# Patient Record
Sex: Female | Born: 1935
Health system: Southern US, Community
[De-identification: ages and names within clinical notes are randomized; demographics above are authoritative.]

## PROBLEM LIST (undated history)

## (undated) DIAGNOSIS — C55 Malignant neoplasm of uterus, part unspecified: Secondary | ICD-10-CM

## (undated) DIAGNOSIS — Z9889 Other specified postprocedural states: Secondary | ICD-10-CM

## (undated) DIAGNOSIS — J449 Chronic obstructive pulmonary disease, unspecified: Secondary | ICD-10-CM

## (undated) DIAGNOSIS — I4891 Unspecified atrial fibrillation: Secondary | ICD-10-CM

## (undated) DIAGNOSIS — Z8719 Personal history of other diseases of the digestive system: Secondary | ICD-10-CM

## (undated) DIAGNOSIS — J984 Other disorders of lung: Secondary | ICD-10-CM

## (undated) DIAGNOSIS — B019 Varicella without complication: Secondary | ICD-10-CM

## (undated) DIAGNOSIS — E119 Type 2 diabetes mellitus without complications: Secondary | ICD-10-CM

## (undated) DIAGNOSIS — K219 Gastro-esophageal reflux disease without esophagitis: Secondary | ICD-10-CM

## (undated) DIAGNOSIS — H409 Unspecified glaucoma: Secondary | ICD-10-CM

## (undated) DIAGNOSIS — N6019 Diffuse cystic mastopathy of unspecified breast: Secondary | ICD-10-CM

## (undated) DIAGNOSIS — K432 Incisional hernia without obstruction or gangrene: Secondary | ICD-10-CM

## (undated) HISTORY — DX: Malignant neoplasm of uterus, part unspecified: C55

## (undated) HISTORY — DX: Other disorders of lung: J98.4

## (undated) HISTORY — PX: NISSEN FUNDOPLICATION: SHX2091

## (undated) HISTORY — DX: Chronic obstructive pulmonary disease, unspecified: J44.9

## (undated) HISTORY — DX: Unspecified atrial fibrillation: I48.91

## (undated) HISTORY — DX: Other specified postprocedural states: Z98.890

## (undated) HISTORY — DX: Gastro-esophageal reflux disease without esophagitis: K21.9

## (undated) HISTORY — DX: Type 2 diabetes mellitus without complications: E11.9

## (undated) HISTORY — DX: Varicella without complication: B01.9

## (undated) HISTORY — DX: Personal history of other diseases of the digestive system: Z87.19

## (undated) HISTORY — PX: CATARACT EXTRACTION: SUR2

## (undated) HISTORY — DX: Diffuse cystic mastopathy of unspecified breast: N60.19

## (undated) HISTORY — DX: Unspecified glaucoma: H40.9

## (undated) HISTORY — DX: Incisional hernia without obstruction or gangrene: K43.2

## (undated) HISTORY — PX: BREAST BIOPSY: SHX20

---

## 1964-01-23 HISTORY — PX: HIATAL HERNIA REPAIR: SHX195

## 1964-01-23 HISTORY — PX: APPENDECTOMY: SHX54

## 1972-01-23 HISTORY — PX: TOTAL ABDOMINAL HYSTERECTOMY W/ BILATERAL SALPINGOOPHORECTOMY: SHX83

## 1973-01-22 HISTORY — PX: CHOLECYSTECTOMY: SHX55

## 1975-01-23 DIAGNOSIS — N6019 Diffuse cystic mastopathy of unspecified breast: Secondary | ICD-10-CM

## 1975-01-23 HISTORY — DX: Diffuse cystic mastopathy of unspecified breast: N60.19

## 1980-01-23 DIAGNOSIS — Z9889 Other specified postprocedural states: Secondary | ICD-10-CM

## 1980-01-23 HISTORY — DX: Other specified postprocedural states: Z98.890

## 2004-01-23 DIAGNOSIS — Z8719 Personal history of other diseases of the digestive system: Secondary | ICD-10-CM

## 2004-01-23 HISTORY — DX: Personal history of other diseases of the digestive system: Z87.19

## 2004-01-23 HISTORY — PX: HERNIA REPAIR: SHX51

## 2014-07-08 LAB — BASIC METABOLIC PANEL
CREATININE: 0.6 mg/dL (ref 0.5–1.1)
GLUCOSE: 105 mg/dL
POTASSIUM: 4.8 mmol/L (ref 3.4–5.3)
Sodium: 144 mmol/L (ref 137–147)

## 2014-07-08 LAB — HEPATIC FUNCTION PANEL
ALT: 15 U/L (ref 7–35)
AST: 12 U/L — AB (ref 13–35)

## 2014-07-08 LAB — HEMOGLOBIN A1C: HEMOGLOBIN A1C: 5.7 % (ref 4.0–6.0)

## 2014-07-08 LAB — LIPID PANEL
CHOLESTEROL: 164 mg/dL (ref 0–200)
HDL: 43 mg/dL (ref 35–70)
LDL Cholesterol: 89 mg/dL
Triglycerides: 162 mg/dL — AB (ref 40–160)

## 2014-07-08 LAB — CBC AND DIFFERENTIAL
HEMOGLOBIN: 12 g/dL (ref 12.0–16.0)
WBC: 7.7 10^3/mL

## 2014-07-08 LAB — COMPREHENSIVE METABOLIC PANEL: CALCIUM: 10.6

## 2014-07-08 LAB — TSH: TSH: 4.25 u[IU]/mL (ref 0.41–5.90)

## 2014-07-08 LAB — VITAMIN D 25 HYDROXY (VIT D DEFICIENCY, FRACTURES): Vit D, 25-Hydroxy: 26.1

## 2014-07-08 LAB — VITAMIN B12: VITAMIN B12: 471

## 2014-08-23 ENCOUNTER — Ambulatory Visit: Payer: Self-pay | Admitting: Family Medicine

## 2014-08-24 ENCOUNTER — Ambulatory Visit (INDEPENDENT_AMBULATORY_CARE_PROVIDER_SITE_OTHER): Payer: Commercial Managed Care - HMO | Admitting: Family Medicine

## 2014-08-24 ENCOUNTER — Encounter: Payer: Self-pay | Admitting: Family Medicine

## 2014-08-24 VITALS — BP 154/71 | HR 77 | Ht 61.5 in | Wt 209.0 lb

## 2014-08-24 DIAGNOSIS — Z8719 Personal history of other diseases of the digestive system: Secondary | ICD-10-CM

## 2014-08-24 DIAGNOSIS — J45909 Unspecified asthma, uncomplicated: Secondary | ICD-10-CM | POA: Insufficient documentation

## 2014-08-24 DIAGNOSIS — E119 Type 2 diabetes mellitus without complications: Secondary | ICD-10-CM

## 2014-08-24 DIAGNOSIS — Z9889 Other specified postprocedural states: Secondary | ICD-10-CM | POA: Insufficient documentation

## 2014-08-24 DIAGNOSIS — J4540 Moderate persistent asthma, uncomplicated: Secondary | ICD-10-CM

## 2014-08-24 DIAGNOSIS — J986 Disorders of diaphragm: Secondary | ICD-10-CM | POA: Diagnosis not present

## 2014-08-24 DIAGNOSIS — I370 Nonrheumatic pulmonary valve stenosis: Secondary | ICD-10-CM

## 2014-08-24 DIAGNOSIS — Z8711 Personal history of peptic ulcer disease: Secondary | ICD-10-CM

## 2014-08-24 DIAGNOSIS — H4010X0 Unspecified open-angle glaucoma, stage unspecified: Secondary | ICD-10-CM

## 2014-08-24 DIAGNOSIS — I2723 Pulmonary hypertension due to lung diseases and hypoxia: Secondary | ICD-10-CM | POA: Insufficient documentation

## 2014-08-24 MED ORDER — AMBULATORY NON FORMULARY MEDICATION: Status: DC

## 2014-08-24 MED ORDER — ALBUTEROL SULFATE HFA 108 (90 BASE) MCG/ACT IN AERS: INHALATION_SPRAY | RESPIRATORY_TRACT | Status: DC

## 2014-08-24 NOTE — Progress Notes (Signed)
CC: Becky Newton is a 81 y.o. female is here for Establish Care   Subjective: HPI:   very pleasant 81 year old here to establish care   Reports a history of asthma  Currently taking Asmanex or Advair, is not entirely clear  What she actually hasn't home with respect to this inhaler. Also using  Albuterol a few times a week.  She had mild shortness of breath and wheezing earlier in her life however after  Her hiatal hernia slipped into her left lung cavity and  Repair resulted in left hemidiaphragm paralysis she has required 2 L of oxygen around-the-clock along with  Inhaled corticosteroids. She believes symptoms are well controlled right now.   reports a history of type 2 diabetes and believes that her A1c was checked within the last 3 months. She is currently taking 500 mg of metformin daily. Denies polyuria polyphagia or polydipsia. No outside blood  Sugars to report but does request refills on  Testing strips.   Decades ago before a Nissen fundoplication  She had an EGD showing which she reports is over 25 gastric ulcers the largest being 15 cm in diameter.  Since then she's been taking a PPI on a daily basis, currently taking 20 mg of omeprazole twice a day. She denies any gastric reflux symptoms or epigastric discomfort.   She is requesting a referral to an ophthalmologist. She was diagnosed with open angle glaucoma,  Duration of this is unknown. She denies any new headache or vision loss.  Review of Systems - General ROS: negative for - chills, fever, night sweats, weight gain or weight loss Ophthalmic ROS: negative for - decreased vision Psychological ROS: negative for - anxiety or depression ENT ROS: negative for - hearing change, nasal congestion, tinnitus or allergies Hematological and Lymphatic ROS: negative for - bleeding problems, bruising or swollen lymph nodes Breast ROS: negative Respiratory ROS: no cough, shortness of breath, or wheezing Cardiovascular ROS: no chest pain or  dyspnea on exertion Gastrointestinal ROS: no abdominal pain, change in bowel habits, or black or bloody stools Genito-Urinary ROS: negative for - genital discharge, genital ulcers, incontinence or abnormal bleeding from genitals Musculoskeletal ROS: negative for - joint pain or muscle pain Neurological ROS: negative for - headaches or memory loss Dermatological ROS: negative for lumps, mole changes, rash and skin lesion changes  Past Medical History  Diagnosis Date  . Diabetes   . Fibrocystic breast determined by biopsy 1977    Past Surgical History  Procedure Laterality Date  . Hiatal hernia repair  1966  . Appendectomy  1966  . Total abdominal hysterectomy w/ bilateral salpingoophorectomy  1974  . Cataract extraction  [id number]  . Hernia repair  2006   Family History  Problem Relation Age of Onset  . Breast cancer Mother   . Diabetes Mother     History   Social History  . Marital Status: Married    Spouse Name: N/A  . Number of Children: N/A  . Years of Education: N/A   Occupational History  . Not on file.   Social History Main Topics  . Smoking status: Former Smoker    Quit date: 08/23/1989  . Smokeless tobacco: Not on file  . Alcohol Use: No  . Drug Use: No  . Sexual Activity:    Partners: Male   Other Topics Concern  . Not on file   Social History Narrative  . No narrative on file     Objective: BP 154/71 mmHg  Pulse 77  Ht 5' 1.5" (1.562 m)  Wt 209 lb (94.802 kg)  BMI 38.86 kg/m2  General: Alert and Oriented, No Acute Distress HEENT: Pupils equal, round, reactive to light. Conjunctivae clear.  Moist mucous membranes pharynx unremarkable Lungs: Clear to auscultation bilaterally, no wheezing/ronchi/rales.  Comfortable work of breathing. Good air movement. Cardiac: Regular rate and rhythm. Normal S1/S2.  No murmurs, rubs, nor gallops.   Abdomen:obese and soft Extremities: No peripheral edema.  Strong peripheral pulses.  Mental Status: No depression,  anxiety, nor agitation. Skin: Warm and dry.  Assessment & Plan: [name] was seen today for establish care.  Diagnoses and all orders for this visit:  Asthma, chronic, moderate persistent, uncomplicated Orders: -     albuterol (PROVENTIL HFA;VENTOLIN HFA) 108 (90 BASE) MCG/ACT inhaler; Inhale two puffs every 4-6 hours only as needed for shortness of breath or wheezing. -     Ambulatory referral to Pulmonology  Type 2 diabetes mellitus without complication  History of gastric ulcer  Paralysis, diaphragm  History of Nissen fundoplication  Pulmonary stenosis Orders: -     Ambulatory referral to Pulmonology  Open-angle glaucoma, stage unspecified Orders: -     Ambulatory referral to Ophthalmology  Other orders -     AMBULATORY NON FORMULARY MEDICATION; Accu-Chek SmartView glucose testing strips. Use to check blood sugar twice a day.  Type 2 diabetes.   Asthma: Controlled continue as needed albuterol, requested that she bring her medicines at next visit to clarify whether she is taking Asmanex or Advair Type 2 diabetes: Clinically controlled, requested outside records for most recent and historically A1c's. History of gastric ulcer: Currently controlled,asymptomatic continue omeprazole Pulmonary stenosis: Referral to pulmonology Glaucoma: Referral to ophthalmology  Return in about 3 months (around 11/24/2014).

## 2014-08-26 ENCOUNTER — Ambulatory Visit: Payer: Self-pay | Admitting: Family Medicine

## 2014-09-02 ENCOUNTER — Other Ambulatory Visit: Payer: Self-pay | Admitting: Family Medicine

## 2014-09-02 ENCOUNTER — Encounter: Payer: Self-pay | Admitting: Family Medicine

## 2014-09-02 DIAGNOSIS — G4733 Obstructive sleep apnea (adult) (pediatric): Secondary | ICD-10-CM | POA: Insufficient documentation

## 2014-09-02 DIAGNOSIS — J9610 Chronic respiratory failure, unspecified whether with hypoxia or hypercapnia: Secondary | ICD-10-CM | POA: Insufficient documentation

## 2014-09-02 DIAGNOSIS — J4540 Moderate persistent asthma, uncomplicated: Secondary | ICD-10-CM

## 2014-09-04 DIAGNOSIS — J449 Chronic obstructive pulmonary disease, unspecified: Secondary | ICD-10-CM | POA: Diagnosis not present

## 2014-09-26 ENCOUNTER — Emergency Department
Admission: EM | Admit: 2014-09-26 | Discharge: 2014-09-26 | Disposition: A | Discharge status: Home / Self Care | Payer: Commercial Managed Care - HMO | Source: Home / Self Care | Attending: Family Medicine | Admitting: Family Medicine

## 2014-09-26 ENCOUNTER — Encounter: Payer: Self-pay | Admitting: Emergency Medicine

## 2014-09-26 DIAGNOSIS — W1839XA Other fall on same level, initial encounter: Secondary | ICD-10-CM | POA: Diagnosis not present

## 2014-09-26 DIAGNOSIS — M79672 Pain in left foot: Secondary | ICD-10-CM

## 2014-09-26 DIAGNOSIS — W010XXA Fall on same level from slipping, tripping and stumbling without subsequent striking against object, initial encounter: Secondary | ICD-10-CM

## 2014-09-26 NOTE — ED Provider Notes (Signed)
CSN: [id number]     Arrival date & time 09/26/14  1656 History   First MD Initiated Contact with Patient 09/26/14 1710     Chief Complaint  Patient presents with  . Foot Injury   (Consider location/radiation/quality/duration/timing/severity/associated sxs/prior Treatment) HPI  Pt is a 81yo female presenting to [facility] with c/o Left foot pain and swelling that started around 12PM this afternoon after she tripped and fell on a curb on a sidewalk while going to lunch. Pt states she well back on her buttock.  Left foot has mild swelling and mild tenderness that is worse in 3rd toe. Pt states she does not believe it is broken. Denies taking pain medication PTA.  She has been icing her foot with moderate relief.  Pt is on 60m aspirin daily. Denies any other injuries.   Past Medical History  Diagnosis Date  . Diabetes   . Fibrocystic breast determined by biopsy 1977  . H/O left breast biopsy 1982  . H/O hernia repair 2006  . Incisional hernia   . S/P scar revision    Past Surgical History  Procedure Laterality Date  . Hiatal hernia repair  1966  . Appendectomy  1966  . Total abdominal hysterectomy w/ bilateral salpingoophorectomy  1974    hx of cancer   . Cataract extraction  [id number] . Hernia repair  2006   Family History  Problem Relation Age of Onset  . Breast cancer Mother   . Diabetes Mother    Social History  Substance Use Topics  . Smoking status: Former Smoker    Quit date: 08/23/1989  . Smokeless tobacco: None  . Alcohol Use: No   OB History    No data available     Review of Systems  Musculoskeletal: Positive for myalgias, joint swelling and arthralgias.       Left foot  Skin: Negative for color change and wound.    Allergies  Ivp dye  Home Medications   Prior to Admission medications   Medication Sig Start Date End Date Taking? Authorizing Provider  albuterol (PROVENTIL HFA;VENTOLIN HFA) 108 (90 BASE) MCG/ACT inhaler Inhale two puffs every 4-6 hours only as  needed for shortness of breath or wheezing. 08/24/14 08/24/15  Sean Hommel, DO  AMBULATORY NON FORMULARY MEDICATION Accu-Chek SmartView glucose testing strips. Use to check blood sugar twice a day.  Type 2 diabetes. 08/24/14   Sean Hommel, DO  Fluticasone-Salmeterol (ADVAIR DISKUS) 250-50 MCG/DOSE AEPB Inhale 1 puff into the lungs 2 (two) times daily. 09/02/14   Sean Hommel, DO  gabapentin (NEURONTIN) 300 MG capsule Take 300 mg by mouth 3 (three) times daily.    Historical Provider, MD  metFORMIN (GLUCOPHAGE) 500 MG tablet Take 500 mg by mouth daily with breakfast.    Historical Provider, MD  mometasone (ASMANEX) 220 MCG/INH inhaler Inhale 2 puffs into the lungs daily.    Historical Provider, MD  Multiple Vitamins-Minerals (CENTRUM SILVER ULTRA WOMENS PO) Take by mouth.    Historical Provider, MD  omeprazole (PRILOSEC) 20 MG capsule Take 20 mg by mouth 2 (two) times daily before a meal.    Historical Provider, MD  Polyethyl Glycol-Propyl Glycol (SYSTANE ULTRA OP) Apply to eye.    Historical Provider, MD  Travoprost, BAK Free, (TRAVATAN Z) 0.004 % SOLN ophthalmic solution 1 drop at bedtime.    Historical Provider, MD   Meds Ordered and Administered this Visit  Medications - No data to display  BP 149/71 mmHg  Pulse 71  Temp(Src) 97.6  F (36.4 C) (Oral)  SpO2 97% No data found.   Physical Exam  Constitutional: She is oriented to person, place, and time. She appears well-developed and well-nourished.  HENT:  Head: Normocephalic and atraumatic.  Eyes: EOM are normal.  Neck: Normal range of motion.  Cardiovascular: Normal rate.   Pulses:      Dorsalis pedis pulses are 2+ on the left side.  Left foot: cap refill < 3 seconds  Pulmonary/Chest: Effort normal.  Musculoskeletal: Normal range of motion. She exhibits edema and tenderness.  Left foot: mild edema over 2nd and 3rd metatarsal, mild tenderness over edema and base of 3rd toe. FROM Left ankle and toes. 5/5 strength with plantarflexion and  dorsiflexion.  Neurological: She is alert and oriented to person, place, and time.  Left foot: sensation normal.  Skin: Skin is warm and dry.  Left foot: mild edema and ecchymosis over 2nd and 3rd metatarsals, mild tenderness. Skin in tact. No warmth or erythema.  Psychiatric: She has a normal mood and affect. Her behavior is normal.  Nursing note and vitals reviewed.   ED Course  Procedures (including critical care time)  Labs Review Labs Reviewed - No data to display  Imaging Review No results found.    MDM   1. Left foot pain   2. Fall from slip, trip, or stumble, initial encounter     Pt c/o Left foot pain and swelling. Foot is neurovascularly in tact. Mild edema, ecchymosis and tenderness. FROM with 5/5 strength. Pt states she does not think it is broken and declined imaging at this time.   Encouraged pt to keep foot elevated and ice 2-3 times daily. Ace wrap applied in urgent care. Advised to f/u in Urgent Care or with PCP in 1 week if not improving as she may need plain films to r/o fracture if not improving. Patient verbalized understanding and agreement with treatment plan.     Noland Fordyce, PA-C 09/26/14 1718

## 2014-09-26 NOTE — ED Notes (Signed)
Left foot pain. Pt states she fell while getting out of the car today. States she is only having minimal discomfort.

## 2014-10-05 DIAGNOSIS — J449 Chronic obstructive pulmonary disease, unspecified: Secondary | ICD-10-CM | POA: Diagnosis not present

## 2014-10-14 ENCOUNTER — Ambulatory Visit (HOSPITAL_BASED_OUTPATIENT_CLINIC_OR_DEPARTMENT_OTHER)
Admission: RE | Admit: 2014-10-14 | Discharge: 2014-10-14 | Disposition: A | Discharge status: Home / Self Care | Payer: Commercial Managed Care - HMO | Source: Ambulatory Visit | Attending: Pulmonary Disease | Admitting: Pulmonary Disease

## 2014-10-14 ENCOUNTER — Encounter: Payer: Self-pay | Admitting: Pulmonary Disease

## 2014-10-14 ENCOUNTER — Ambulatory Visit (INDEPENDENT_AMBULATORY_CARE_PROVIDER_SITE_OTHER): Payer: Commercial Managed Care - HMO | Admitting: Pulmonary Disease

## 2014-10-14 VITALS — BP 124/70 | HR 73 | Ht 61.5 in | Wt 210.0 lb

## 2014-10-14 DIAGNOSIS — I517 Cardiomegaly: Secondary | ICD-10-CM | POA: Insufficient documentation

## 2014-10-14 DIAGNOSIS — J90 Pleural effusion, not elsewhere classified: Secondary | ICD-10-CM | POA: Diagnosis not present

## 2014-10-14 DIAGNOSIS — J4530 Mild persistent asthma, uncomplicated: Secondary | ICD-10-CM

## 2014-10-14 DIAGNOSIS — J986 Disorders of diaphragm: Secondary | ICD-10-CM

## 2014-10-14 DIAGNOSIS — J9611 Chronic respiratory failure with hypoxia: Secondary | ICD-10-CM | POA: Diagnosis not present

## 2014-10-14 DIAGNOSIS — I272 Other secondary pulmonary hypertension: Secondary | ICD-10-CM | POA: Diagnosis not present

## 2014-10-14 DIAGNOSIS — R918 Other nonspecific abnormal finding of lung field: Secondary | ICD-10-CM | POA: Insufficient documentation

## 2014-10-14 DIAGNOSIS — R0602 Shortness of breath: Secondary | ICD-10-CM | POA: Diagnosis not present

## 2014-10-14 DIAGNOSIS — I2723 Pulmonary hypertension due to lung diseases and hypoxia: Secondary | ICD-10-CM

## 2014-10-14 DIAGNOSIS — J984 Other disorders of lung: Secondary | ICD-10-CM

## 2014-10-14 NOTE — Assessment & Plan Note (Signed)
Continue 2 L oxygen on exertion and sleep

## 2014-10-14 NOTE — Assessment & Plan Note (Signed)
She will change from Advair to Asmanex Okay to use Ventolin instead of pro-air

## 2014-10-14 NOTE — Assessment & Plan Note (Signed)
Obtain chest x-ray today

## 2014-10-14 NOTE — Assessment & Plan Note (Signed)
Echo will be obtained to document pulmonary artery pressures. The seems to be secondary to her lung disease and oxygen as the main treatment

## 2014-10-14 NOTE — Patient Instructions (Signed)
CXR today Echo for pulmonary hypertension Call as needed

## 2014-10-14 NOTE — Progress Notes (Signed)
Subjective:    Patient ID: Becky Newton, female    DOB: Mar 02, 1933, 81 y.o.   MRN: [id number]  HPI   81 year old remote smoker who has moved from [place], presents to establish care. She is to follow-up with a pulmonologist there Dr Steele Berg for the following problems- 1. Chronic respiratory failure requiring oxygen since 2013. ONO 11/2011 on room air shows 4.5 hours of desaturation less than 89% 2. Left hemidiaphragm paralysis-documented on sniff test. She apparently had hiatal hernia surgery in 1976 and then some kind of diaphragmatic repair in the 90s by Dr. Arlyce Dice 3. Cough variant Asthma -maintained on Advair and pro-air 4. Mild OSA-AHI 12/hour, did not tolerate CPAP. 5. Pulmonary hypertension-no details available  She states that due to insurance issues she will be switching from Advair to Asmanex and from pro-air 2 Ventolin. She is compliant with oxygen and her son Elta Guadeloupe reports significant improvement in her dyspnea and exercise capacity since she went on oxygen few years ago. She denies cough or wheezing. She absolutely did not tolerate CPAP and does not want to try again. She denies pedal edema. Her immunizations are up-to-date She smoked about 25 pack years before she quit in 1991  CXr 10/2012-shows left lower lobe scarring  Past Medical History  Diagnosis Date  . Diabetes   . Fibrocystic breast determined by biopsy 1977  . H/O left breast biopsy 1982  . H/O hernia repair 2006  . Incisional hernia   . S/P scar revision     Past Surgical History  Procedure Laterality Date  . Hiatal hernia repair  1966  . Appendectomy  1966  . Total abdominal hysterectomy w/ bilateral salpingoophorectomy  1974    hx of cancer   . Cataract extraction  [id number]  . Hernia repair  2006   Allergies  Allergen Reactions  . Ivp Dye [Iodinated Diagnostic Agents]     Social History   Social History  . Marital Status: Married    Spouse Name: N/A  . Number of Children: N/A   . Years of Education: N/A   Occupational History  . Not on file.   Social History Main Topics  . Smoking status: Former Smoker    Quit date: 08/23/1989  . Smokeless tobacco: Not on file  . Alcohol Use: No  . Drug Use: No  . Sexual Activity:    Partners: Male   Other Topics Concern  . Not on file   Social History Narrative    Family History  Problem Relation Age of Onset  . Breast cancer Mother   . Diabetes Mother     Review of Systems  Constitutional: Negative for fever, chills and unexpected weight change.  HENT: Negative for congestion, dental problem, ear pain, nosebleeds, postnasal drip, rhinorrhea, sinus pressure, sneezing, sore throat, trouble swallowing and voice change.   Eyes: Negative for visual disturbance.  Respiratory: Positive for shortness of breath. Negative for cough and choking.   Cardiovascular: Negative for chest pain and leg swelling.  Gastrointestinal: Negative for vomiting, abdominal pain and diarrhea.  Genitourinary: Negative for difficulty urinating.  Musculoskeletal: Negative for arthralgias.  Skin: Negative for rash.  Neurological: Negative for tremors, syncope and headaches.  Hematological: Does not bruise/bleed easily.       Objective:   Physical Exam  Gen. Pleasant, obese, in no distress, normal affect ENT - no lesions, no post nasal drip, class 2-3 airway Neck: No JVD, no thyromegaly, no carotid bruits Lungs: no use of accessory muscles, no dullness  to percussion, decreased left base without rales or rhonchi  Cardiovascular: Rhythm regular, heart sounds  normal, no murmurs or gallops, no peripheral edema Abdomen: soft and non-tender, no hepatosplenomegaly, BS normal. Musculoskeletal: No deformities, no cyanosis or clubbing Neuro:  alert, non focal, no tremors       Assessment & Plan:

## 2014-10-15 ENCOUNTER — Other Ambulatory Visit: Payer: Self-pay | Admitting: Pulmonary Disease

## 2014-10-15 ENCOUNTER — Telehealth: Payer: Self-pay | Admitting: Pulmonary Disease

## 2014-10-15 MED ORDER — FUROSEMIDE 20 MG PO TABS: ORAL_TABLET | ORAL | Status: DC

## 2014-10-15 NOTE — Telephone Encounter (Signed)
FYI - patient's last surgery was 1998 with Dr. Rudi Heap  Patient called to give you this information. Nothing further needed.   FYI to Dr. Elsworth Soho

## 2014-10-16 NOTE — Telephone Encounter (Signed)
ok

## 2014-10-18 DIAGNOSIS — E119 Type 2 diabetes mellitus without complications: Secondary | ICD-10-CM | POA: Diagnosis not present

## 2014-10-18 DIAGNOSIS — H5210 Myopia, unspecified eye: Secondary | ICD-10-CM | POA: Diagnosis not present

## 2014-10-18 DIAGNOSIS — H5213 Myopia, bilateral: Secondary | ICD-10-CM | POA: Diagnosis not present

## 2014-10-18 LAB — HM DIABETES EYE EXAM

## 2014-10-19 DIAGNOSIS — I519 Heart disease, unspecified: Secondary | ICD-10-CM | POA: Diagnosis not present

## 2014-10-19 DIAGNOSIS — R072 Precordial pain: Secondary | ICD-10-CM | POA: Diagnosis not present

## 2014-10-19 DIAGNOSIS — I480 Paroxysmal atrial fibrillation: Secondary | ICD-10-CM | POA: Diagnosis not present

## 2014-10-19 DIAGNOSIS — H409 Unspecified glaucoma: Secondary | ICD-10-CM | POA: Diagnosis not present

## 2014-10-19 DIAGNOSIS — Z9981 Dependence on supplemental oxygen: Secondary | ICD-10-CM | POA: Diagnosis not present

## 2014-10-19 DIAGNOSIS — J449 Chronic obstructive pulmonary disease, unspecified: Secondary | ICD-10-CM | POA: Diagnosis not present

## 2014-10-19 DIAGNOSIS — I4891 Unspecified atrial fibrillation: Secondary | ICD-10-CM | POA: Diagnosis not present

## 2014-10-19 DIAGNOSIS — E1142 Type 2 diabetes mellitus with diabetic polyneuropathy: Secondary | ICD-10-CM | POA: Diagnosis not present

## 2014-10-19 DIAGNOSIS — N6019 Diffuse cystic mastopathy of unspecified breast: Secondary | ICD-10-CM | POA: Diagnosis not present

## 2014-10-19 DIAGNOSIS — J90 Pleural effusion, not elsewhere classified: Secondary | ICD-10-CM | POA: Diagnosis not present

## 2014-10-19 DIAGNOSIS — I088 Other rheumatic multiple valve diseases: Secondary | ICD-10-CM | POA: Diagnosis not present

## 2014-10-19 DIAGNOSIS — J9611 Chronic respiratory failure with hypoxia: Secondary | ICD-10-CM | POA: Diagnosis not present

## 2014-10-19 DIAGNOSIS — R918 Other nonspecific abnormal finding of lung field: Secondary | ICD-10-CM | POA: Diagnosis not present

## 2014-10-19 DIAGNOSIS — I517 Cardiomegaly: Secondary | ICD-10-CM | POA: Diagnosis not present

## 2014-10-20 ENCOUNTER — Other Ambulatory Visit (HOSPITAL_BASED_OUTPATIENT_CLINIC_OR_DEPARTMENT_OTHER): Payer: Commercial Managed Care - HMO

## 2014-10-22 ENCOUNTER — Encounter: Payer: Self-pay | Admitting: Family Medicine

## 2014-10-28 ENCOUNTER — Telehealth: Payer: Self-pay | Admitting: *Deleted

## 2014-10-28 MED ORDER — METFORMIN HCL 500 MG PO TABS: 500.0000 mg | ORAL_TABLET | Freq: Every day | ORAL | Status: DC

## 2014-10-28 NOTE — Telephone Encounter (Signed)
Pt is requesting metformin be sent to her pharm. Have to rout since we have not rx'd this for her yet

## 2014-10-28 NOTE — Telephone Encounter (Signed)
Pt notified

## 2014-10-28 NOTE — Telephone Encounter (Signed)
In her message she said she takes it once a day.

## 2014-10-28 NOTE — Telephone Encounter (Signed)
I'll need some clarification, in the [facility] system she's listed as taking 523m BID, in our system it's only 5044mdaily.  Can she let me know what she's actually taking?

## 2014-10-28 NOTE — Telephone Encounter (Signed)
90 day supply with refills sent to her [facility] mail order pharmacy.

## 2014-11-03 ENCOUNTER — Ambulatory Visit (INDEPENDENT_AMBULATORY_CARE_PROVIDER_SITE_OTHER): Payer: Commercial Managed Care - HMO | Admitting: Cardiology

## 2014-11-03 ENCOUNTER — Encounter: Payer: Self-pay | Admitting: Cardiology

## 2014-11-03 VITALS — BP 170/84 | HR 59 | Ht 61.5 in | Wt 211.4 lb

## 2014-11-03 DIAGNOSIS — I4891 Unspecified atrial fibrillation: Secondary | ICD-10-CM | POA: Diagnosis not present

## 2014-11-03 DIAGNOSIS — I480 Paroxysmal atrial fibrillation: Secondary | ICD-10-CM | POA: Diagnosis not present

## 2014-11-03 DIAGNOSIS — R079 Chest pain, unspecified: Secondary | ICD-10-CM

## 2014-11-03 DIAGNOSIS — R072 Precordial pain: Secondary | ICD-10-CM

## 2014-11-03 DIAGNOSIS — J986 Disorders of diaphragm: Secondary | ICD-10-CM

## 2014-11-03 NOTE — Assessment & Plan Note (Signed)
Patient had new onset atrial fibrillation recently.She converted to sinus. Echocardiogram showed normal LV function and TSH normal. Embolic risk factors include diabetes mellitus, female sex and age greater than 82. CHADS vasc 4. Continue xarelto. In 2 weeks check hemoglobin and renal function. Continue Toprol. If she has more frequent episodes in the future we will consider an antiarrhythmic.

## 2014-11-03 NOTE — Assessment & Plan Note (Signed)
Patient is on chronic oxygen for a paralyzed left hemidiaphragm, obesity hypoventilatory syndrome, obstructive sleep apnea and COPD. Management per primary care.

## 2014-11-03 NOTE — Assessment & Plan Note (Signed)
Patient had chest pain recently with her atrial fibrillation. She ruled out by serial enzymes. Plan nuclear study for risk stratification.

## 2014-11-03 NOTE — Patient Instructions (Signed)
Medication Instructions:   NO CHANGE  Labwork:  Your physician recommends that you return for lab work in: 2 WEEKS  Testing/Procedures:  Your physician has requested that you have a lexiscan myoview. For further information please visit [email or web address]. Please follow instruction sheet, as given.    Follow-Up:  Your physician recommends that you schedule a follow-up appointment in: [place]

## 2014-11-03 NOTE — Assessment & Plan Note (Signed)
Needs weight loss.

## 2014-11-03 NOTE — Progress Notes (Signed)
HPI: 81 year old female for evaluation of atrial fibrillation and chest pain. Patient was admitted to [facility] recently with new onset atrial fibrillation with rapid ventricular response. I do not have the electrocardiogram but I do have a discharge summary. She converted to sinus rhythm with Cardizem. Her enzymes were negative. She was ultimately discharged on Toprol and xarelto. TSH normal. Echocardiogram in [facility] September 2016 showed normal LV function, grade 2 diastolic dysfunction, moderate left atrial enlargement, mild tricuspid regurgitation and trace aortic insufficiency. Chest x-ray September 2016 showed probable edema and small effusions. Since discharge she has dyspnea on exertion but has no recurrent chest pain. Her only chest pain occurred with the atrial fibrillation/rapid ventricular response. Her dyspnea on exertion is chronic and she is on oxygen. No orthopnea, PND, pedal edema or syncope. No bleeding.  Current Outpatient Prescriptions  Medication Sig Dispense Refill  . AMBULATORY NON FORMULARY MEDICATION Accu-Chek SmartView glucose testing strips. Use to check blood sugar twice a day.  Type 2 diabetes. 100 Units 11  . Fluticasone-Salmeterol (ADVAIR DISKUS) 250-50 MCG/DOSE AEPB Inhale 1 puff into the lungs 2 (two) times daily. 60 each   . furosemide (LASIX) 20 MG tablet Take 1 tablet daily x 5 days. 5 tablet 0  . gabapentin (NEURONTIN) 300 MG capsule Take 300 mg by mouth at bedtime.     . Lutein ([facility] LUTEIN) 40 MG CAPS Take 1 capsule by mouth daily.    . metFORMIN (GLUCOPHAGE) 500 MG tablet Take 1 tablet (500 mg total) by mouth daily with breakfast. 90 tablet 2  . metoprolol succinate (TOPROL-XL) 25 MG 24 hr tablet Take 25 mg by mouth daily.    . mometasone (ASMANEX) 220 MCG/INH inhaler Inhale 2 puffs into the lungs daily.    . Multiple Vitamins-Minerals (CENTRUM SILVER ULTRA WOMENS PO) Take by mouth.    . Multiple Vitamins-Minerals (PRESERVISION AREDS PO)  Take 2 capsules by mouth 2 (two) times daily.    . Omega-3 Fatty Acids (FISH OIL) 1000 MG CAPS Take 1,000 mg by mouth daily.    Marland Kitchen omeprazole (PRILOSEC) 20 MG capsule Take 20 mg by mouth 2 (two) times daily before a meal.    . OXYGEN Inhale 2 L into the lungs.    Vladimir Faster Glycol-Propyl Glycol (SYSTANE ULTRA OP) Apply to eye.    . rivaroxaban (XARELTO) 20 MG TABS tablet Take 20 mg by mouth daily with supper.    . Travoprost, BAK Free, (TRAVATAN Z) 0.004 % SOLN ophthalmic solution 1 drop at bedtime.     No current facility-administered medications for this visit.    Allergies  Allergen Reactions  . Iodine Nausea And Vomiting  . Ivp Dye [Iodinated Diagnostic Agents]      Past Medical History  Diagnosis Date  . Diabetes ([place])   . Fibrocystic breast determined by biopsy 1977  . H/O left breast biopsy 1982  . H/O hernia repair 2006  . Incisional hernia   . S/P scar revision   . Lung disease     Paralyzed left hemidiaphragm  . COPD (chronic obstructive pulmonary disease) ([place])   . Atrial fibrillation [facility])     Past Surgical History  Procedure Laterality Date  . Hiatal hernia repair  1966  . Appendectomy  1966  . Total abdominal hysterectomy w/ bilateral salpingoophorectomy  1974    hx of cancer   . Cataract extraction  [id number]  . Hernia repair  2006  . Nissen fundoplication      Social History  Social History  . Marital Status: Married    Spouse Name: N/A  . Number of Children: N/A  . Years of Education: N/A   Occupational History  . Not on file.   Social History Main Topics  . Smoking status: Former Smoker    Quit date: 08/23/1989  . Smokeless tobacco: Not on file  . Alcohol Use: 0.0 oz/week    0 Standard drinks or equivalent per week     Comment: Occasional  . Drug Use: No  . Sexual Activity:    Partners: Male   Other Topics Concern  . Not on file   Social History Narrative    Family History  Problem Relation Age of Onset  . Breast cancer  Mother   . Diabetes Mother     ROS: no fevers or chills, productive cough, hemoptysis, dysphasia, odynophagia, melena, hematochezia, dysuria, hematuria, rash, seizure activity, orthopnea, PND, pedal edema, claudication. Remaining systems are negative.  Physical Exam:   Blood pressure 170/84, pulse 59, height 5' 1.5" (1.562 m), weight 95.89 kg (211 lb 6.4 oz).  General:  Well developed/obese in NAD Skin warm/dry Patient not depressed No peripheral clubbing Back-normal HEENT-normal/normal eyelids Neck supple/normal carotid upstroke bilaterally; no bruits; no JVD; no thyromegaly chest - CTA/ normal expansion CV - RRR/normal S1 and S2; no murmurs, rubs or gallops;  PMI nondisplaced Abdomen -NT/ND, no HSM, no mass, + bowel sounds, no bruit 2+ femoral pulses, no bruits Ext-no edema, chords, 2+ DP Neuro-grossly nonfocal  ECG Sinus bradycardia with no ST changes.

## 2014-11-04 DIAGNOSIS — J449 Chronic obstructive pulmonary disease, unspecified: Secondary | ICD-10-CM | POA: Diagnosis not present

## 2014-11-08 ENCOUNTER — Other Ambulatory Visit: Payer: Self-pay | Admitting: Cardiology

## 2014-11-08 MED ORDER — METOPROLOL SUCCINATE ER 25 MG PO TB24: 25.0000 mg | ORAL_TABLET | Freq: Every day | ORAL | Status: DC

## 2014-11-08 MED ORDER — RIVAROXABAN 20 MG PO TABS: 20.0000 mg | ORAL_TABLET | Freq: Every day | ORAL | Status: DC

## 2014-11-08 NOTE — Telephone Encounter (Signed)
STAT if patient is at the pharmacy , call can be transferred to refill team.   1. Which medications need to be refilled? Xarelto and Metoprolol  2. Which pharmacy/location is [facility] to[facility] [address]  3. Do they need a 30 day or 90 day supply? Did not say  * Pt wanted to know that since she takes Xarelto , can she discontinue the baby aspirin *

## 2014-11-08 NOTE — Telephone Encounter (Addendum)
Rx(s) sent to pharmacy electronically.  Per Dr [name], patient can discontinue aspirin. Patient notified.

## 2014-11-08 NOTE — Addendum Note (Signed)
Addended by: Diana Eves on: 11/08/2014 03:38 PM   Modules accepted: Orders

## 2014-11-09 DIAGNOSIS — I4891 Unspecified atrial fibrillation: Secondary | ICD-10-CM | POA: Diagnosis not present

## 2014-11-10 LAB — BASIC METABOLIC PANEL
BUN: 20 mg/dL (ref 7–25)
CHLORIDE: 104 mmol/L (ref 98–110)
CO2: 29 mmol/L (ref 20–31)
Calcium: 10.2 mg/dL (ref 8.6–10.4)
Creat: 0.59 mg/dL — ABNORMAL LOW (ref 0.60–0.93)
Glucose, Bld: 103 mg/dL — ABNORMAL HIGH (ref 65–99)
POTASSIUM: 4.2 mmol/L (ref 3.5–5.3)
SODIUM: 141 mmol/L (ref 135–146)

## 2014-11-10 LAB — CBC
HCT: 37 % (ref 36.0–46.0)
Hemoglobin: 11.8 g/dL — ABNORMAL LOW (ref 12.0–15.0)
MCH: 31.3 pg (ref 26.0–34.0)
MCHC: 31.9 g/dL (ref 30.0–36.0)
MCV: 98.1 fL (ref 78.0–100.0)
MPV: 12.9 fL — AB (ref 8.6–12.4)
PLATELETS: 201 10*3/uL (ref 150–400)
RBC: 3.77 MIL/uL — AB (ref 3.87–5.11)
RDW: 15.1 % (ref 11.5–15.5)
WBC: 8.2 10*3/uL (ref 4.0–10.5)

## 2014-11-15 ENCOUNTER — Encounter: Payer: Self-pay | Admitting: Cardiology

## 2014-11-17 ENCOUNTER — Ambulatory Visit: Payer: Commercial Managed Care - HMO | Admitting: Cardiology

## 2014-11-23 ENCOUNTER — Telehealth: Payer: Self-pay | Admitting: Cardiology

## 2014-11-23 NOTE — Telephone Encounter (Signed)
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 He has an appointment with the onchologist Friday this week. Will make dr [name] aware.

## 2014-11-23 NOTE — Telephone Encounter (Signed)
Patient just wanted you to know that her husband Becky Newton) went to the ENT and was diagnosed with cancer.

## 2014-12-02 ENCOUNTER — Encounter: Payer: Self-pay | Admitting: Family Medicine

## 2014-12-05 DIAGNOSIS — J449 Chronic obstructive pulmonary disease, unspecified: Secondary | ICD-10-CM | POA: Diagnosis not present

## 2014-12-07 ENCOUNTER — Encounter (HOSPITAL_COMMUNITY): Payer: Commercial Managed Care - HMO

## 2014-12-07 DIAGNOSIS — H40013 Open angle with borderline findings, low risk, bilateral: Secondary | ICD-10-CM | POA: Diagnosis not present

## 2014-12-15 ENCOUNTER — Encounter: Payer: Self-pay | Admitting: Family Medicine

## 2014-12-15 ENCOUNTER — Ambulatory Visit (INDEPENDENT_AMBULATORY_CARE_PROVIDER_SITE_OTHER): Payer: Commercial Managed Care - HMO | Admitting: Family Medicine

## 2014-12-15 VITALS — BP 127/65 | HR 65 | Wt 209.0 lb

## 2014-12-15 DIAGNOSIS — E119 Type 2 diabetes mellitus without complications: Secondary | ICD-10-CM | POA: Diagnosis not present

## 2014-12-15 DIAGNOSIS — J4530 Mild persistent asthma, uncomplicated: Secondary | ICD-10-CM

## 2014-12-15 DIAGNOSIS — I480 Paroxysmal atrial fibrillation: Secondary | ICD-10-CM

## 2014-12-15 DIAGNOSIS — D649 Anemia, unspecified: Secondary | ICD-10-CM

## 2014-12-15 LAB — CBC
HEMATOCRIT: 41.5 % (ref 36.0–46.0)
Hemoglobin: 13.3 g/dL (ref 12.0–15.0)
MCH: 32 pg (ref 26.0–34.0)
MCHC: 32 g/dL (ref 30.0–36.0)
MCV: 99.8 fL (ref 78.0–100.0)
MPV: 13.3 fL — ABNORMAL HIGH (ref 8.6–12.4)
Platelets: 232 10*3/uL (ref 150–400)
RBC: 4.16 MIL/uL (ref 3.87–5.11)
RDW: 14.5 % (ref 11.5–15.5)
WBC: 9.2 10*3/uL (ref 4.0–10.5)

## 2014-12-15 LAB — IRON AND TIBC
%SAT: 20 % (ref 11–50)
IRON: 66 ug/dL (ref 45–160)
TIBC: 334 ug/dL (ref 250–450)
UIBC: 268 ug/dL (ref 125–400)

## 2014-12-15 LAB — POCT GLYCOSYLATED HEMOGLOBIN (HGB A1C): Hemoglobin A1C: 5.8

## 2014-12-15 MED ORDER — ASPIRIN EC 81 MG PO TBEC: 81.0000 mg | DELAYED_RELEASE_TABLET | Freq: Every day | ORAL | Status: DC

## 2014-12-15 MED ORDER — AMBULATORY NON FORMULARY MEDICATION: Status: DC

## 2014-12-15 NOTE — Progress Notes (Signed)
CC: Becky Newton is a 81 y.o. female is here for Hyperglycemia   Subjective: HPI:  Follow-up asthma: Using Asmanex daily, denies any cough or wheezing. Still has shortness of breath most days of the week.   Follow-up type 2 diabetes: Taking metformin daily without any known side effects. She denies polyuria polyphagia or polydipsia. No outside blood sugars to report, she is requesting test strips to be refilled. Denies poorly healing wounds.  Follow-up atrial fibrillation: She denies any bleeding or bruising abnormalities.  denies any chest pain, irregular heart beat or rapid heartbeat. Denies lightheadedness. She tells me since being discharged in September she's had fatigue deficits out of character for her. She's had this in the past and she had a sleep study performed which was normal.  She stopped taking a baby aspirin last month because she believes that xarelto takes the place of this medication.  When she was discharged from the hospital in September she had a mild anemia.   Review Of Systems Outlined In HPI  Past Medical History  Diagnosis Date  . Diabetes ([place])   . Fibrocystic breast determined by biopsy 1977  . H/O left breast biopsy 1982  . H/O hernia repair 2006  . Incisional hernia   . S/P scar revision   . Lung disease     Paralyzed left hemidiaphragm  . COPD (chronic obstructive pulmonary disease) ([place])   . Atrial fibrillation [facility])     Past Surgical History  Procedure Laterality Date  . Hiatal hernia repair  1966  . Appendectomy  1966  . Total abdominal hysterectomy w/ bilateral salpingoophorectomy  1974    hx of cancer   . Cataract extraction  [id number]  . Hernia repair  2006  . Nissen fundoplication     Family History  Problem Relation Age of Onset  . Breast cancer Mother   . Diabetes Mother     Social History   Social History  . Marital Status: Married    Spouse Name: N/A  . Number of Children: N/A  . Years of Education: N/A   Occupational  History  . Not on file.   Social History Main Topics  . Smoking status: Former Smoker    Quit date: 08/23/1989  . Smokeless tobacco: Not on file  . Alcohol Use: 0.0 oz/week    0 Standard drinks or equivalent per week     Comment: Occasional  . Drug Use: No  . Sexual Activity:    Partners: Male   Other Topics Concern  . Not on file   Social History Narrative     Objective: BP 127/65 mmHg  Pulse 65  Wt 209 lb (94.802 kg)  SpO2 98%  General: Alert and Oriented, No Acute Distress HEENT: Pupils equal, round, reactive to light. Conjunctivae clear.  Moist mucous membranes pharynx unremarkable Lungs: Clear to auscultation bilaterally, no wheezing/ronchi/rales.  Comfortable work of breathing. Good air movement. Cardiac: Regular rate and rhythm. Normal S1/S2.  No murmurs, rubs, nor gallops.   Extremities: No peripheral edema.  Strong peripheral pulses.  Mental Status: No depression, anxiety, nor agitation. Skin: Warm and dry.  Assessment & Plan: Becky Newton was seen today for hyperglycemia.  Diagnoses and all orders for this visit:  Asthma, chronic, mild persistent, uncomplicated  Type 2 diabetes mellitus without complication, without long-term current use of insulin (HCC) -     POCT HgB A1C  Paroxysmal atrial fibrillation (HCC)  Anemia, unspecified anemia type -     Iron and TIBC -  CBC  Other orders -     AMBULATORY NON FORMULARY MEDICATION; Accu-Chek SmartView glucose testing strips. Use to check blood sugar twice a day.  Type 2 diabetes. -     aspirin EC 81 MG tablet; Take 1 tablet (81 mg total) by mouth daily.    asthma: Appears to be controlling concerned that maybe her shortness of breath is actually due to anemia therefore checking iron and CBC.   atrial fibrillation: Rate controlled with metoprolol, and asked her to restart her daily aspirin and was told otherwise by her cardiologist. continue Xarelto Type 2 diabetes: A1c of 5.8, controlled continue metformin  daily no need for adjustments.  Return in about 3 months (around 03/17/2015) for Sugar.

## 2014-12-30 ENCOUNTER — Encounter: Payer: Self-pay | Admitting: Pulmonary Disease

## 2014-12-30 ENCOUNTER — Ambulatory Visit (INDEPENDENT_AMBULATORY_CARE_PROVIDER_SITE_OTHER): Payer: Commercial Managed Care - HMO | Admitting: Pulmonary Disease

## 2014-12-30 VITALS — BP 150/77 | HR 64

## 2014-12-30 DIAGNOSIS — J9611 Chronic respiratory failure with hypoxia: Secondary | ICD-10-CM | POA: Diagnosis not present

## 2014-12-30 MED ORDER — FUROSEMIDE 20 MG PO TABS: 20.0000 mg | ORAL_TABLET | Freq: Every day | ORAL | Status: DC

## 2014-12-30 NOTE — Progress Notes (Signed)
   Subjective:    Patient ID: Becky Newton, female    DOB: 01/28/1933, 81 y.o.   MRN: [id number]  HPI 81 year old remote smoker who has moved from [facility], presents to establish care. She is to follow-up with a pulmonologist there Dr Steele Berg for the following problems- 1. Chronic respiratory failure requiring oxygen since 2013. ONO 11/2011 on room air shows 4.5 hours of desaturation less than 89% 2. Left hemidiaphragm paralysis-documented on sniff test. She apparently had hiatal hernia surgery in 1976 and then some kind of diaphragmatic repair in the '98 by Dr. Arlyce Dice 3. Cough variant Asthma -maintained on Advair and pro-air 4. Mild OSA-AHI 12/hour, did not tolerate CPAP. 5. Pulmonary hypertension-no details available  Due to insurance issues she switched from Advair to Asmanex and from pro-air 2 Ventolin. She is compliant with oxygen and her son Becky Newton reports significant improvement in her dyspnea and exercise capacity since she went on oxygen few years ago.  She absolutely did not tolerate CPAP and does not want to try again. Her immunizations are up-to-date She smoked about 25 pack years before she quit in 1991  12/30/2014  Chief Complaint  Patient presents with  . Follow-up    gets very low on breath, sometimes feels like she is going to pass out.    Breathing is worse ,she gets more out of breath on usual activities - rest or walking She uses 2L pulse on walking & 2L cont during sleep  desatn to 91 % on walking - felt dizzy She denies cough or wheezing. She denies pedal edema.   CXr 09/2014 - rt base scarring  Echo 07/3401- grade 2 diastolic dysfunction  Review of Systems neg for any significant sore throat, dysphagia, itching, sneezing, nasal congestion or excess/ purulent secretions, fever, chills, sweats, unintended wt loss, pleuritic or exertional cp, hempoptysis, orthopnea pnd or change in chronic leg swelling. Also denies presyncope, palpitations, heartburn,  abdominal pain, nausea, vomiting, diarrhea or change in bowel or urinary habits, dysuria,hematuria, rash, arthralgias, visual complaints, headache, numbness weakness or ataxia.     Objective:   Physical Exam  Gen. Pleasant, obese, in no distress ENT - no lesions, no post nasal drip Neck: No JVD, no thyromegaly, no carotid bruits Lungs: no use of accessory muscles, no dullness to percussion, decreased without rales or rhonchi  Cardiovascular: Rhythm regular, heart sounds  normal, no murmurs or gallops, no peripheral edema Musculoskeletal: No deformities, no cyanosis or clubbing , no tremors       Assessment & Plan:

## 2014-12-30 NOTE — Assessment & Plan Note (Addendum)
Unclear what is making her worse -she does not seem to have overt heart failure or pedal edema, she does seem to desaturate when walking. Is no evidence of bronchospasm or increased sputum production to suggest COPD flare Lasix 20 mg daily x 1 week Increase oxygen to 3L pulse when walking

## 2014-12-30 NOTE — Patient Instructions (Signed)
Lasix 20 mg daily x 1 week Increase oxygen to 3L pulse when walking

## 2014-12-31 ENCOUNTER — Encounter: Payer: Self-pay | Admitting: Cardiology

## 2015-01-04 DIAGNOSIS — J449 Chronic obstructive pulmonary disease, unspecified: Secondary | ICD-10-CM | POA: Diagnosis not present

## 2015-01-18 ENCOUNTER — Telehealth: Payer: Self-pay

## 2015-01-18 NOTE — Telephone Encounter (Signed)
Patient called requesting a referral to [place] for patients oxygen.  Becky Newton [phone].   Huey Romans needs to contact patients insurance.  Ref #[id number].

## 2015-02-02 ENCOUNTER — Ambulatory Visit: Payer: Commercial Managed Care - HMO | Admitting: Cardiology

## 2015-02-04 DIAGNOSIS — J449 Chronic obstructive pulmonary disease, unspecified: Secondary | ICD-10-CM | POA: Diagnosis not present

## 2015-02-16 ENCOUNTER — Telehealth: Payer: Self-pay

## 2015-02-16 ENCOUNTER — Ambulatory Visit: Payer: Commercial Managed Care - HMO | Admitting: Cardiology

## 2015-02-16 MED ORDER — RIVAROXABAN 20 MG PO TABS: 20.0000 mg | ORAL_TABLET | Freq: Every day | ORAL | Status: DC

## 2015-02-16 NOTE — Telephone Encounter (Signed)
Well this is a very important drug for her to take to reduce the risk of having a stroke given her atrial fibrillation. I'd recommend she continue to take it. If price is an issue I'd recommend she contact her insurance provider to see if there is a cheaper alternative such as Elliquis.

## 2015-02-16 NOTE — Telephone Encounter (Signed)
Let patient know the importance of taking the Xarelto, and she is going to call her insurance company to see if there is a cheaper alternative.

## 2015-02-16 NOTE — Telephone Encounter (Signed)
Pt is currently out of xarelto and wants to stop medication stating that its too costly.  Is it safe for her to stop this medication?  Can aspirin take the place of xarelto?

## 2015-02-17 ENCOUNTER — Telehealth: Payer: Self-pay

## 2015-02-17 ENCOUNTER — Other Ambulatory Visit: Payer: Self-pay

## 2015-02-17 MED ORDER — RIVAROXABAN 20 MG PO TABS: 20.0000 mg | ORAL_TABLET | Freq: Every day | ORAL | Status: DC

## 2015-02-17 NOTE — Telephone Encounter (Signed)
Pt called stating that she will continue taking xarelto she can only afford 30 day supply at a time. A 30 day supply has been sent to her [facility] on [address].

## 2015-03-07 DIAGNOSIS — J449 Chronic obstructive pulmonary disease, unspecified: Secondary | ICD-10-CM | POA: Diagnosis not present

## 2015-03-20 DIAGNOSIS — Z87442 Personal history of urinary calculi: Secondary | ICD-10-CM | POA: Diagnosis not present

## 2015-03-20 DIAGNOSIS — Z7901 Long term (current) use of anticoagulants: Secondary | ICD-10-CM | POA: Diagnosis not present

## 2015-03-20 DIAGNOSIS — J449 Chronic obstructive pulmonary disease, unspecified: Secondary | ICD-10-CM | POA: Diagnosis not present

## 2015-03-20 DIAGNOSIS — I480 Paroxysmal atrial fibrillation: Secondary | ICD-10-CM | POA: Diagnosis not present

## 2015-03-20 DIAGNOSIS — R918 Other nonspecific abnormal finding of lung field: Secondary | ICD-10-CM | POA: Diagnosis not present

## 2015-03-20 DIAGNOSIS — Z79899 Other long term (current) drug therapy: Secondary | ICD-10-CM | POA: Diagnosis not present

## 2015-03-20 DIAGNOSIS — Z7984 Long term (current) use of oral hypoglycemic drugs: Secondary | ICD-10-CM | POA: Diagnosis not present

## 2015-03-20 DIAGNOSIS — Z91041 Radiographic dye allergy status: Secondary | ICD-10-CM | POA: Diagnosis not present

## 2015-03-20 DIAGNOSIS — J90 Pleural effusion, not elsewhere classified: Secondary | ICD-10-CM | POA: Diagnosis not present

## 2015-03-20 DIAGNOSIS — I4891 Unspecified atrial fibrillation: Secondary | ICD-10-CM | POA: Diagnosis not present

## 2015-03-20 DIAGNOSIS — Z87891 Personal history of nicotine dependence: Secondary | ICD-10-CM | POA: Diagnosis not present

## 2015-03-20 DIAGNOSIS — E119 Type 2 diabetes mellitus without complications: Secondary | ICD-10-CM | POA: Diagnosis not present

## 2015-03-21 ENCOUNTER — Telehealth: Payer: Self-pay | Admitting: Cardiology

## 2015-03-21 NOTE — Telephone Encounter (Signed)
Spoke with pt, Follow up scheduled  

## 2015-03-21 NOTE — Telephone Encounter (Signed)
Patient went to [facility]  ER yesterday- She states heart rate in 150's  - there. Today 's rate is 79,Patient states.  Patient wanted to know if any medications or treatment is needed Patient aware will defer Dr [name]. And contact her back

## 2015-03-21 NOTE — Telephone Encounter (Signed)
Becky Newton was in the e/r on Last night for AFIB and is calling to see If Dr. [name] wants to change/add to her medication , Please call.  Thanks

## 2015-03-21 NOTE — Telephone Encounter (Signed)
Fu ov with me or pa; no change in meds [name]

## 2015-03-22 ENCOUNTER — Telehealth: Payer: Self-pay

## 2015-03-22 ENCOUNTER — Ambulatory Visit: Payer: Commercial Managed Care - HMO | Admitting: Family Medicine

## 2015-03-22 ENCOUNTER — Ambulatory Visit (INDEPENDENT_AMBULATORY_CARE_PROVIDER_SITE_OTHER): Payer: Commercial Managed Care - HMO | Admitting: Family Medicine

## 2015-03-22 ENCOUNTER — Encounter: Payer: Self-pay | Admitting: Family Medicine

## 2015-03-22 VITALS — BP 157/80 | HR 67 | Wt 213.0 lb

## 2015-03-22 DIAGNOSIS — J9611 Chronic respiratory failure with hypoxia: Secondary | ICD-10-CM

## 2015-03-22 DIAGNOSIS — J4530 Mild persistent asthma, uncomplicated: Secondary | ICD-10-CM

## 2015-03-22 DIAGNOSIS — E119 Type 2 diabetes mellitus without complications: Secondary | ICD-10-CM

## 2015-03-22 DIAGNOSIS — I480 Paroxysmal atrial fibrillation: Secondary | ICD-10-CM

## 2015-03-22 DIAGNOSIS — R0602 Shortness of breath: Secondary | ICD-10-CM

## 2015-03-22 LAB — POCT GLYCOSYLATED HEMOGLOBIN (HGB A1C): Hemoglobin A1C: 5.8

## 2015-03-22 MED ORDER — GABAPENTIN 300 MG PO CAPS: 600.0000 mg | ORAL_CAPSULE | Freq: Every day | ORAL | Status: DC

## 2015-03-22 MED ORDER — DILTIAZEM HCL ER COATED BEADS 120 MG PO CP24: 120.0000 mg | ORAL_CAPSULE | Freq: Every day | ORAL | Status: DC

## 2015-03-22 NOTE — Progress Notes (Signed)
CC: Becky Newton is a 82 y.o. female is here for Hyperglycemia   Subjective: HPI:  Requires 3L O2   on room air oxygen level is 80%.   She takes 3 L of oxygen to get her oxygen saturation  Above 88% both at rest and with exertion. She is using oxygen around the clock.  She denies any decline in her shortness of breath or cough.  She denies any wheezing or chest tightness. She is not currently using any inhaler  Or nebulized medication.   follow-up atrial fibrillation: She had to get  2 doses of diltiazem at the local emergency room last week for atrial fibrillation with rapid ventricular response at 150 bpm. She was resting when this occurred  And was accompanied by a flutter sensation in her chest and a rapid heartbeat. She denies any other episodes of irregular heartbeat or lightheadedness. Denies any motor or sensory disturbances.   Follow-up type 2 diabetes: No outside blood sugars to report. She's taking metoprolol  Metformin on a daily basis. Denies oliguria biphasic polydipsia or poorly healing wounds.   Review Of Systems Outlined In HPI  Past Medical History  Diagnosis Date  . Diabetes ([place])   . Fibrocystic breast determined by biopsy 1977  . H/O left breast biopsy 1982  . H/O hernia repair 2006  . Incisional hernia   . S/P scar revision   . Lung disease     Paralyzed left hemidiaphragm  . COPD (chronic obstructive pulmonary disease) ([place])   . Atrial fibrillation [facility])     Past Surgical History  Procedure Laterality Date  . Hiatal hernia repair  1966  . Appendectomy  1966  . Total abdominal hysterectomy w/ bilateral salpingoophorectomy  1974    hx of cancer   . Cataract extraction  [id number]  . Hernia repair  2006  . Nissen fundoplication     Family History  Problem Relation Age of Onset  . Breast cancer Mother   . Diabetes Mother     Social History   Social History  . Marital Status: Married    Spouse Name: N/A  . Number of Children: N/A  . Years of  Education: N/A   Occupational History  . Not on file.   Social History Main Topics  . Smoking status: Former Smoker    Quit date: 08/23/1989  . Smokeless tobacco: Not on file  . Alcohol Use: 0.0 oz/week    0 Standard drinks or equivalent per week     Comment: Occasional  . Drug Use: No  . Sexual Activity:    Partners: Male   Other Topics Concern  . Not on file   Social History Narrative     Objective: BP 157/80 mmHg  Pulse 67  Wt 213 lb (96.616 kg)  SpO2 80%  General: Alert and Oriented, No Acute Distress HEENT: Pupils equal, round, reactive to light. Conjunctivae clear.   Moist mucous membranes Lungs: Clear to auscultation bilaterally, no wheezing/ronchi/rales.  Comfortable work of breathing. Good air movement. Cardiac: Regular rate and rhythm. Normal S1/S2.  No murmurs, rubs, nor gallops.   Extremities: No peripheral edema.  Strong peripheral pulses.  Mental Status: No depression, anxiety, nor agitation. Skin: Warm and dry.  Assessment & Plan: Becky Newton was seen today for hyperglycemia.  Diagnoses and all orders for this visit:  Type 2 diabetes mellitus without complication, without long-term current use of insulin (HCC) -     POCT HgB A1C  Paroxysmal atrial fibrillation (HCC)  Chronic  respiratory failure with hypoxia (HCC)  Asthma, chronic, mild persistent, uncomplicated  Other orders -     gabapentin (NEURONTIN) 300 MG capsule; Take 2 capsules (600 mg total) by mouth at bedtime. -     diltiazem (CARDIZEM CD) 120 MG 24 hr capsule; Take 1 capsule (120 mg total) by mouth daily. To prevent atrial fibrillation.    technique 2 diabetes: A1c of 5.8, controlled  Therefore continue current dose of metformin  Asthma with chronic respiratory failure: Controlled with 3 L of oxygen around-the-clock.  Proximal atrial fibrillation:  Uncontrolled.  Continues on adequate anticoagulation.continue on  Metoprolol, adding diltiazem.  Return in about 3 months (around 06/19/2015)  for Sugar. Marland Kitchen

## 2015-03-22 NOTE — Progress Notes (Signed)
Subjective:    Patient ID: Becky Newton, female    DOB: 1933/04/03, 82 y.o.   MRN: [id number]  HPI  Becky Newton is here for spirometry. She has some shortness of breath.    Review of Systems     Objective:   Physical Exam        Assessment & Plan:  Shortness of breath - Spirometry preformed.

## 2015-03-22 NOTE — Telephone Encounter (Signed)
Husband notified

## 2015-03-22 NOTE — Telephone Encounter (Signed)
I'm willing to take over this for now.

## 2015-03-22 NOTE — Progress Notes (Signed)
Spirometery  Confirms reversible obstructive defect.

## 2015-03-22 NOTE — Telephone Encounter (Signed)
Pt would like to know are going to manage her breathing as well or should she continue to see Dr. Eartha Inch (may have missed that name) her that?

## 2015-03-23 ENCOUNTER — Ambulatory Visit: Payer: Commercial Managed Care - HMO | Admitting: Cardiology

## 2015-03-31 ENCOUNTER — Ambulatory Visit: Payer: Commercial Managed Care - HMO | Admitting: Adult Health

## 2015-04-04 DIAGNOSIS — J449 Chronic obstructive pulmonary disease, unspecified: Secondary | ICD-10-CM | POA: Diagnosis not present

## 2015-04-07 DIAGNOSIS — J90 Pleural effusion, not elsewhere classified: Secondary | ICD-10-CM | POA: Diagnosis not present

## 2015-04-07 DIAGNOSIS — Z7901 Long term (current) use of anticoagulants: Secondary | ICD-10-CM | POA: Diagnosis not present

## 2015-04-07 DIAGNOSIS — Z7951 Long term (current) use of inhaled steroids: Secondary | ICD-10-CM | POA: Diagnosis not present

## 2015-04-07 DIAGNOSIS — Z7984 Long term (current) use of oral hypoglycemic drugs: Secondary | ICD-10-CM | POA: Diagnosis not present

## 2015-04-07 DIAGNOSIS — I4891 Unspecified atrial fibrillation: Secondary | ICD-10-CM | POA: Diagnosis not present

## 2015-04-07 DIAGNOSIS — I509 Heart failure, unspecified: Secondary | ICD-10-CM | POA: Diagnosis not present

## 2015-04-07 DIAGNOSIS — R000 Tachycardia, unspecified: Secondary | ICD-10-CM | POA: Diagnosis not present

## 2015-04-07 DIAGNOSIS — Z91041 Radiographic dye allergy status: Secondary | ICD-10-CM | POA: Diagnosis not present

## 2015-04-07 DIAGNOSIS — Z87891 Personal history of nicotine dependence: Secondary | ICD-10-CM | POA: Diagnosis not present

## 2015-04-07 DIAGNOSIS — E119 Type 2 diabetes mellitus without complications: Secondary | ICD-10-CM | POA: Diagnosis not present

## 2015-04-07 DIAGNOSIS — J449 Chronic obstructive pulmonary disease, unspecified: Secondary | ICD-10-CM | POA: Diagnosis not present

## 2015-04-08 ENCOUNTER — Encounter: Payer: Self-pay | Admitting: Family Medicine

## 2015-04-08 ENCOUNTER — Ambulatory Visit (INDEPENDENT_AMBULATORY_CARE_PROVIDER_SITE_OTHER): Payer: Commercial Managed Care - HMO | Admitting: Family Medicine

## 2015-04-08 VITALS — BP 153/67 | HR 69

## 2015-04-08 DIAGNOSIS — K120 Recurrent oral aphthae: Secondary | ICD-10-CM

## 2015-04-08 DIAGNOSIS — H43813 Vitreous degeneration, bilateral: Secondary | ICD-10-CM | POA: Diagnosis not present

## 2015-04-08 DIAGNOSIS — I480 Paroxysmal atrial fibrillation: Secondary | ICD-10-CM

## 2015-04-08 MED ORDER — DILTIAZEM HCL ER COATED BEADS 180 MG PO CP24: 180.0000 mg | ORAL_CAPSULE | Freq: Every day | ORAL | Status: DC

## 2015-04-08 MED ORDER — TRIAMCINOLONE ACETONIDE 0.1 % MT PSTE: 1.0000 "application " | PASTE | Freq: Two times a day (BID) | OROMUCOSAL | Status: DC

## 2015-04-08 NOTE — Progress Notes (Signed)
CC: Becky Newton is a 82 y.o. female is here for Hospitalization Follow-up   Subjective: HPI:  Follow-up atrial fibrillation: Earlier this week she felt her heart rate was over 100 and took a baby aspirin along with an extra diltiazem. Since her heart rate was actually climbing somewhere around 140 she started to go to the emergency room with her husband. Soon after being brought into the emergency room she converted into normal sinus rhythm and due to unremarkable lab work she was sent home with instructions to take an extra dose of Lasix due to some pulmonary edema. She tells me she is feeling great ever since this episode and she has no complaints today other than a sore on her right lip that's been present for a few days now and it's tender to the touch. She would like another something other than Carmex to help relieve the pain. She denies chest pain, shortness of breath, orthopnea, peripheral edema, nor confusion. She denies any bleeding or bruising abnormalities.    Review Of Systems Outlined In HPI  Past Medical History  Diagnosis Date  . Diabetes ([place])   . Fibrocystic breast determined by biopsy 1977  . H/O left breast biopsy 1982  . H/O hernia repair 2006  . Incisional hernia   . S/P scar revision   . Lung disease     Paralyzed left hemidiaphragm  . COPD (chronic obstructive pulmonary disease) ([place])   . Atrial fibrillation [facility])     Past Surgical History  Procedure Laterality Date  . Hiatal hernia repair  1966  . Appendectomy  1966  . Total abdominal hysterectomy w/ bilateral salpingoophorectomy  1974    hx of cancer   . Cataract extraction  [id number]  . Hernia repair  2006  . Nissen fundoplication     Family History  Problem Relation Age of Onset  . Breast cancer Mother   . Diabetes Mother     Social History   Social History  . Marital Status: Married    Spouse Name: N/A  . Number of Children: N/A  . Years of Education: N/A   Occupational History  . Not  on file.   Social History Main Topics  . Smoking status: Former Smoker    Quit date: 08/23/1989  . Smokeless tobacco: Not on file  . Alcohol Use: 0.0 oz/week    0 Standard drinks or equivalent per week     Comment: Occasional  . Drug Use: No  . Sexual Activity:    Partners: Male   Other Topics Concern  . Not on file   Social History Narrative     Objective: BP 153/67 mmHg  Pulse 69  Wt   General: Alert and Oriented, No Acute Distress HEENT: Pupils equal, round, reactive to light. Conjunctivae clear. Canker sore in the corner of the right lower lip  Lungs: Clear to auscultation bilaterally, no wheezing/ronchi/rales.  Comfortable work of breathing. Good air movement. Cardiac: Regular rate and rhythm. Normal S1/S2.  No murmurs, rubs, nor gallops.   Extremities: No peripheral edema.  Strong peripheral pulses.  Mental Status: No depression, anxiety, nor agitation. Skin: Warm and dry.  Assessment & Plan: [name] was seen today for hospitalization follow-up.  Diagnoses and all orders for this visit:  Paroxysmal atrial fibrillation (HCC)  Canker sore -     triamcinolone (KENALOG) 0.1 % paste; Use as directed 1 application in the mouth or throat 2 (two) times daily.  Other orders -     diltiazem (  CARDIZEM CD) 180 MG 24 hr capsule; Take 1 capsule (180 mg total) by mouth daily.    atrial fibrillation: Uncontrolled chronic condition increasing diltiazem in hopes of preventing her from breaking and RVR again.   canker sore: Start triamcinolone cream on an as-needed basis  Return if symptoms worsen or fail to improve.

## 2015-05-05 DIAGNOSIS — J449 Chronic obstructive pulmonary disease, unspecified: Secondary | ICD-10-CM | POA: Diagnosis not present

## 2015-05-10 ENCOUNTER — Other Ambulatory Visit: Payer: Self-pay

## 2015-05-10 MED ORDER — METFORMIN HCL 500 MG PO TABS: 500.0000 mg | ORAL_TABLET | Freq: Every day | ORAL | Status: DC

## 2015-06-04 DIAGNOSIS — J449 Chronic obstructive pulmonary disease, unspecified: Secondary | ICD-10-CM | POA: Diagnosis not present

## 2015-06-10 ENCOUNTER — Other Ambulatory Visit: Payer: Self-pay

## 2015-06-10 NOTE — Telephone Encounter (Signed)
Opened in error

## 2015-06-14 ENCOUNTER — Telehealth: Payer: Self-pay

## 2015-06-14 MED ORDER — AMBULATORY NON FORMULARY MEDICATION: Status: DC

## 2015-06-14 NOTE — Telephone Encounter (Signed)
Rhonda, Rx placed in in-box ready for pickup/faxing.

## 2015-06-14 NOTE — Telephone Encounter (Signed)
Patient request lancets.Becky Newton,CMA

## 2015-06-21 ENCOUNTER — Ambulatory Visit (INDEPENDENT_AMBULATORY_CARE_PROVIDER_SITE_OTHER): Payer: Commercial Managed Care - HMO | Admitting: Family Medicine

## 2015-06-21 ENCOUNTER — Encounter: Payer: Self-pay | Admitting: Family Medicine

## 2015-06-21 VITALS — BP 169/73 | HR 64

## 2015-06-21 DIAGNOSIS — J4530 Mild persistent asthma, uncomplicated: Secondary | ICD-10-CM

## 2015-06-21 DIAGNOSIS — N3281 Overactive bladder: Secondary | ICD-10-CM

## 2015-06-21 DIAGNOSIS — I480 Paroxysmal atrial fibrillation: Secondary | ICD-10-CM | POA: Diagnosis not present

## 2015-06-21 DIAGNOSIS — E119 Type 2 diabetes mellitus without complications: Secondary | ICD-10-CM | POA: Diagnosis not present

## 2015-06-21 LAB — POCT GLYCOSYLATED HEMOGLOBIN (HGB A1C): HEMOGLOBIN A1C: 6

## 2015-06-21 MED ORDER — MIRABEGRON ER 50 MG PO TB24: 50.0000 mg | ORAL_TABLET | Freq: Every day | ORAL | Status: DC

## 2015-06-21 MED ORDER — ALBUTEROL SULFATE HFA 108 (90 BASE) MCG/ACT IN AERS: INHALATION_SPRAY | RESPIRATORY_TRACT | Status: DC

## 2015-06-21 MED ORDER — METFORMIN HCL 500 MG PO TABS: 500.0000 mg | ORAL_TABLET | Freq: Two times a day (BID) | ORAL | Status: DC

## 2015-06-21 NOTE — Addendum Note (Signed)
Addended by: Delrae Alfred on: 06/21/2015 09:45 AM   Modules accepted: Orders, SmartSet

## 2015-06-21 NOTE — Progress Notes (Signed)
CC: Becky Newton is a 82 y.o. female is here for Hyperglycemia; Hypertension; Medication Management; Insomnia; and Over Active Bladder   Subjective: HPI:  Follow-up asthma: She updates me today that she's been taking albuterol a few times a week for wheezing. She'll use medications effective. She denies any cough, shortness of breath provided she uses her oxygen around-the-clock. She denies any chest discomfort.  Follow-up type 2 diabetes: She tells that her former regimen of metformin was 500 mg twice a day. Since moving to [facility] she's only been taking 500 mg daily and notices that her fasting blood sugar covers around 135. Since changing to only 1 a day she's also noticed that a sensation of tingling and numbness in both feet are worsened. She denies any polyuria did see her or vision disturbance.  Follow-up atrial fibrillation: Since I saw her last and diltiazem was changed she denies any irregular heart beat or rapid heartbeat.  Her major complaint today is a sensation of urinary urgency. She's even had a bad enough to where she had an accident out in public a few weeks ago. She's tried a number of over-the-counter overactive bladder medications but nothing seems to be effective. She denies any dysuria or fever nor flank pain   Review Of Systems Outlined In HPI  Past Medical History  Diagnosis Date  . Diabetes ([place])   . Fibrocystic breast determined by biopsy 1977  . H/O left breast biopsy 1982  . H/O hernia repair 2006  . Incisional hernia   . S/P scar revision   . Lung disease     Paralyzed left hemidiaphragm  . COPD (chronic obstructive pulmonary disease) ([place])   . Atrial fibrillation [facility])     Past Surgical History  Procedure Laterality Date  . Hiatal hernia repair  1966  . Appendectomy  1966  . Total abdominal hysterectomy w/ bilateral salpingoophorectomy  1974    hx of cancer   . Cataract extraction  [id number]  . Hernia repair  2006  . Nissen fundoplication      Family History  Problem Relation Age of Onset  . Breast cancer Mother   . Diabetes Mother     Social History   Social History  . Marital Status: Married    Spouse Name: N/A  . Number of Children: N/A  . Years of Education: N/A   Occupational History  . Not on file.   Social History Main Topics  . Smoking status: Former Smoker    Quit date: 08/23/1989  . Smokeless tobacco: Not on file  . Alcohol Use: 0.0 oz/week    0 Standard drinks or equivalent per week     Comment: Occasional  . Drug Use: No  . Sexual Activity:    Partners: Male   Other Topics Concern  . Not on file   Social History Narrative     Objective: BP 169/73 mmHg  Pulse 64  Wt   General: Alert and Oriented, No Acute Distress HEENT: Pupils equal, round, reactive to light. Conjunctivae clear.  Moist mucous membranes Lungs: Clear to auscultation bilaterally, no wheezing/ronchi/rales.  Comfortable work of breathing. Good air movement. Cardiac: Regular rate and rhythm. Normal S1/S2.  No murmurs, rubs, nor gallops.   Extremities: No peripheral edema.  Strong peripheral pulses.  Mental Status: No depression, anxiety, nor agitation. Skin: Warm and dry.  Assessment & Plan: Becky Newton was seen today for hyperglycemia, hypertension, medication management, insomnia and over active bladder.  Diagnoses and all orders for this visit:  Asthma, chronic, mild persistent, uncomplicated -     albuterol (PROVENTIL HFA;VENTOLIN HFA) 108 (90 Base) MCG/ACT inhaler; Inhale two puffs every 4-6 hours only as needed for shortness of breath or wheezing.  Type 2 diabetes mellitus without complication, without long-term current use of insulin (HCC) -     metFORMIN (GLUCOPHAGE) 500 MG tablet; Take 1 tablet (500 mg total) by mouth 2 (two) times daily with a meal.  Paroxysmal atrial fibrillation (HCC)  Overactive bladder -     mirabegron ER (MYRBETRIQ) 50 MG TB24 tablet; Take 1 tablet (50 mg total) by mouth daily.   Asthma:  Controlled. Use of albuterol Type 2 diabetes: A1c 6.0, controlled however going back to her former regimen of 500 mg of metformin twice a day due to worsening diabetic neuropathy Atrial fibrillation: Currently anticoagulated and rate controlled. Overactive bladder: Begin trial of Mybetriq    Return in about 3 months (around 09/21/2015) for Sugar and Bladder.

## 2015-06-22 ENCOUNTER — Encounter: Payer: Self-pay | Admitting: Family Medicine

## 2015-07-05 DIAGNOSIS — J449 Chronic obstructive pulmonary disease, unspecified: Secondary | ICD-10-CM | POA: Diagnosis not present

## 2015-07-12 ENCOUNTER — Other Ambulatory Visit: Payer: Self-pay

## 2015-07-12 MED ORDER — OMEPRAZOLE 20 MG PO CPDR: 20.0000 mg | DELAYED_RELEASE_CAPSULE | Freq: Two times a day (BID) | ORAL | Status: DC

## 2015-07-18 ENCOUNTER — Encounter: Payer: Self-pay | Admitting: Family Medicine

## 2015-07-18 ENCOUNTER — Telehealth: Payer: Self-pay | Admitting: Family Medicine

## 2015-07-18 ENCOUNTER — Ambulatory Visit (INDEPENDENT_AMBULATORY_CARE_PROVIDER_SITE_OTHER): Payer: Commercial Managed Care - HMO | Admitting: Family Medicine

## 2015-07-18 VITALS — BP 137/76 | HR 73

## 2015-07-18 DIAGNOSIS — M7989 Other specified soft tissue disorders: Secondary | ICD-10-CM

## 2015-07-18 MED ORDER — FUROSEMIDE 20 MG PO TABS: 20.0000 mg | ORAL_TABLET | Freq: Two times a day (BID) | ORAL | Status: DC

## 2015-07-18 NOTE — Telephone Encounter (Signed)
Will you please let patient that there was no blood clot seen , this is great news, I'd recommend she increase her lasix regimen to 16m twice a day and f/u with me in one week if not working for the swelling.

## 2015-07-18 NOTE — Progress Notes (Signed)
CC: Becky Newton is a 82 y.o. female is here for Edema   Subjective: HPI:  For the past week she's noticed that she's having some left leg swelling. Mild in the morning and moderate in the evening. No benefit from elevation of the legs. No response to furosemide. Has not been bothering the right sided leg.  Since the worsen the toes and the foot slightly involving the ankle. No recent change in diet. She's also had some mild redness at the site of swelling. The swelling is painful. Symptoms have not been overall getting better or worse since onset. Denies new shortness of breath, cough, chest discomfort or swelling elsewhere.   Review Of Systems Outlined In HPI  Past Medical History  Diagnosis Date  . Diabetes ([place])   . Fibrocystic breast determined by biopsy 1977  . H/O left breast biopsy 1982  . H/O hernia repair 2006  . Incisional hernia   . S/P scar revision   . Lung disease     Paralyzed left hemidiaphragm  . COPD (chronic obstructive pulmonary disease) ([place])   . Atrial fibrillation [facility] [place])     Past Surgical History  Procedure Laterality Date  . Hiatal hernia repair  1966  . Appendectomy  1966  . Total abdominal hysterectomy w/ bilateral salpingoophorectomy  1974    hx of cancer   . Cataract extraction  [id number]  . Hernia repair  2006  . Nissen fundoplication     Family History  Problem Relation Age of Onset  . Breast cancer Mother   . Diabetes Mother     Social History   Social History  . Marital Status: Married    Spouse Name: N/A  . Number of Children: N/A  . Years of Education: N/A   Occupational History  . Not on file.   Social History Main Topics  . Smoking status: Former Smoker    Quit date: 08/23/1989  . Smokeless tobacco: Not on file  . Alcohol Use: 0.0 oz/week    0 Standard drinks or equivalent per week     Comment: Occasional  . Drug Use: No  . Sexual Activity:    Partners: Male   Other Topics Concern  . Not on file   Social History  Narrative     Objective: BP 137/76 mmHg  Pulse 73  Wt   General: Alert and Oriented, No Acute Distress HEENT: Pupils equal, round, reactive to light. Conjunctivae clear. Moist mucous membranes  Lungs: Clear to auscultation bilaterally, no wheezing/ronchi/rales.  Comfortable work of breathing. Good air movement. Cardiac: Regular rate and rhythm. Normal S1/S2.  No murmurs, rubs, nor gallops.   Extremities: No peripheral edema in the right leg however mild to moderate swelling in the left lower charity from the ankle distally..  Strong peripheral pulses.  Mental Status: No depression, anxiety, nor agitation. Skin: Warm and dry. Mild erythema in the left foot involving plantar and dorsal aspect  Assessment & Plan: Becky Newton was seen today for edema.  Diagnoses and all orders for this visit:  Left leg swelling -     US Venous Img Lower Unilateral Left   Left leg swelling: Would be incredibly unlikely for her to get a DVT while taking xarelto, however just in case she is accidentally not taking this I would like to get a venous ultrasound to rule out DVT one 100 percent.  Expect to have results back by the end of the day, next step would be increasing Lasix if DVT is ruled  out.Signs and symptoms requring emergent/urgent reevaluation were discussed with the patient.  Return if symptoms worsen or fail to improve.

## 2015-07-18 NOTE — Telephone Encounter (Signed)
Husband notified  

## 2015-07-29 ENCOUNTER — Telehealth: Payer: Self-pay

## 2015-07-29 DIAGNOSIS — E876 Hypokalemia: Secondary | ICD-10-CM

## 2015-07-29 NOTE — Addendum Note (Signed)
Addended by: Delrae Alfred on: 07/29/2015 04:22 PM   Modules accepted: Orders

## 2015-07-29 NOTE — Telephone Encounter (Signed)
Pt notified

## 2015-07-29 NOTE — Telephone Encounter (Signed)
Call pt: ok to go back down to 11m daily since swelling improved. Can she come today to have potassium drawn or at least Monday order BMP. Then will make that call.

## 2015-07-29 NOTE — Telephone Encounter (Signed)
Pt was seen on 07/18/15 and she was instructed to double up on her lasix.  She reports that her swelling has gone down.   Her questions are can she go back to taking 20 mg of lasix and should she take a potassium supplement as well? She stated in the past her potassium became low from lasix.  Should she have lab work done?  Please advise.

## 2015-08-01 DIAGNOSIS — E876 Hypokalemia: Secondary | ICD-10-CM | POA: Diagnosis not present

## 2015-08-01 LAB — BASIC METABOLIC PANEL
BUN: 21 mg/dL (ref 7–25)
CO2: 34 mmol/L — ABNORMAL HIGH (ref 20–31)
CREATININE: 0.77 mg/dL (ref 0.60–0.93)
Calcium: 9.1 mg/dL (ref 8.6–10.4)
Chloride: 102 mmol/L (ref 98–110)
Glucose, Bld: 123 mg/dL — ABNORMAL HIGH (ref 65–99)
Potassium: 4.8 mmol/L (ref 3.5–5.3)
Sodium: 142 mmol/L (ref 135–146)

## 2015-08-04 DIAGNOSIS — J449 Chronic obstructive pulmonary disease, unspecified: Secondary | ICD-10-CM | POA: Diagnosis not present

## 2015-08-12 ENCOUNTER — Telehealth: Payer: Self-pay

## 2015-08-12 NOTE — Telephone Encounter (Signed)
Pt reports that her co-pay for xarelto has increased and she can no longer afford it.  Is there something else that could be Rx for her? Please advise.

## 2015-08-12 NOTE — Telephone Encounter (Signed)
Pt notified

## 2015-08-12 NOTE — Telephone Encounter (Signed)
It depends, can she check to see if she's in the donut hole? If so all other options will be similarly expensive.  If not in the donut hole can she try to get ahold of her insurance prescription formulary to share with me so I can find which anticoagulant medications are preferred and cheapest

## 2015-08-19 ENCOUNTER — Encounter: Payer: Self-pay | Admitting: Family Medicine

## 2015-08-19 ENCOUNTER — Ambulatory Visit (INDEPENDENT_AMBULATORY_CARE_PROVIDER_SITE_OTHER): Payer: Commercial Managed Care - HMO | Admitting: Family Medicine

## 2015-08-19 VITALS — BP 138/65 | HR 69

## 2015-08-19 DIAGNOSIS — I8312 Varicose veins of left lower extremity with inflammation: Secondary | ICD-10-CM | POA: Diagnosis not present

## 2015-08-19 DIAGNOSIS — I480 Paroxysmal atrial fibrillation: Secondary | ICD-10-CM

## 2015-08-19 DIAGNOSIS — I872 Venous insufficiency (chronic) (peripheral): Secondary | ICD-10-CM

## 2015-08-19 DIAGNOSIS — R600 Localized edema: Secondary | ICD-10-CM

## 2015-08-19 DIAGNOSIS — N3281 Overactive bladder: Secondary | ICD-10-CM | POA: Diagnosis not present

## 2015-08-19 MED ORDER — FUROSEMIDE 20 MG PO TABS: 20.0000 mg | ORAL_TABLET | Freq: Two times a day (BID) | ORAL | 1 refills | Status: DC

## 2015-08-19 MED ORDER — RIVAROXABAN 20 MG PO TABS: 20.0000 mg | ORAL_TABLET | Freq: Every day | ORAL | 1 refills | Status: DC

## 2015-08-19 MED ORDER — TRIAMCINOLONE ACETONIDE 0.1 % EX CREA: TOPICAL_CREAM | CUTANEOUS | 0 refills | Status: DC

## 2015-08-19 NOTE — Progress Notes (Signed)
CC: Becky Newton is a 82 y.o. female is here for Medication Management   Subjective: HPI:   follow-up atrial fibrillaton:  She is under the impression that xarelto  Is going to cost  Her over $100 a month Her pharmacist with [employer]  Suggested that she switch to Coumadin. She wants to  Know what I think.   She wants know if she should still be taking Mybetric.  She thinks she needs to go up on lasix but is worried Mybetric will stop working. She doesn't want to "confuse her bladder". She denies any urinary leakage, incontinence, or urinary frequency.  Complains of a return of her left leg swelling.  Mild in the morning and moderate in the evening.  Somewhat painful on the top of her foot where she's developed a slightly red and raised rash. She's trying to treat this with using baby oil. She denies any irregular heartbeat, chest discomfort or new shortness of breath.  Review Of Systems Outlined In HPI  Past Medical History:  Diagnosis Date  . Atrial fibrillation ([place])   . COPD (chronic obstructive pulmonary disease) ([place])   . Diabetes ([place])   . Fibrocystic breast determined by biopsy 1977  . H/O hernia repair 2006  . H/O left breast biopsy 1982  . Incisional hernia   . Lung disease    Paralyzed left hemidiaphragm  . S/P scar revision     Past Surgical History:  Procedure Laterality Date  . APPENDECTOMY  1966  . CATARACT EXTRACTION  2004,2005  . HERNIA REPAIR  2006  . HIATAL HERNIA REPAIR  1966  . NISSEN FUNDOPLICATION    . TOTAL ABDOMINAL HYSTERECTOMY W/ BILATERAL SALPINGOOPHORECTOMY  1974   hx of cancer    Family History  Problem Relation Age of Onset  . Breast cancer Mother   . Diabetes Mother     Social History   Social History  . Marital status: Married    Spouse name: N/A  . Number of children: N/A  . Years of education: N/A   Occupational History  . Not on file.   Social History Main Topics  . Smoking status: Former Smoker    Quit date: 08/23/1989  .  Smokeless tobacco: Not on file  . Alcohol use 0.0 oz/week     Comment: Occasional  . Drug use: No  . Sexual activity: Yes    Partners: Male   Other Topics Concern  . Not on file   Social History Narrative  . No narrative on file     Objective: BP 138/65   Pulse 69   General: Alert and Oriented, No Acute Distress HEENT: Pupils equal, round, reactive to light. Conjunctivae clear.  Moist mucous membranes Lungs: Clear to auscultation bilaterally, no wheezing/ronchi/rales.  Comfortable work of breathing. Good air movement. Cardiac: Regular rate and rhythm. Normal S1/S2.  No murmurs, rubs, nor gallops.   Extremities: 1+ pitting edema in the left leg, trace in the right leg  Strong peripheral pulses.  Mental Status: No depression, anxiety, nor agitation. Skin: Warm and dry. Mild venous stasis dermatitis on the top of the left foot  Assessment & Plan: Ninoska was seen today for medication management.  Diagnoses and all orders for this visit:  Paroxysmal atrial fibrillation (HCC)  Overactive bladder  Bilateral edema of lower extremity  Venous stasis dermatitis of left lower extremity  Other orders -     rivaroxaban (XARELTO) 20 MG TABS tablet; Take 1 tablet (20 mg total) by mouth daily  with supper. -     furosemide (LASIX) 20 MG tablet; Take 1 tablet (20 mg total) by mouth 2 (two) times daily. -     triamcinolone cream (KENALOG) 0.1 %; Apply to affected areas twice a day for up to two weeks, avoid face.   Atrial fibrillation: We discussed the risks and benefits of Xarelto versus Coumadin, I let her know that Xarelto in my mind is a much safer medication. After talking for a while we wonder if maybe this price was given to her because she was [facility] at [facility] and not via [facility]. I sent in a prescription to mail order and if it's still unaffordable we discussed starting on Coumadin with frequent INRs until we find the right dose. Overactive bladder: We discussed how  mybetric and furosemide were different organs and going up on the furosemide to help with her left lower extremity edema will not cause any problems with mybetric other than some increased urination. Start triamcinolone cream to the venous stasis dermatitis   Return in about 4 weeks (around 09/16/2015).

## 2015-08-23 ENCOUNTER — Other Ambulatory Visit: Payer: Self-pay

## 2015-08-23 ENCOUNTER — Telehealth: Payer: Self-pay

## 2015-08-23 DIAGNOSIS — I480 Paroxysmal atrial fibrillation: Secondary | ICD-10-CM

## 2015-08-23 MED ORDER — WARFARIN SODIUM 3 MG PO TABS: 3.0000 mg | ORAL_TABLET | Freq: Every day | ORAL | 0 refills | Status: DC

## 2015-08-23 MED ORDER — WARFARIN SODIUM 3 MG PO TABS: 3.0000 mg | ORAL_TABLET | Freq: Every day | ORAL | 1 refills | Status: DC

## 2015-08-23 NOTE — Telephone Encounter (Signed)
PT NOTIFIED

## 2015-08-23 NOTE — Telephone Encounter (Signed)
Coumadin will be much more affordable.  I've sent a Rx for Coumadin 35m tablets [facility]  This will replace the daily xarelto dose.  I'd recommend she come in for a nurse visit one week after starting Coumadin to check and INR to see if the coumadin dose needs any adjustment.

## 2015-08-24 ENCOUNTER — Telehealth: Payer: Self-pay

## 2015-08-24 ENCOUNTER — Other Ambulatory Visit: Payer: Self-pay

## 2015-08-24 MED ORDER — TOLTERODINE TARTRATE ER 4 MG PO CP24: 4.0000 mg | ORAL_CAPSULE | Freq: Every day | ORAL | 0 refills | Status: DC

## 2015-08-24 MED ORDER — TOLTERODINE TARTRATE ER 4 MG PO CP24: 4.0000 mg | ORAL_CAPSULE | Freq: Every day | ORAL | 2 refills | Status: DC

## 2015-08-24 NOTE — Telephone Encounter (Signed)
Pt notified Rx sent to [facility]

## 2015-08-24 NOTE — Telephone Encounter (Signed)
Another alternative is Detrol LA, [facility] [place] so they can see how much it might cost.

## 2015-09-04 DIAGNOSIS — J449 Chronic obstructive pulmonary disease, unspecified: Secondary | ICD-10-CM | POA: Diagnosis not present

## 2015-09-10 ENCOUNTER — Other Ambulatory Visit: Payer: Self-pay | Admitting: Family Medicine

## 2015-09-12 ENCOUNTER — Other Ambulatory Visit: Payer: Self-pay | Admitting: Family Medicine

## 2015-09-18 ENCOUNTER — Other Ambulatory Visit: Payer: Self-pay | Admitting: Family Medicine

## 2015-09-20 ENCOUNTER — Ambulatory Visit (INDEPENDENT_AMBULATORY_CARE_PROVIDER_SITE_OTHER): Payer: Commercial Managed Care - HMO | Admitting: Family Medicine

## 2015-09-20 ENCOUNTER — Encounter: Payer: Self-pay | Admitting: Family Medicine

## 2015-09-20 VITALS — BP 127/66 | HR 66

## 2015-09-20 DIAGNOSIS — I480 Paroxysmal atrial fibrillation: Secondary | ICD-10-CM

## 2015-09-20 DIAGNOSIS — E119 Type 2 diabetes mellitus without complications: Secondary | ICD-10-CM

## 2015-09-20 DIAGNOSIS — N3281 Overactive bladder: Secondary | ICD-10-CM | POA: Diagnosis not present

## 2015-09-20 DIAGNOSIS — J4530 Mild persistent asthma, uncomplicated: Secondary | ICD-10-CM

## 2015-09-20 DIAGNOSIS — Z23 Encounter for immunization: Secondary | ICD-10-CM | POA: Diagnosis not present

## 2015-09-20 LAB — POCT GLYCOSYLATED HEMOGLOBIN (HGB A1C): HEMOGLOBIN A1C: 6

## 2015-09-20 MED ORDER — BUDESONIDE-FORMOTEROL FUMARATE 80-4.5 MCG/ACT IN AERO: 2.0000 | INHALATION_SPRAY | Freq: Two times a day (BID) | RESPIRATORY_TRACT | 0 refills | Status: DC

## 2015-09-20 MED ORDER — TRIAMCINOLONE ACETONIDE 0.1 % EX CREA
TOPICAL_CREAM | CUTANEOUS | 0 refills | Status: DC
Start: 2015-09-20 — End: 2016-03-05

## 2015-09-20 NOTE — Progress Notes (Signed)
CC: Becky Newton is a 82 y.o. female is here for Hyperglycemia (3 month A1c) and Over Active Bladder (4 week f/u)   Subjective: HPI:  Overactive bladder: She is able to afford Detrol and it seems to be helping. She is no longer having to use the bathroom as frequently. She has no genitourinary complaints today.  Follow-up atrial fibrillation: She is taking the ties them on a daily basis with no rapid heartbeat or irregular heartbeat. She started on Coumadin yesterday without any new side effects. She denies any new motor or sensory disturbances  Follow-up asthma: She's been having to use her albuterol on a daily basis for the last 3 days. She feels that her wheezing is worsening from baseline. She denies any shortness of breath or cough.  Type 2 diabetes: She is taking metformin with 100% compliance. No outside blood sugars to report. No vision disturbance    Review Of Systems Outlined In HPI  Past Medical History:  Diagnosis Date  . Atrial fibrillation ([place])   . COPD (chronic obstructive pulmonary disease) ([place])   . Diabetes ([place])   . Fibrocystic breast determined by biopsy 1977  . H/O hernia repair 2006  . H/O left breast biopsy 1982  . Incisional hernia   . Lung disease    Paralyzed left hemidiaphragm  . S/P scar revision     Past Surgical History:  Procedure Laterality Date  . APPENDECTOMY  1966  . CATARACT EXTRACTION  2004,2005  . HERNIA REPAIR  2006  . HIATAL HERNIA REPAIR  1966  . NISSEN FUNDOPLICATION    . TOTAL ABDOMINAL HYSTERECTOMY W/ BILATERAL SALPINGOOPHORECTOMY  1974   hx of cancer    Family History  Problem Relation Age of Onset  . Breast cancer Mother   . Diabetes Mother     Social History   Social History  . Marital status: Married    Spouse name: N/A  . Number of children: N/A  . Years of education: N/A   Occupational History  . Not on file.   Social History Main Topics  . Smoking status: Former Smoker    Quit date: 08/23/1989  . Smokeless  tobacco: Not on file  . Alcohol use 0.0 oz/week     Comment: Occasional  . Drug use: No  . Sexual activity: Yes    Partners: Male   Other Topics Concern  . Not on file   Social History Narrative  . No narrative on file     Objective: BP 127/66   Pulse 66   General: Alert and Oriented, No Acute Distress HEENT: Pupils equal, round, reactive to light. Conjunctivae clear.Moist mucous membranes  Lungs:Comfortable work of breathing with trace and asked for wheezing in the left lung fields, no rales or rhonchi  Cardiac: Regular rate and rhythm. Normal S1/S2.  No murmurs, rubs, nor gallops.   Extremities: No peripheral edema.  Strong peripheral pulses.  Mental Status: No depression, anxiety, nor agitation. Skin: Warm and dry.  Assessment & Plan: Becky Newton was seen today for hyperglycemia and over active bladder.  Diagnoses and all orders for this visit:  Overactive bladder  Paroxysmal atrial fibrillation ([place])  Type 2 diabetes mellitus without complication, without long-term current use of insulin (HCC)  Other orders -     triamcinolone cream (KENALOG) 0.1 %; Apply to affected areas twice a day for up to two weeks, avoid face.   Overactive bladder controlled with Detrol Atrial fibrillation currently rate controlled, she'll need to return in one  week for an INR check continue Coumadin daily Type 2 diabetes: A1c of 6.0, controlled continue metformin daily Asthma: Uncontrolled chronic condition adding Symbicort samples please call if a formal prescription is desired   Return for INR Nurse Visit in One Week.

## 2015-09-27 ENCOUNTER — Ambulatory Visit (INDEPENDENT_AMBULATORY_CARE_PROVIDER_SITE_OTHER): Payer: Commercial Managed Care - HMO | Admitting: Family Medicine

## 2015-09-27 DIAGNOSIS — I480 Paroxysmal atrial fibrillation: Secondary | ICD-10-CM

## 2015-09-27 LAB — POCT INR: INR: 1.1

## 2015-09-27 MED ORDER — WARFARIN SODIUM 4 MG PO TABS: 4.0000 mg | ORAL_TABLET | Freq: Every day | ORAL | 1 refills | Status: DC

## 2015-09-27 NOTE — Progress Notes (Signed)
Vm left for pt notifying her of med changes

## 2015-09-27 NOTE — Progress Notes (Signed)
Will you please let patient know that her INR reflects that her warfarin dose is too low and I'd recommend increasing the dose to 90m daily and repeat INR in one week. New Rx was sent to her pharmacy

## 2015-10-03 ENCOUNTER — Ambulatory Visit (INDEPENDENT_AMBULATORY_CARE_PROVIDER_SITE_OTHER): Payer: Commercial Managed Care - HMO | Admitting: Family Medicine

## 2015-10-03 DIAGNOSIS — I480 Paroxysmal atrial fibrillation: Secondary | ICD-10-CM

## 2015-10-03 LAB — POCT INR: INR: 1.5

## 2015-10-04 ENCOUNTER — Other Ambulatory Visit: Payer: Self-pay

## 2015-10-04 DIAGNOSIS — I480 Paroxysmal atrial fibrillation: Secondary | ICD-10-CM

## 2015-10-04 MED ORDER — WARFARIN SODIUM 6 MG PO TABS
ORAL_TABLET | ORAL | 0 refills | Status: DC
Start: 2015-10-04 — End: 2015-10-21

## 2015-10-04 NOTE — Progress Notes (Signed)
Pt advised of dosage change. Requested new Rx for 74m tabs, Rx sent. Pt will call for appt in 2 weeks.

## 2015-10-05 DIAGNOSIS — J449 Chronic obstructive pulmonary disease, unspecified: Secondary | ICD-10-CM | POA: Diagnosis not present

## 2015-10-15 ENCOUNTER — Other Ambulatory Visit: Payer: Self-pay | Admitting: Family Medicine

## 2015-10-17 ENCOUNTER — Ambulatory Visit (INDEPENDENT_AMBULATORY_CARE_PROVIDER_SITE_OTHER): Payer: Commercial Managed Care - HMO | Admitting: Physician Assistant

## 2015-10-17 DIAGNOSIS — I480 Paroxysmal atrial fibrillation: Secondary | ICD-10-CM | POA: Diagnosis not present

## 2015-10-17 LAB — POCT INR: INR: 1.5

## 2015-10-17 NOTE — Progress Notes (Signed)
Pt advised of dosage change, follow up appt made.

## 2015-10-17 NOTE — Progress Notes (Signed)
Increase coumadin to 1.5 tabs on Monday, Wednesday and friday. Then 85m all other days.  Return 2 weeks.

## 2015-10-21 ENCOUNTER — Other Ambulatory Visit: Payer: Self-pay

## 2015-10-21 DIAGNOSIS — I480 Paroxysmal atrial fibrillation: Secondary | ICD-10-CM

## 2015-10-21 MED ORDER — WARFARIN SODIUM 6 MG PO TABS: ORAL_TABLET | ORAL | 1 refills | Status: DC

## 2015-10-24 ENCOUNTER — Ambulatory Visit: Payer: Commercial Managed Care - HMO | Admitting: Physician Assistant

## 2015-10-31 ENCOUNTER — Other Ambulatory Visit: Payer: Self-pay | Admitting: *Deleted

## 2015-10-31 ENCOUNTER — Ambulatory Visit (INDEPENDENT_AMBULATORY_CARE_PROVIDER_SITE_OTHER): Payer: Commercial Managed Care - HMO | Admitting: Family Medicine

## 2015-10-31 DIAGNOSIS — I480 Paroxysmal atrial fibrillation: Secondary | ICD-10-CM

## 2015-10-31 LAB — POCT INR: INR: 1.9

## 2015-10-31 MED ORDER — WARFARIN SODIUM 4 MG PO TABS: 4.0000 mg | ORAL_TABLET | Freq: Every day | ORAL | 1 refills | Status: DC

## 2015-10-31 MED ORDER — WARFARIN SODIUM 6 MG PO TABS: ORAL_TABLET | ORAL | 1 refills | Status: DC

## 2015-10-31 NOTE — Progress Notes (Signed)
Pt notified of new dosing instructions & to return in 2 weeks.

## 2015-11-04 DIAGNOSIS — J449 Chronic obstructive pulmonary disease, unspecified: Secondary | ICD-10-CM | POA: Diagnosis not present

## 2015-11-14 ENCOUNTER — Ambulatory Visit (INDEPENDENT_AMBULATORY_CARE_PROVIDER_SITE_OTHER): Payer: Commercial Managed Care - HMO | Admitting: Physician Assistant

## 2015-11-14 ENCOUNTER — Encounter: Payer: Self-pay | Admitting: Physician Assistant

## 2015-11-14 VITALS — BP 130/52 | HR 58

## 2015-11-14 DIAGNOSIS — R600 Localized edema: Secondary | ICD-10-CM

## 2015-11-14 DIAGNOSIS — I480 Paroxysmal atrial fibrillation: Secondary | ICD-10-CM

## 2015-11-14 DIAGNOSIS — I2723 Pulmonary hypertension due to lung diseases and hypoxia: Secondary | ICD-10-CM

## 2015-11-14 DIAGNOSIS — N3281 Overactive bladder: Secondary | ICD-10-CM | POA: Diagnosis not present

## 2015-11-14 LAB — POCT INR: INR: 1.6

## 2015-11-14 MED ORDER — WARFARIN SODIUM 6 MG PO TABS: ORAL_TABLET | ORAL | 1 refills | Status: DC

## 2015-11-14 MED ORDER — WARFARIN SODIUM 4 MG PO TABS: 4.0000 mg | ORAL_TABLET | Freq: Every day | ORAL | 1 refills | Status: DC

## 2015-11-14 MED ORDER — MIRABEGRON ER 50 MG PO TB24
50.0000 mg | ORAL_TABLET | Freq: Every day | ORAL | 1 refills | Status: DC
Start: 2015-11-14 — End: 2015-12-26

## 2015-11-14 NOTE — Progress Notes (Signed)
Subjective:    Patient ID: Becky Newton, female    DOB: 07/11/1933, 82 y.o.   MRN: [id number]  HPI  Pt is a 82 yo female who presents to the clinic to establish care after Dr. Ileene Rubens left practice.   .. Active Ambulatory Problems    Diagnosis Date Noted  . Asthma, chronic 08/24/2014  . Type 2 diabetes mellitus ([place]) 08/24/2014  . History of gastric ulcer 08/24/2014  . Paralysis, diaphragm 08/24/2014  . History of Nissen fundoplication 20/94/7096  . Pulmonary hypertension due to lung disease 08/24/2014  . Open-angle glaucoma 08/24/2014  . Chronic respiratory failure ([place]) 09/02/2014  . OSA (obstructive sleep apnea) 09/02/2014  . Hypercalcemia 09/02/2014  . Paroxysmal atrial fibrillation ([place]) 11/03/2014  . Chest pain 11/03/2014  . Morbid obesity ([place]) 11/03/2014  . Overactive bladder 08/19/2015  . Lower extremity edema 08/19/2015  . Stasis dermatitis 08/19/2015   Resolved Ambulatory Problems    Diagnosis Date Noted  . No Resolved Ambulatory Problems   Past Medical History:  Diagnosis Date  . Atrial fibrillation ([place])   . COPD (chronic obstructive pulmonary disease) ([place])   . Diabetes ([place])   . Fibrocystic breast determined by biopsy 1977  . H/O hernia repair 2006  . H/O left breast biopsy 1982  . Incisional hernia   . Lung disease   . S/P scar revision    .Marland Kitchen Family History  Problem Relation Age of Onset  . Breast cancer Mother   . Diabetes Mother    .Marland Kitchen Social History   Social History  . Marital status: Married    Spouse name: N/A  . Number of children: N/A  . Years of education: N/A   Occupational History  . Not on file.   Social History Main Topics  . Smoking status: Former Smoker    Quit date: 08/23/1989  . Smokeless tobacco: Not on file  . Alcohol use 0.0 oz/week     Comment: Occasional  . Drug use: No  . Sexual activity: Yes    Partners: Male   Other Topics Concern  . Not on file   Social History Narrative  . No narrative on file    Pulmonologist Dr. Elsworth Soho.   Pt comes in for INR and to meet myself.  She is on coumadin for A. Fib.   She continues to have left lower extermity edema in her left foot for the last 3 months. She is taking lasix but not noticed a lot of difference in that extremity. Dr. Ileene Rubens did a full work up before he left with ultrasound to exclude DVT. Pt has not tried compression stockings. No pain, redness. At times tender to touch.   Pt was also given detrol for OAB. She does not feel like helping and giving her dry mouth and constipation. She would like to try something else.    Review of Systems  All other systems reviewed and are negative.      Objective:   Physical Exam  Constitutional:  Obese on continuous O2.   HENT:  Head: Normocephalic and atraumatic.  Neck: No JVD present.  Cardiovascular: Normal rate, regular rhythm and normal heart sounds.   Pulmonary/Chest: Effort normal and breath sounds normal.  Neurological:  1 plus pitting edema of left forefoot. No erythema, bruising. Mildly tender to palpation.   Psychiatric: She has a normal mood and affect. Her behavior is normal.          Assessment & Plan:  Marland KitchenMarland KitchenDiagnoses and all orders for  this visit:  Paroxysmal atrial fibrillation (HCC) -     POCT INR -     warfarin (COUMADIN) 4 MG tablet; Take 1 tablet (4 mg total) by mouth daily. On Tuesday and Thursday. Discontinue 40m formulation. -     warfarin (COUMADIN) 6 MG tablet; Take 624mon Sunday, Monday, Wednesday and Friday and Saturday.  Overactive bladder -     mirabegron ER (MYRBETRIQ) 50 MG TB24 tablet; Take 1 tablet (50 mg total) by mouth daily.  Lower extremity edema  Pulmonary hypertension due to lung disease   PAF- increased due to INR of 1.6 to 24m29mvery day except 4mg45m Tuesday and Thursday.   OAB- stop detrol due to failure and side effects. Start myrbetriq.   Lower extremity edema- work up done 2 months ago. Would like for patient to try compression  stockings. Follow up in 2 weeks.

## 2015-11-21 ENCOUNTER — Other Ambulatory Visit: Payer: Self-pay | Admitting: Physician Assistant

## 2015-11-21 DIAGNOSIS — Z0100 Encounter for examination of eyes and vision without abnormal findings: Secondary | ICD-10-CM

## 2015-11-22 ENCOUNTER — Encounter: Payer: Self-pay | Admitting: Physician Assistant

## 2015-11-22 DIAGNOSIS — E119 Type 2 diabetes mellitus without complications: Secondary | ICD-10-CM | POA: Diagnosis not present

## 2015-11-22 LAB — HM DIABETES EYE EXAM

## 2015-11-27 ENCOUNTER — Emergency Department
Admission: EM | Admit: 2015-11-27 | Discharge: 2015-11-27 | Disposition: A | Discharge status: Home / Self Care | Payer: Commercial Managed Care - HMO | Source: Home / Self Care | Attending: Family Medicine | Admitting: Family Medicine

## 2015-11-27 ENCOUNTER — Emergency Department (INDEPENDENT_AMBULATORY_CARE_PROVIDER_SITE_OTHER): Payer: Commercial Managed Care - HMO

## 2015-11-27 DIAGNOSIS — M1711 Unilateral primary osteoarthritis, right knee: Secondary | ICD-10-CM

## 2015-11-27 DIAGNOSIS — S8011XA Contusion of right lower leg, initial encounter: Secondary | ICD-10-CM | POA: Diagnosis not present

## 2015-11-27 DIAGNOSIS — M7989 Other specified soft tissue disorders: Secondary | ICD-10-CM | POA: Diagnosis not present

## 2015-11-27 DIAGNOSIS — R600 Localized edema: Secondary | ICD-10-CM

## 2015-11-27 DIAGNOSIS — S8991XA Unspecified injury of right lower leg, initial encounter: Secondary | ICD-10-CM

## 2015-11-27 MED ORDER — ONDANSETRON 4 MG PO TBDP
4.0000 mg | ORAL_TABLET | Freq: Once | ORAL | Status: AC
  Administered 2015-11-27: 4 mg via ORAL

## 2015-11-27 MED ORDER — HYDROCODONE-ACETAMINOPHEN 5-325 MG PO TABS: 1.0000 | ORAL_TABLET | Freq: Four times a day (QID) | ORAL | 0 refills | Status: DC | PRN

## 2015-11-27 MED ORDER — ONDANSETRON HCL 4 MG PO TABS: 4.0000 mg | ORAL_TABLET | Freq: Four times a day (QID) | ORAL | 0 refills | Status: DC

## 2015-11-27 NOTE — ED Triage Notes (Signed)
Patient states that she tripped and fell over a suit case last Saturday 11/19/15, right leg/foot bruised and swollen.

## 2015-11-27 NOTE — ED Provider Notes (Signed)
CSN: [id number]     Arrival date & time 11/27/15  1439 History   First MD Initiated Contact with Patient 11/27/15 1500     Chief Complaint  Patient presents with  . Leg Injury   (Consider location/radiation/quality/duration/timing/severity/associated sxs/prior Treatment) HPI Becky Newton is a 82 y.o. female presenting to UC with husband for evaluation of Right leg injury that occurred about 1 week ago after she tripped over a suitcase at home.  No other injuries during the fall. Pt is on coumadin for afib and daily oxygen. Denies chest pain or SOB. She notes she does have significant bruising and swelling to her Right lower leg but has been able to bear weight and ambulate since immediately after injury.  Bruising has improved significantly but she is still having moderate to severe pain to the lateral and anterior aspect of Right lower leg.  Pain is 10/10 at this time.  Mild associated nausea.    Past Medical History:  Diagnosis Date  . Atrial fibrillation ([place])   . COPD (chronic obstructive pulmonary disease) ([place])   . Diabetes ([place])   . Fibrocystic breast determined by biopsy 1977  . H/O hernia repair 2006  . H/O left breast biopsy 1982  . Incisional hernia   . Lung disease    Paralyzed left hemidiaphragm  . S/P scar revision    Past Surgical History:  Procedure Laterality Date  . APPENDECTOMY  1966  . CATARACT EXTRACTION  2004,2005  . HERNIA REPAIR  2006  . HIATAL HERNIA REPAIR  1966  . NISSEN FUNDOPLICATION    . TOTAL ABDOMINAL HYSTERECTOMY W/ BILATERAL SALPINGOOPHORECTOMY  1974   hx of cancer    Family History  Problem Relation Age of Onset  . Breast cancer Mother   . Diabetes Mother    Social History  Substance Use Topics  . Smoking status: Former Smoker    Quit date: 08/23/1989  . Smokeless tobacco: Never Used  . Alcohol use 0.0 oz/week     Comment: Occasional   OB History    No data available     Review of Systems  Constitutional: Negative for chills and  fever.  Musculoskeletal: Positive for arthralgias, gait problem (uses motorized scooter to get around most of the time normally), joint swelling and myalgias.       Right lower leg  Skin: Positive for color change. Negative for rash and wound.    Allergies  Iodine and Ivp dye [iodinated diagnostic agents]  Home Medications   Prior to Admission medications   Medication Sig Start Date End Date Taking? Authorizing Provider  albuterol (PROVENTIL HFA;VENTOLIN HFA) 108 (90 Base) MCG/ACT inhaler Inhale two puffs every 4-6 hours only as needed for shortness of breath or wheezing. 06/21/15 06/20/16 Yes Sean Hommel, DO  AMBULATORY NON FORMULARY MEDICATION Accu-Chek SmartView glucose testing strips. Use to check blood sugar twice a day.  Type 2 diabetes. 12/15/14  Yes Sean Hommel, DO  AMBULATORY NON FORMULARY MEDICATION Accu-Chek FastClix Lancets Drum. Use to check blood sugar twice a day. Type 2 diabetes. 06/14/15  Yes Sean Hommel, DO  aspirin EC 81 MG tablet Take 1 tablet (81 mg total) by mouth daily. 12/15/14  Yes Sean Hommel, DO  budesonide-formoterol (SYMBICORT) 80-4.5 MCG/ACT inhaler Inhale 2 puffs into the lungs 2 (two) times daily. 09/20/15  Yes Sean Hommel, DO  diltiazem (CARDIZEM CD) 180 MG 24 hr capsule TAKE 1 CAPSULE (180 MG TOTAL) BY MOUTH DAILY. 09/19/15  Yes Sean Hommel, DO  furosemide (LASIX)  20 MG tablet Take 1 tablet (20 mg total) by mouth 2 (two) times daily. 08/19/15 08/18/16 Yes Sean Hommel, DO  Lutein ([facility] LUTEIN) 40 MG CAPS Take 1 capsule by mouth daily.   Yes Historical Provider, MD  metFORMIN (GLUCOPHAGE) 500 MG tablet Take 1 tablet (500 mg total) by mouth 2 (two) times daily with a meal. 06/21/15  Yes Sean Hommel, DO  metoprolol succinate (TOPROL-XL) 25 MG 24 hr tablet Take 1 tablet (25 mg total) by mouth daily. 11/08/14  Yes Lelon Perla, MD  mirabegron ER (MYRBETRIQ) 50 MG TB24 tablet Take 1 tablet (50 mg total) by mouth daily. 11/14/15  Yes Jade L Breeback, PA-C  Multiple  Vitamins-Minerals (CENTRUM SILVER ULTRA WOMENS PO) Take by mouth.   Yes Historical Provider, MD  Multiple Vitamins-Minerals (PRESERVISION AREDS PO) Take 2 capsules by mouth 2 (two) times daily.   Yes Historical Provider, MD  Omega-3 Fatty Acids (FISH OIL) 1000 MG CAPS Take 1,000 mg by mouth daily.   Yes Historical Provider, MD  omeprazole (PRILOSEC) 20 MG capsule TAKE 1 CAPSULE (20 MG TOTAL) BY MOUTH 2 (TWO) TIMES DAILY BEFORE A MEAL. 10/17/15  Yes Jade L Breeback, PA-C  OXYGEN Inhale 2 L into the lungs.   Yes Historical Provider, MD  Polyethyl Glycol-Propyl Glycol (SYSTANE ULTRA OP) Apply to eye.   Yes Historical Provider, MD  Travoprost, BAK Free, (TRAVATAN Z) 0.004 % SOLN ophthalmic solution 1 drop at bedtime.   Yes Historical Provider, MD  triamcinolone cream (KENALOG) 0.1 % Apply to affected areas twice a day for up to two weeks, avoid face. 09/20/15 09/19/16 Yes Sean Hommel, DO  warfarin (COUMADIN) 4 MG tablet Take 1 tablet (4 mg total) by mouth daily. On Tuesday and Thursday. Discontinue 68m formulation. 11/14/15  Yes JDonella Stade PA-C  warfarin (COUMADIN) 6 MG tablet Take 651mon Sunday, Monday, Wednesday and Friday and Saturday. 11/14/15  Yes Jade L Breeback, PA-C  HYDROcodone-acetaminophen (NORCO/VICODIN) 5-325 MG tablet Take 1 tablet by mouth every 6 (six) hours as needed for moderate pain or severe pain. 11/27/15   ErNoland FordycePA-C  ondansetron (ZOFRAN) 4 MG tablet Take 1 tablet (4 mg total) by mouth every 6 (six) hours. 11/27/15   ErNoland FordycePA-C   Meds Ordered and Administered this Visit   Medications  ondansetron (ZOFRAN-ODT) disintegrating tablet 4 mg (4 mg Oral Given 11/27/15 1606)    BP 139/68 (BP Location: Left Arm)   Pulse 74   Temp 97.3 F (36.3 C) (Oral)   Ht 5' 1.5" (1.562 m)   Wt 200 lb (90.7 kg)   SpO2 96%   BMI 37.18 kg/m  No data found.   Physical Exam  Constitutional: She is oriented to person, place, and time. She appears well-developed and  well-nourished. No distress.  HENT:  Head: Normocephalic and atraumatic.  Eyes: EOM are normal.  Neck: Normal range of motion. Neck supple.  Cardiovascular: Normal rate and regular rhythm.   Pulmonary/Chest: Effort normal and breath sounds normal. No respiratory distress. She has no wheezes. She has no rales.  Musculoskeletal: Normal range of motion. She exhibits edema and tenderness.  Right lower leg: 3-4+ edema, tenderness to mid tibia over anterior and lateral aspect. Full ROM knee, ankle, and toes. No tenderness to ankle, foot or toes.   Neurological: She is alert and oriented to person, place, and time.  Skin: Skin is warm and dry. Capillary refill takes less than 2 seconds. She is not diaphoretic.  Right lower  leg: skin in tact. Moderate ecchymosis to anterior and medial aspect of lower leg. Significant ecchymosis of foot. No induration or fluctuance.  Psychiatric: She has a normal mood and affect. Her behavior is normal.  Nursing note and vitals reviewed.   Urgent Care Course   Clinical Course     Procedures (including critical care time)  Labs Review Labs Reviewed - No data to display  Imaging Review Dg Tibia/fibula Right  Result Date: 11/27/2015 CLINICAL DATA:  Fall 1 week ago. Right leg bruising and pain. Initial encounter. EXAM: RIGHT TIBIA AND FIBULA - 2 VIEW COMPARISON:  None. FINDINGS: There is no evidence of fracture or other focal bone lesions. Diffuse soft tissue swelling is seen, without evidence of soft tissue gas. Mild degenerative spurring is seen involving the medial compartment and tibial spines of the knee joint. IMPRESSION: Diffuse right leg soft tissue swelling.  No evidence of fracture. Mild right knee osteoarthritis. Electronically Signed   By: Earle Gell M.D.   On: 11/27/2015 16:04    MDM   1. Leg edema, right   2. Right leg injury, initial encounter   3. Traumatic ecchymosis of right lower leg, initial encounter    Pt c/o severe Right lower leg  pain, swelling and bruising. Tenderness, bruising and swelling noted on exam. Pt able to bear weight on Right leg. Pt is currently on Coumadin and gets her INR checked regularly. She is to f/u tomorrow for INR test.  Plain films: Negative for fracture. Encouraged to keep leg elevated to help with swelling. Rx: Norco and zofran Encouraged to call PCP to schedule possible U/S of lower leg if INR is off tomorrow. However, if INR is at therapeutic level, doubt DVT. Encouraged f/u with PCP next week for recheck of symptoms.     Noland Fordyce, PA-C 11/27/15 [phone]

## 2015-11-28 ENCOUNTER — Ambulatory Visit: Payer: Commercial Managed Care - HMO | Admitting: Family Medicine

## 2015-11-29 ENCOUNTER — Ambulatory Visit (INDEPENDENT_AMBULATORY_CARE_PROVIDER_SITE_OTHER): Payer: Commercial Managed Care - HMO | Admitting: Physician Assistant

## 2015-11-29 ENCOUNTER — Ambulatory Visit (INDEPENDENT_AMBULATORY_CARE_PROVIDER_SITE_OTHER): Payer: Commercial Managed Care - HMO | Admitting: Sports Medicine

## 2015-11-29 ENCOUNTER — Other Ambulatory Visit: Payer: Self-pay

## 2015-11-29 DIAGNOSIS — I480 Paroxysmal atrial fibrillation: Secondary | ICD-10-CM | POA: Diagnosis not present

## 2015-11-29 DIAGNOSIS — S8011XA Contusion of right lower leg, initial encounter: Secondary | ICD-10-CM | POA: Diagnosis not present

## 2015-11-29 DIAGNOSIS — H43813 Vitreous degeneration, bilateral: Secondary | ICD-10-CM

## 2015-11-29 DIAGNOSIS — H47011 Ischemic optic neuropathy, right eye: Secondary | ICD-10-CM | POA: Insufficient documentation

## 2015-11-29 LAB — POCT INR: INR: 4

## 2015-11-29 MED ORDER — WARFARIN SODIUM 4 MG PO TABS
4.0000 mg | ORAL_TABLET | Freq: Every day | ORAL | 1 refills | Status: DC
Start: 2015-11-29 — End: 2015-12-26

## 2015-11-29 MED ORDER — TRAMADOL HCL 50 MG PO TABS: ORAL_TABLET | ORAL | 0 refills | Status: DC

## 2015-11-29 MED ORDER — WARFARIN SODIUM 6 MG PO TABS: ORAL_TABLET | ORAL | 1 refills | Status: DC

## 2015-11-29 NOTE — Assessment & Plan Note (Signed)
Attended aspiration, injection. The entire extremity from ankle to knee was strapped with compressive dressing. Adding tramadol for pain.

## 2015-11-29 NOTE — Progress Notes (Signed)
   Subjective:    I'm seeing this patient as a consultation for:  Noland Fordyce PA-C, Iran Planas, PA-C  CC: Right leg injury  HPI: Week ago this pleasant 82 year old female tripped over a suitcase, impacted her right leg, she had immediate pain, swelling, bruising. She was seen in urgent care or x-rays of the knee and leg were negative for evidence of fracture. She has severe pain over the anterior knee, with bruising, pain, swelling down the entire leg.  Past medical history:  Negative.  See flowsheet/record as well for more information.  Surgical history: Negative.  See flowsheet/record as well for more information.  Family history: Negative.  See flowsheet/record as well for more information.  Social history: Negative.  See flowsheet/record as well for more information.  Allergies, and medications have been entered into the medical record, reviewed, and no changes needed.   Review of Systems: No headache, visual changes, nausea, vomiting, diarrhea, constipation, dizziness, abdominal pain, skin rash, fevers, chills, night sweats, weight loss, swollen lymph nodes, body aches, joint swelling, muscle aches, chest pain, shortness of breath, mood changes, visual or auditory hallucinations.   Objective:   General: Well Developed, well nourished, and in no acute distress.  Neuro/Psych: Alert and oriented x3, extra-ocular muscles intact, able to move all 4 extremities, sensation grossly intact. Skin: Warm and dry, no rashes noted.  Respiratory: Not using accessory muscles, speaking in full sentences, trachea midline.  Cardiovascular: Pulses palpable, no extremity edema. Abdomen: Does not appear distended. Right leg: Swollen, bruised, fluctuance and tenderness at the prepatellar bursa. Only minimal tenderness at the joint line, swelling is present all the way down the leg with pitting edema, and swelling and bruising over the foot itself.   Procedure: Real-time Ultrasound Guided  aspiration/injection of right anterior leg hematoma Device: GE Logiq E  Verbal informed consent obtained.  Time-out conducted.  Noted no overlying erythema, induration, or other signs of local infection.  Skin prepped in a sterile fashion.  Local anesthesia: Topical Ethyl chloride.  With sterile technique and under real time ultrasound guidance: Unable to aspirate any hematoma with an 18-gauge needle, syringe switched and 1 mL kenalog 40, 1 mL lidocaine injected easily.  Completed without difficulty  Pain immediately resolved suggesting accurate placement of the medication.  Advised to call if fevers/chills, erythema, induration, drainage, or persistent bleeding.  Images permanently stored and available for review in the ultrasound unit.  Impression: Technically successful ultrasound guided injection.  The ankle up to the knee were strapped with compressive dressing.  Impression and Recommendations:   This case required medical decision making of moderate complexity.  Hematoma of leg, right, initial encounter Attended aspiration, injection. The entire extremity from ankle to knee was strapped with compressive dressing. Adding tramadol for pain.

## 2015-12-04 ENCOUNTER — Other Ambulatory Visit: Payer: Self-pay | Admitting: Cardiology

## 2015-12-05 DIAGNOSIS — J449 Chronic obstructive pulmonary disease, unspecified: Secondary | ICD-10-CM | POA: Diagnosis not present

## 2015-12-12 ENCOUNTER — Ambulatory Visit (INDEPENDENT_AMBULATORY_CARE_PROVIDER_SITE_OTHER): Payer: Commercial Managed Care - HMO | Admitting: Physician Assistant

## 2015-12-12 DIAGNOSIS — I480 Paroxysmal atrial fibrillation: Secondary | ICD-10-CM

## 2015-12-12 LAB — POCT INR: INR: 2.2

## 2015-12-12 NOTE — Progress Notes (Signed)
Pt notified of recommendations and verbalized understanding

## 2015-12-12 NOTE — Progress Notes (Signed)
Pt is here for a INR check. Pt confirmed taking 6 mg on Monday, Wednesday Friday, and 4 mg all other days. Denied skipped doses, medication and diet changes. Pt reports that for the past couple weeks after eating breakfast she have a feeling of being lightheaded, dizzy, and nausea. When this happens she do check her sugar and BP both seem to be ok. She wants to know do you possibly have an idea of what's going on. Will route to PCP for further review.  BP a little low today. Keep a log of BP in the mornings. Feel free to make appt so that we can discuss this together. Iran Planas PA-C

## 2015-12-12 NOTE — Patient Instructions (Signed)
Stay on same dose. Recheck in 2 weeks.

## 2015-12-26 ENCOUNTER — Ambulatory Visit (INDEPENDENT_AMBULATORY_CARE_PROVIDER_SITE_OTHER): Payer: Commercial Managed Care - HMO | Admitting: Sports Medicine

## 2015-12-26 ENCOUNTER — Ambulatory Visit (HOSPITAL_BASED_OUTPATIENT_CLINIC_OR_DEPARTMENT_OTHER)
Admission: RE | Admit: 2015-12-26 | Discharge: 2015-12-26 | Disposition: A | Discharge status: Home / Self Care | Payer: Commercial Managed Care - HMO | Source: Ambulatory Visit | Attending: Sports Medicine | Admitting: Sports Medicine

## 2015-12-26 ENCOUNTER — Ambulatory Visit (INDEPENDENT_AMBULATORY_CARE_PROVIDER_SITE_OTHER): Payer: Commercial Managed Care - HMO | Admitting: Physician Assistant

## 2015-12-26 ENCOUNTER — Encounter: Payer: Self-pay | Admitting: Physician Assistant

## 2015-12-26 VITALS — BP 143/44 | HR 61

## 2015-12-26 DIAGNOSIS — X58XXXA Exposure to other specified factors, initial encounter: Secondary | ICD-10-CM | POA: Diagnosis not present

## 2015-12-26 DIAGNOSIS — E119 Type 2 diabetes mellitus without complications: Secondary | ICD-10-CM | POA: Diagnosis not present

## 2015-12-26 DIAGNOSIS — S8011XA Contusion of right lower leg, initial encounter: Secondary | ICD-10-CM | POA: Insufficient documentation

## 2015-12-26 DIAGNOSIS — R609 Edema, unspecified: Secondary | ICD-10-CM | POA: Diagnosis not present

## 2015-12-26 DIAGNOSIS — I480 Paroxysmal atrial fibrillation: Secondary | ICD-10-CM | POA: Diagnosis not present

## 2015-12-26 DIAGNOSIS — S8991XA Unspecified injury of right lower leg, initial encounter: Secondary | ICD-10-CM | POA: Diagnosis not present

## 2015-12-26 LAB — BASIC METABOLIC PANEL WITH GFR
BUN: 23 mg/dL (ref 7–25)
CALCIUM: 9 mg/dL (ref 8.6–10.4)
CO2: 31 mmol/L (ref 20–31)
Chloride: 105 mmol/L (ref 98–110)
Creat: 0.63 mg/dL (ref 0.60–0.88)
GFR, EST NON AFRICAN AMERICAN: 85 mL/min (ref >=60)
GLUCOSE: 103 mg/dL — AB (ref 65–99)
POTASSIUM: 4.2 mmol/L (ref 3.5–5.3)
SODIUM: 143 mmol/L (ref 135–146)

## 2015-12-26 LAB — POCT GLYCOSYLATED HEMOGLOBIN (HGB A1C): Hemoglobin A1C: 5.3

## 2015-12-26 LAB — POCT INR: INR: 2

## 2015-12-26 MED ORDER — RIVAROXABAN 20 MG PO TABS: 20.0000 mg | ORAL_TABLET | Freq: Every day | ORAL | 2 refills | Status: DC

## 2015-12-26 MED ORDER — GABAPENTIN 100 MG PO CAPS: 100.0000 mg | ORAL_CAPSULE | Freq: Two times a day (BID) | ORAL | 11 refills | Status: DC

## 2015-12-26 MED ORDER — WARFARIN SODIUM 6 MG PO TABS: ORAL_TABLET | ORAL | 0 refills | Status: DC

## 2015-12-26 MED ORDER — WARFARIN SODIUM 4 MG PO TABS
4.0000 mg | ORAL_TABLET | Freq: Every day | ORAL | 0 refills | Status: DC
Start: 2015-12-26 — End: 2016-01-09

## 2015-12-26 NOTE — Progress Notes (Signed)
   Subjective:    Patient ID: Becky Newton, female    DOB: [birth year], 82 y.o.   MRN: [id number]  HPI  Pt is a 82 yo female who presents to the clinic with her husband for 3 month follow up on DM.   DM- pt is checking her sugars and ranging 85-100 most days. She does feel like she is weak at times. She does feel better when eating. She is only on metformin 58m bid. She did start taking b12 and seems to have more energy. She denies any open sores or wounds.   Pt has a.fib and on coumadin. She is interested in new generation anti-coagulants. She has not been seen by Dr. CStanford Breedin a while. She has been checking BP and at times her BP drops in the 90's over 70's.  She denies any palpitations or CP.    Review of Systems  All other systems reviewed and are negative.      Objective:   Physical Exam  Constitutional: She is oriented to person, place, and time. She appears well-developed and well-nourished.  Morbidly obese.   HENT:  Head: Normocephalic and atraumatic.  Pulmonary/Chest: Effort normal and breath sounds normal.  Neurological: She is alert and oriented to person, place, and time.  Psychiatric: She has a normal mood and affect. Her behavior is normal.          Assessment & Plan:  .Marland KitchenMarland KitchenTaneeshawas seen today for diabetes and atrial fibrillation.  Diagnoses and all orders for this visit:  Type 2 diabetes mellitus without complication, without long-term current use of insulin (HCC) -     POCT HgB A1C -     BASIC METABOLIC PANEL WITH GFR  Paroxysmal atrial fibrillation ([facility]CC) -     POCT INR -     warfarin (COUMADIN) 4 MG tablet; Take 1 tablet (4 mg total) by mouth daily. On Tuesday, Thursday,  Sunday. -     warfarin (COUMADIN) 6 MG tablet; Take 647mon Monday, Wednesday, Friday and saturday. -     BASIC METABOLIC PANEL WITH GFR  Other orders -     Discontinue: rivaroxaban (XARELTO) 20 MG TABS tablet; Take 1 tablet (20 mg total) by mouth daily with supper.   A1C today  was 5.3.  Stopped metformin today.  Continue to monitor sugars. If trending above 130-150 add back one metformin daily.  Recheck in 3 months.   INR2.0 today. Increased by 110m40m week.  New dose is Increase dose to  4mg6m Sunday, Tuesday, Thursday and 6mg 69mday, Wednesday, Friday and saturday. Recheck in 2 weeks.    Pt was interested in new generation anti-coagulant. Sent xaralto.  Pt called back and said xaralto was too expensive. It was taken off list.   BP log scanned. Discussed some low readings due to rate control drugs. Discussed proper hydration and not limiting salt. Continue to monitor. If having more and more low days will consider medication management.

## 2015-12-26 NOTE — Patient Instructions (Addendum)
Increase dose to  29m on Sunday, Tuesday, Thursday and 674mMonday, Wednesday, Friday and saturday. Recheck in 2 weeks.   Stop metformin. If findings sugars are running in 130's-150's can go back on one tablet daily.

## 2015-12-26 NOTE — Progress Notes (Signed)
  Subjective:    CC: Follow-up  HPI: We are now one month post leg trauma and the right side, we attended aspiration and then injected a hematoma, she has improved but still has significant hyperalgesia on the right leg. Pain is moderate, persistent, on further questioning she also has similar symptoms on the other leg.  Past medical history:  Negative.  See flowsheet/record as well for more information.  Surgical history: Negative.  See flowsheet/record as well for more information.  Family history: Negative.  See flowsheet/record as well for more information.  Social history: Negative.  See flowsheet/record as well for more information.  Allergies, and medications have been entered into the medical record, reviewed, and no changes needed.   Review of Systems: No fevers, chills, night sweats, weight loss, chest pain, or shortness of breath.   Objective:    General: Well Developed, well nourished, and in no acute distress.  Neuro: Alert and oriented x3, extra-ocular muscles intact, sensation grossly intact.  HEENT: Normocephalic, atraumatic, pupils equal round reactive to light, neck supple, no masses, no lymphadenopathy, thyroid nonpalpable.  Skin: Warm and dry, no rashes. Cardiac: Regular rate and rhythm, no murmurs rubs or gallops, no lower extremity edema.  Respiratory: Clear to auscultation bilaterally. Not using accessory muscles, speaking in full sentences. Right leg: Minimally swollen, hyperesthetic and hyperalgesic. Positive Homans sign. Similar hyperalgesia on the left leg good pulses, no skin breakdown.  Impression and Recommendations:    Hematoma of leg, right, initial encounter Improved significantly after injection of the hematoma at the last visit. Still with some hypersensitivity over the legs, not surprisingly she has hypersensitivity to light palpation over the contralateral leg as well. This implies some degree of myofascial pain syndrome/fibromyalgia. Adding  low-dose gabapentin to start. She will also get a lower extremity compression stocking. To complete the workup I am going to add lower extremity Dopplers.  I spent 25 minutes with this patient, greater than 50% was face-to-face time counseling regarding the above diagnoses

## 2015-12-26 NOTE — Assessment & Plan Note (Signed)
Improved significantly after injection of the hematoma at the last visit. Still with some hypersensitivity over the legs, not surprisingly she has hypersensitivity to light palpation over the contralateral leg as well. This implies some degree of myofascial pain syndrome/fibromyalgia. Adding low-dose gabapentin to start. She will also get a lower extremity compression stocking. To complete the workup I am going to add lower extremity Dopplers.

## 2015-12-26 NOTE — Patient Instructions (Signed)
[facility] Number: [phone] Toll Free: [phone]) Fax: [phone] or [phone] [address] [place], [place] [address] [place]

## 2015-12-27 NOTE — Progress Notes (Signed)
Call pt: kidney function looks great.

## 2015-12-28 ENCOUNTER — Telehealth: Payer: Self-pay

## 2015-12-28 NOTE — Telephone Encounter (Signed)
Pt is concerned about her bp.  She noticed last night that she was dizzy and checked her bp. It was 96/43.  Since last night she checked it throughout the day.  She is worried that her BP is too low. Please advise.  Today's Readings:  8:00   113/53 9:15   100/51 10:00 133/55 10:30 121/54 11:00 115/57 1:00   100/52

## 2015-12-28 NOTE — Telephone Encounter (Signed)
It is on the lower side of normal. She could cut metoprolol in half to be 12.5 mg and increase salt in diet with water. Keep monitoring BP's.

## 2015-12-28 NOTE — Telephone Encounter (Signed)
Pt notified

## 2015-12-30 ENCOUNTER — Telehealth: Payer: Self-pay

## 2015-12-30 NOTE — Telephone Encounter (Signed)
They look like they are  coming up a bit. That is good. How is dizziness? This in on 1/2 metoprolol right?

## 2015-12-30 NOTE — Telephone Encounter (Signed)
Pt confirmed that she is taking 1/2 of metoprolol. She also stated that she still have dizzy spells but not as often.

## 2015-12-30 NOTE — Telephone Encounter (Signed)
Pt is reporting her BP readings.   12/29/15  8 am    124/60 10 am  106/46 8 pm    122/51  12/30/15  2 am 144/56

## 2015-12-30 NOTE — Telephone Encounter (Signed)
Ok awesome. Keep up hydration and continue to add a little bit of salt to diet regularly.

## 2016-01-02 NOTE — Telephone Encounter (Signed)
Pt notified

## 2016-01-04 DIAGNOSIS — J449 Chronic obstructive pulmonary disease, unspecified: Secondary | ICD-10-CM | POA: Diagnosis not present

## 2016-01-09 ENCOUNTER — Ambulatory Visit (INDEPENDENT_AMBULATORY_CARE_PROVIDER_SITE_OTHER): Payer: Commercial Managed Care - HMO | Admitting: Physician Assistant

## 2016-01-09 ENCOUNTER — Other Ambulatory Visit: Payer: Self-pay

## 2016-01-09 ENCOUNTER — Ambulatory Visit: Payer: Commercial Managed Care - HMO | Admitting: Sports Medicine

## 2016-01-09 DIAGNOSIS — I480 Paroxysmal atrial fibrillation: Secondary | ICD-10-CM

## 2016-01-09 LAB — POCT INR: INR: 2.8

## 2016-01-09 MED ORDER — WARFARIN SODIUM 4 MG PO TABS: 4.0000 mg | ORAL_TABLET | Freq: Every day | ORAL | 2 refills | Status: DC

## 2016-01-09 MED ORDER — WARFARIN SODIUM 6 MG PO TABS: ORAL_TABLET | ORAL | 2 refills | Status: DC

## 2016-01-09 NOTE — Progress Notes (Signed)
Patient advised of recommendations.

## 2016-01-09 NOTE — Progress Notes (Signed)
Patient ID: Becky Newton, female   DOB: 1933/06/19, 82 y.o.   MRN: [id number] Stay on same dose.  64m on Sunday, Tuesday, Thursday and 689mMonday, Wednesday, Friday and saturday. Recheck in 2 weeks.

## 2016-01-09 NOTE — Patient Instructions (Signed)
Stay on same dose.  71m on Sunday, Tuesday, Thursday and 649mMonday, Wednesday, Friday and saturday. Recheck in 2 weeks.

## 2016-01-25 ENCOUNTER — Ambulatory Visit: Payer: Commercial Managed Care - HMO

## 2016-01-26 ENCOUNTER — Ambulatory Visit (INDEPENDENT_AMBULATORY_CARE_PROVIDER_SITE_OTHER): Payer: Commercial Managed Care - HMO | Admitting: Family Medicine

## 2016-01-26 DIAGNOSIS — I480 Paroxysmal atrial fibrillation: Secondary | ICD-10-CM | POA: Diagnosis not present

## 2016-01-26 LAB — POCT INR: INR: 3.4

## 2016-01-26 NOTE — Progress Notes (Signed)
Attempted to contact Pt, she was busy. Will return clinic call.

## 2016-01-26 NOTE — Progress Notes (Signed)
Pt advised of Rx change. Verbalized understanding. Will call for follow up appt.

## 2016-01-27 ENCOUNTER — Ambulatory Visit: Payer: Commercial Managed Care - HMO

## 2016-02-04 DIAGNOSIS — J449 Chronic obstructive pulmonary disease, unspecified: Secondary | ICD-10-CM | POA: Diagnosis not present

## 2016-02-13 ENCOUNTER — Ambulatory Visit (INDEPENDENT_AMBULATORY_CARE_PROVIDER_SITE_OTHER): Payer: Commercial Managed Care - HMO | Admitting: Physician Assistant

## 2016-02-13 DIAGNOSIS — I480 Paroxysmal atrial fibrillation: Secondary | ICD-10-CM

## 2016-02-13 LAB — POCT INR: INR: 3.2

## 2016-02-13 NOTE — Progress Notes (Signed)
Decrease dose.  42m on Sunday, Tuesday, Wednesday, Thursday and Friday. 692mMonday and saturday. Recheck in 3 weeks.  Discussed with patient in office.

## 2016-02-13 NOTE — Patient Instructions (Signed)
Decrease dose.  94m on Sunday, Tuesday, Wednesday, Thursday and Friday. 626mMonday and saturday. Recheck in 3 weeks.

## 2016-02-15 DIAGNOSIS — H43813 Vitreous degeneration, bilateral: Secondary | ICD-10-CM | POA: Diagnosis not present

## 2016-02-15 DIAGNOSIS — H401133 Primary open-angle glaucoma, bilateral, severe stage: Secondary | ICD-10-CM | POA: Diagnosis not present

## 2016-02-15 DIAGNOSIS — Z961 Presence of intraocular lens: Secondary | ICD-10-CM | POA: Diagnosis not present

## 2016-02-15 DIAGNOSIS — H47011 Ischemic optic neuropathy, right eye: Secondary | ICD-10-CM | POA: Diagnosis not present

## 2016-02-29 ENCOUNTER — Telehealth: Payer: Self-pay | Admitting: Physician Assistant

## 2016-02-29 ENCOUNTER — Ambulatory Visit: Payer: Commercial Managed Care - HMO

## 2016-02-29 NOTE — Telephone Encounter (Signed)
Left recommendation on Pt's VM. Callback provided for any questions.

## 2016-02-29 NOTE — Telephone Encounter (Signed)
As long as not bleeding for more than 10 minutes or bleeding doesn't stop. I do think should get INR checked to make sure in normal range. Most nosebleeds come from from dry nose. Consider OTC nasal gel ayr to keep moisturized.

## 2016-02-29 NOTE — Telephone Encounter (Signed)
I noticed Pt was on the nurse schedule for an early INR check due to nosebleeds.  I called to check on Pt, she states she had a nosebleed on Sunday, Monday, and Tuesday. No report of nosebleed today. Pt reports the nosebleeds "aren't bad" and she can "get them stopped quickly by holding ice" on her nose. Her husband was called into work so she did not have transportation for visit. Will route to PCP to see if there are any recommendation to be made prior to Pt coming in, appt has been rescheduled.

## 2016-03-05 ENCOUNTER — Encounter: Payer: Self-pay | Admitting: Physician Assistant

## 2016-03-05 ENCOUNTER — Encounter: Payer: Self-pay | Admitting: *Deleted

## 2016-03-05 ENCOUNTER — Ambulatory Visit (INDEPENDENT_AMBULATORY_CARE_PROVIDER_SITE_OTHER): Payer: Commercial Managed Care - HMO | Admitting: Physician Assistant

## 2016-03-05 ENCOUNTER — Ambulatory Visit (INDEPENDENT_AMBULATORY_CARE_PROVIDER_SITE_OTHER): Payer: Medicare HMO | Admitting: *Deleted

## 2016-03-05 VITALS — BP 139/59 | HR 78

## 2016-03-05 VITALS — BP 139/59 | HR 78 | Temp 97.5°F | Ht 62.0 in | Wt 210.0 lb

## 2016-03-05 DIAGNOSIS — Z0000 Encounter for general adult medical examination without abnormal findings: Secondary | ICD-10-CM | POA: Diagnosis not present

## 2016-03-05 DIAGNOSIS — I480 Paroxysmal atrial fibrillation: Secondary | ICD-10-CM

## 2016-03-05 DIAGNOSIS — R040 Epistaxis: Secondary | ICD-10-CM | POA: Insufficient documentation

## 2016-03-05 LAB — POCT INR: INR: 1.9

## 2016-03-05 NOTE — Patient Instructions (Addendum)
Health maintenance:Fall Prevention, Microalbumin Urican ne, Bone Density Scan   Abnormal screenings: None known.   Patient concerns: Would like to inquire about a home health nurse coming to her home to do her coumadin/inr CHECKS.   Nurse concerns:Declined DEXA.   Next PCP appt: To be determined by PCP, Iran Planas, PA-C today after her visit.  You will follow up in one year for your next AWV .

## 2016-03-05 NOTE — Progress Notes (Signed)
Subjective:   Becky Newton is a 83 y.o. female who presents for an Initial Medicare Annual Wellness Visit.  Review of Systems    Cardiac Risk Factors include: advanced age (>33mn, >>68women);diabetes mellitus;dyslipidemia;hypertension;microalbuminuria;obesity (BMI >30kg/m2);sedentary lifestyle     Objective:    Today's Vitals   03/05/16 1024  BP: (!) 139/59  Pulse: 78  Temp: 97.5 F (36.4 C)  TempSrc: Oral  SpO2: 95%  Weight: 210 lb (95.3 kg)  Height: _0  (1.575 m)   Body mass index is 38.41 kg/m.   Current Medications (verified) Outpatient Encounter Prescriptions as of 03/05/2016  Medication Sig  . albuterol (PROVENTIL HFA;VENTOLIN HFA) 108 (90 Base) MCG/ACT inhaler Inhale two puffs every 4-6 hours only as needed for shortness of breath or wheezing.  . AMBULATORY NON FORMULARY MEDICATION Accu-Chek SmartView glucose testing strips. Use to check blood sugar twice a day.  Type 2 diabetes.  . AMBULATORY NON FORMULARY MEDICATION Accu-Chek FastClix Lancets Drum. Use to check blood sugar twice a day. Type 2 diabetes.  .Marland Kitchenaspirin EC 81 MG tablet Take 1 tablet (81 mg total) by mouth daily.  .Marland Kitchendiltiazem (CARDIZEM CD) 180 MG 24 hr capsule TAKE 1 CAPSULE (180 MG TOTAL) BY MOUTH DAILY.  .Marland KitchenHYDROcodone-acetaminophen (NORCO/VICODIN) 5-325 MG tablet Take 1 tablet by mouth every 6 (six) hours as needed for moderate pain or severe pain.  . Lutein ([employer] LUTEIN) 40 MG CAPS Take 1 capsule by mouth daily.  . metoprolol succinate (TOPROL-XL) 25 MG 24 hr tablet TAKE 1 TABLET (25 MG TOTAL) BY MOUTH DAILY. (Patient taking differently: Take 12.5 mg by mouth daily. )  . Multiple Vitamins-Minerals (CENTRUM SILVER ULTRA WOMENS PO) Take by mouth.  . Multiple Vitamins-Minerals (PRESERVISION AREDS PO) Take 2 capsules by mouth 2 (two) times daily.  . Omega-3 Fatty Acids (FISH OIL) 1000 MG CAPS Take 1,000 mg by mouth daily.  .Marland Kitchenomeprazole (PRILOSEC) 20 MG capsule TAKE 1 CAPSULE (20 MG TOTAL) BY MOUTH 2  (TWO) TIMES DAILY BEFORE A MEAL.  .Marland KitchenOXYGEN Inhale 2 L into the lungs.  .Vladimir FasterGlycol-Propyl Glycol (SYSTANE ULTRA OP) Apply to eye.  . tolterodine (DETROL LA) 4 MG 24 hr capsule Take 4 mg by mouth daily.  . Travoprost, BAK Free, (TRAVATAN Z) 0.004 % SOLN ophthalmic solution 1 drop at bedtime.  . ondansetron (ZOFRAN) 4 MG tablet Take 1 tablet (4 mg total) by mouth every 6 (six) hours. (Patient not taking: Reported on 03/05/2016)  . warfarin (COUMADIN) 4 MG tablet Take 1 tablet (4 mg total) by mouth daily. On Tuesday, Thursday,  Sunday.  . warfarin (COUMADIN) 6 MG tablet Take 611mon Monday, Wednesday, Friday and saturday.  . [DISCONTINUED] budesonide-formoterol (SYMBICORT) 80-4.5 MCG/ACT inhaler Inhale 2 puffs into the lungs 2 (two) times daily.  . [DISCONTINUED] furosemide (LASIX) 20 MG tablet Take 1 tablet (20 mg total) by mouth 2 (two) times daily. (Patient not taking: Reported on 02/13/2016)  . [DISCONTINUED] gabapentin (NEURONTIN) 100 MG capsule Take 1 capsule (100 mg total) by mouth 2 (two) times daily.  . [DISCONTINUED] traMADol (ULTRAM) 50 MG tablet 1-2 tabs by mouth Q8 hours, maximum 6 tabs per day.  . [DISCONTINUED] triamcinolone cream (KENALOG) 0.1 % Apply to affected areas twice a day for up to two weeks, avoid face.   No facility-administered encounter medications on file as of 03/05/2016.     Allergies (verified) Iodine and Ivp dye [iodinated diagnostic agents]   History: Past Medical History:  Diagnosis Date  .  Atrial fibrillation ([place])   . COPD (chronic obstructive pulmonary disease) ([place])   . Diabetes ([place])   . Fibrocystic breast determined by biopsy 1977  . H/O hernia repair 2006  . H/O left breast biopsy 1982  . Incisional hernia   . Lung disease    Paralyzed left hemidiaphragm  . S/P scar revision    Past Surgical History:  Procedure Laterality Date  . APPENDECTOMY  1966  . CATARACT EXTRACTION  2004,2005  . HERNIA REPAIR  2006  . HIATAL HERNIA REPAIR  1966   . NISSEN FUNDOPLICATION    . TOTAL ABDOMINAL HYSTERECTOMY W/ BILATERAL SALPINGOOPHORECTOMY  1974   hx of cancer    Family History  Problem Relation Age of Onset  . Breast cancer Mother   . Diabetes Mother    Social History   Occupational History  . Not on file.   Social History Main Topics  . Smoking status: Former Smoker    Packs/day: 0.50    Years: 20.00    Types: Cigarettes    Quit date: 08/23/1989  . Smokeless tobacco: Never Used  . Alcohol use No  . Drug use: No  . Sexual activity: No    Tobacco Counseling Counseling given: No   Activities of Daily Living In your present state of health, do you have any difficulty performing the following activities: 03/05/2016  Hearing? N  Vision? N  Difficulty concentrating or making decisions? N  Walking or climbing stairs? Y  Dressing or bathing? Y  Doing errands, shopping? Y  Preparing Food and eating ? Y  Using the Toilet? N  In the past six months, have you accidently leaked urine? N  Do you have problems with loss of bowel control? N  Managing your Medications? N  Managing your Finances? N  Housekeeping or managing your Housekeeping? Y  Some recent data might be hidden    Immunizations and Health Maintenance Immunization History  Administered Date(s) Administered  . Influenza, High Dose Seasonal PF 09/20/2015  . Influenza-Unspecified 10/03/2014   There are no preventive care reminders to display for this patient.  Patient Care Team: Donella Stade, PA-C as PCP - General (Family Medicine)  Indicate any recent Medical Services you may have received from other than [facility] providers in the past year (date may be approximate).     Assessment:   This is a routine wellness examination for Becky Newton.   Hearing/Vision screen No exam data present  Dietary issues and exercise activities discussed: Current Exercise Habits: Home exercise routine, Type of exercise: stretching, Time (Minutes): 10, Frequency (Times/Week):  5, Weekly Exercise (Minutes/Week): 50, Intensity: Mild, Exercise limited by: cardiac condition(s);respiratory conditions(s)  Goals      Patient Stated   . Prevent Falls (pt-stated)          She would like to continue not falling and preventive issues were discussed with pt. verbalizing understanding.      Depression Screen PHQ 2/9 Scores 03/05/2016  PHQ - 2 Score 0    Fall Risk Fall Risk  03/05/2016  Falls in the past year? No  Risk for fall due to : Impaired mobility    Cognitive Function:     6CIT Screen 03/05/2016  What Year? 0 points  What month? 0 points  What time? 0 points  Count back from 20 0 points  Months in reverse 0 points  Repeat phrase 0 points  Total Score 0    Screening Tests Health Maintenance  Topic Date Due  .  URINE MICROALBUMIN  03/19/2016 (Originally 11/27/1943)  . ZOSTAVAX  03/19/2016 (Originally 11/28/1995)  . PNA vac Low Risk Adult (1 of 2 - PCV13) 03/19/2016 (Originally 11/27/2000)  . FOOT EXAM  03/05/2017 (Originally 11/27/1943)  . DEXA SCAN  03/05/2017 (Originally 11/27/2000)  . TETANUS/TDAP  03/05/2017 (Originally 11/28/1952)  . HEMOGLOBIN A1C  06/25/2016  . OPHTHALMOLOGY EXAM  11/21/2016  . INFLUENZA VACCINE  Completed      Plan:  I have personally reviewed and addressed the Medicare Annual Wellness questionnaire and have noted the following in the patient's chart:  A. Medical and social history B. Use of alcohol, tobacco or illicit drugs-Quit smoking in 1991.  Formerly smoked about 1/2 ppd cigarettes x 20 years. C. Current medications and supplements D. Functional ability and status-Pt. Lives in a garage apartment that connects to her son's home.  She lives with her husband, who assists her with bathing and who also does the house chores and cooking.  Her son is close by in case additional assistance is needed and pt. States that he and his family are willing to help where needed.  E.  Nutritional status-Pt. States she will continue to  watch her diet and keep a log of her finger sticks every day and bring these to show Iran Planas, PA-C upon her next visit.  She states that for now, she has been taken off of her Metformin due to her excellent Hgb. A1-C results. F.  Physical activity-She in unable to do a lot of physical activity which is r/t her respiratory condition but she does state that when she is sitting and watching televison during the day, she will attempt to do about 10 minutes per day of simple leg lifts and and arm lifts. G. Advance directives-She believes that both she and her husband have these but is not sure where she put them.  I advised her to try and bring a copy of these with her when she returns to her next appt here and we could scan a copy in for her but if she could not locate them, we could always give her anothr copy, which she verbalized understanding of all. H. I.  Hospitalizations, surgeries, and ER visits in previous 12 months J.  Vitals-VS were WNL with the exception of her BMI being in the obese range at 38.41 today. K. Screenings to include  Vision- She wears Rx progressive glasses and states that she has no problems seeing with her glasses. Cognitive-Normal cognitive function noted by direct observation.   Depression-She has no risks for depression as noted by interview with patient and direct observation did not reveal any indication of such.  I also spoke to the pt. In detail regarding ways to prevent falls since she told me that her apt was very small and she also did not want a shower chair at this time.  I advised her to always ensure that she wore either shoes or some other type of foot covering with a grip/traction on the sole while in the house on her hard flooring and for lighted paths when getting up if dark.  She is dependent on her husband to assist her with showering and getting her to appts.  She states that she can drive and has a driver's license but having to load up all of her equipment  herself, makes it hard for her.  L. Referrals and appointments - She stated that since her husband still works (goes to other states to pickup cars that were  purchased at car auctions for a dealership in [place]), sometimes it is a burden for her to have to come to this office for her INR r/t to coumadin therapy.  She expressed that she would love it if we could check into getting a home health nurse to come to her and do her INR instead of her husband having to take so much time off of work to bring her to [place] to have it checked.  She states that they live approximately 40-45 minutes for the office here.  I told her we could check into this for her.  I will contact our care guide.  She declined her microalbumin and bone density scan at this time.  I can ask her again at a future visit if she wants these, if needed.   In addition, I have reviewed and discussed with patient certain preventive protocols, quality metrics, and best practice recommendations. A written personalized care plan for preventive services as well as general preventive health recommendations were provided to patient.  Signed,   Nestor Lewandowsky, RN  I have read and agreed with the contents of this note.  Iran Planas PA-C

## 2016-03-05 NOTE — Progress Notes (Addendum)
Subjective:     Patient ID: Becky Newton, female   DOB: September 29, 1933, 83 y.o.   MRN: [id number]  HPI This is a pleasant 83 year old female presenting for INR monitoring. Last week she states she experienced a nose bleed twice, 2 days apart. She states she stopped her baby aspirin for a few days and has not experienced a nose bleed since. Also complains of episodes of low blood pressure. States last week she had a reading of 101/48 and 98/38 and had associated weakness however the rest of the day her pressure was normal. When she feels like this she will eat something salty like potato chips or salted nuts and this seems to help. She reports she has not been taking lasix, but has been taking metoprolol 25 mg cut in half, and car dizem 180 mg daily.   INR 1.9 today. Reports taking Warfarin 6 mg on Monday and Saturday, and 4 mg every other day.    Review of Systems  Constitutional: Negative for appetite change, fatigue, fever and unexpected weight change.  HENT: Negative for congestion, rhinorrhea, sinus pressure, sneezing and sore throat.   Respiratory: Negative for cough, shortness of breath and wheezing.   Cardiovascular: Negative for chest pain and palpitations.  Gastrointestinal: Negative.   Genitourinary: Positive for frequency. Negative for dysuria, hematuria and urgency.  Musculoskeletal: Negative for arthralgias, myalgias and neck stiffness.  Neurological: Positive for light-headedness. Negative for dizziness, syncope and weakness.       Objective:   Physical Exam  Constitutional: She is oriented to person, place, and time. She appears well-developed and well-nourished. No distress.  HENT:  Head: Normocephalic and atraumatic.  Right Ear: External ear normal.  Left Ear: External ear normal.  Neck: Neck supple. No thyromegaly present.  Cardiovascular: Normal rate and regular rhythm.  Exam reveals no gallop and no friction rub.   No murmur heard. Pulmonary/Chest: Breath sounds  normal. She has no wheezes.  On supplemental oxygen.  Lymphadenopathy:    She has no cervical adenopathy.  Neurological: She is alert and oriented to person, place, and time.  Skin: Skin is warm and dry.  Psychiatric: She has a normal mood and affect. Her behavior is normal.       Assessment/Plan:      Marland KitchenMarland KitchenDiagnoses and all orders for this visit:  Paroxysmal atrial fibrillation ([place]) -     POCT INR  Nosebleed   Continue on ASA.  Discussed nasal dryness and O2 usage as a cause. Continue with humifier and nasal moisturizer.   hypotension: Discussed reduction of Cardizem to 140m with patient for episodes of hypotension, however agreed to try conservative treatment first including increased hydration and salt intake to avoid possibly causing episode of atrial fibrillation if dose is decreased. Instructed patient to bring her home blood pressure machine in next visit so it's readings could be compared with the office blood pressure machine.   Warfarin therapy: Given INR of 1.9, change warfarin dosing to 6 mg on Monday, Wednesday, and Saturday, and 4 mg the remaining days. Follow up in 2 weeks.

## 2016-03-05 NOTE — Addendum Note (Signed)
Addended by: Nevin Bloodgood D on: 03/05/2016 03:38 PM   Modules accepted: Orders

## 2016-03-05 NOTE — Patient Instructions (Addendum)
Please increase coumadin to 20m Monday, Wednesday and Saturday and 445mall other days. Recheck in 2 weeks.   Monitor BP and bring in wrist cuff in 2 weeks.

## 2016-03-06 DIAGNOSIS — J449 Chronic obstructive pulmonary disease, unspecified: Secondary | ICD-10-CM | POA: Diagnosis not present

## 2016-03-07 ENCOUNTER — Other Ambulatory Visit: Payer: Self-pay | Admitting: *Deleted

## 2016-03-07 MED ORDER — DILTIAZEM HCL ER COATED BEADS 180 MG PO CP24
180.0000 mg | ORAL_CAPSULE | Freq: Every day | ORAL | 4 refills | Status: DC
Start: 2016-03-07 — End: 2016-07-21

## 2016-03-16 ENCOUNTER — Encounter: Payer: Self-pay | Admitting: Physician Assistant

## 2016-03-16 ENCOUNTER — Ambulatory Visit: Payer: Commercial Managed Care - HMO | Admitting: Physician Assistant

## 2016-03-19 ENCOUNTER — Ambulatory Visit (INDEPENDENT_AMBULATORY_CARE_PROVIDER_SITE_OTHER): Payer: Medicare HMO | Admitting: Physician Assistant

## 2016-03-19 ENCOUNTER — Encounter: Payer: Self-pay | Admitting: Physician Assistant

## 2016-03-19 VITALS — BP 153/69 | HR 77 | Ht 62.0 in

## 2016-03-19 DIAGNOSIS — I480 Paroxysmal atrial fibrillation: Secondary | ICD-10-CM | POA: Diagnosis not present

## 2016-03-19 DIAGNOSIS — R040 Epistaxis: Secondary | ICD-10-CM | POA: Diagnosis not present

## 2016-03-19 LAB — POCT INR: INR: 2

## 2016-03-19 NOTE — Patient Instructions (Addendum)
59m Monday, Wednesday, Friday, Saturday, Sunday. Then 418mTuesday and Thursday.   14points to the top number  13 points to the bottom number

## 2016-03-19 NOTE — Progress Notes (Signed)
   Subjective:    Patient ID: Becky Newton, female    DOB: [birth year], 83 y.o.   MRN: [id number]  HPI  Pt presents into clinic for INR recheck. On warfarin 519m Monday, Wednesday, Friday, Saturday, Sunday. 438mevery other day.   Pt has had 2 more nosebleeds in last 2 weeks. Always left nare. Usually at bedtime when laying down. Continual O2 use. No using any nasal sprays/gel.      Review of Systems  All other systems reviewed and are negative.      Objective:   Physical Exam  Constitutional: She is oriented to person, place, and time. She appears well-developed and well-nourished.  HENT:  Head: Normocephalic and atraumatic.  Right Ear: External ear normal.  Nose: Nose normal.  No active bleeding in nares.   Cardiovascular: Normal rate, regular rhythm and normal heart sounds.   Pulmonary/Chest: Effort normal and breath sounds normal.  Neurological: She is alert and oriented to person, place, and time.  Psychiatric: She has a normal mood and affect. Her behavior is normal.          Assessment & Plan:  ..Marland KitchenMarland Kitchenagnoses and all orders for this visit:  [place]-     POCT INR  Recurrent epistaxis -     CBC with Differential/Platelet   .. Marland Kitchenesults for orders placed or performed in visit on 03/19/16  CBC with Differential/Platelet  Result Value Ref Range   WBC 9.4 3.8 - 10.8 K/uL   RBC 4.08 3.80 - 5.10 MIL/uL   Hemoglobin 11.6 (L) 11.7 - 15.5 g/dL   HCT 37.5 35.0 - 45.0 %   MCV 91.9 80.0 - 100.0 fL   MCH 28.4 27.0 - 33.0 pg   MCHC 30.9 (L) 32.0 - 36.0 g/dL   RDW 15.6 (H) 11.0 - 15.0 %   Platelets 220 140 - 400 K/uL   MPV 13.2 (H) 7.5 - 12.5 fL   Neutro Abs 6,204 1,500 - 7,800 cells/uL   Lymphs Abs 2,256 850 - 3,900 cells/uL   Monocytes Absolute 846 200 - 950 cells/uL   Eosinophils Absolute 94 15 - 500 cells/uL   Basophils Absolute 0 0 - 200 cells/uL   Neutrophils Relative % 66 %   Lymphocytes Relative 24 %   Monocytes Relative 9 %   Eosinophils Relative 1 %   Basophils Relative 0 %   Smear Review Criteria for review not met   POCT INR  Result Value Ref Range   INR 2.0    INR 2 increased to 19m20mvery day except 4mg69m Tuesday and Thursday. Recheck in 3 weeks.  INR is not causing nosebleeds.   Will send to ENT to consider cauterization. I do believe O2 is causing some dryness. Discussed nasal gel ayr. Will get cbc. Continue asa I don't think should be causing.   Brought in BP cuff. Home cuff is measuring 14 mmHg on top and 13mm65mn bottom lower than in office. Discussed I do not think BP dropping into 90's as previously discussed at last visit.

## 2016-03-20 ENCOUNTER — Encounter: Payer: Self-pay | Admitting: Physician Assistant

## 2016-03-20 LAB — CBC WITH DIFFERENTIAL/PLATELET
BASOS PCT: 0 %
Basophils Absolute: 0 cells/uL (ref 0–200)
EOS PCT: 1 %
Eosinophils Absolute: 94 cells/uL (ref 15–500)
HEMATOCRIT: 37.5 % (ref 35.0–45.0)
HEMOGLOBIN: 11.6 g/dL — AB (ref 11.7–15.5)
LYMPHS ABS: 2256 {cells}/uL (ref 850–3900)
Lymphocytes Relative: 24 %
MCH: 28.4 pg (ref 27.0–33.0)
MCHC: 30.9 g/dL — ABNORMAL LOW (ref 32.0–36.0)
MCV: 91.9 fL (ref 80.0–100.0)
MPV: 13.2 fL — AB (ref 7.5–12.5)
Monocytes Absolute: 846 cells/uL (ref 200–950)
Monocytes Relative: 9 %
NEUTROS ABS: 6204 {cells}/uL (ref 1500–7800)
Neutrophils Relative %: 66 %
Platelets: 220 10*3/uL (ref 140–400)
RBC: 4.08 MIL/uL (ref 3.80–5.10)
RDW: 15.6 % — ABNORMAL HIGH (ref 11.0–15.0)
WBC: 9.4 10*3/uL (ref 3.8–10.8)

## 2016-03-22 ENCOUNTER — Other Ambulatory Visit: Payer: Self-pay | Admitting: *Deleted

## 2016-03-22 MED ORDER — AMBULATORY NON FORMULARY MEDICATION: 11 refills | Status: DC

## 2016-03-24 DIAGNOSIS — Z9981 Dependence on supplemental oxygen: Secondary | ICD-10-CM | POA: Diagnosis not present

## 2016-03-24 DIAGNOSIS — Z91041 Radiographic dye allergy status: Secondary | ICD-10-CM | POA: Diagnosis not present

## 2016-03-24 DIAGNOSIS — J449 Chronic obstructive pulmonary disease, unspecified: Secondary | ICD-10-CM | POA: Diagnosis not present

## 2016-03-24 DIAGNOSIS — Z87891 Personal history of nicotine dependence: Secondary | ICD-10-CM | POA: Diagnosis not present

## 2016-03-24 DIAGNOSIS — J189 Pneumonia, unspecified organism: Secondary | ICD-10-CM | POA: Diagnosis not present

## 2016-03-24 DIAGNOSIS — R0602 Shortness of breath: Secondary | ICD-10-CM | POA: Diagnosis not present

## 2016-03-24 DIAGNOSIS — R51 Headache: Secondary | ICD-10-CM | POA: Diagnosis not present

## 2016-03-24 DIAGNOSIS — Z7984 Long term (current) use of oral hypoglycemic drugs: Secondary | ICD-10-CM | POA: Diagnosis not present

## 2016-03-24 DIAGNOSIS — R5383 Other fatigue: Secondary | ICD-10-CM | POA: Diagnosis not present

## 2016-03-24 DIAGNOSIS — J90 Pleural effusion, not elsewhere classified: Secondary | ICD-10-CM | POA: Diagnosis not present

## 2016-03-24 DIAGNOSIS — H409 Unspecified glaucoma: Secondary | ICD-10-CM | POA: Diagnosis not present

## 2016-03-24 DIAGNOSIS — R600 Localized edema: Secondary | ICD-10-CM | POA: Diagnosis not present

## 2016-03-24 DIAGNOSIS — Z7901 Long term (current) use of anticoagulants: Secondary | ICD-10-CM | POA: Diagnosis not present

## 2016-03-24 DIAGNOSIS — J09X1 Influenza due to identified novel influenza A virus with pneumonia: Secondary | ICD-10-CM | POA: Diagnosis not present

## 2016-03-24 DIAGNOSIS — E119 Type 2 diabetes mellitus without complications: Secondary | ICD-10-CM | POA: Diagnosis not present

## 2016-03-24 DIAGNOSIS — Z79899 Other long term (current) drug therapy: Secondary | ICD-10-CM | POA: Diagnosis not present

## 2016-03-24 DIAGNOSIS — E669 Obesity, unspecified: Secondary | ICD-10-CM | POA: Diagnosis not present

## 2016-03-24 DIAGNOSIS — I4891 Unspecified atrial fibrillation: Secondary | ICD-10-CM | POA: Diagnosis not present

## 2016-03-29 ENCOUNTER — Other Ambulatory Visit: Payer: Self-pay | Admitting: *Deleted

## 2016-03-29 ENCOUNTER — Other Ambulatory Visit: Payer: Self-pay | Admitting: Physician Assistant

## 2016-03-29 MED ORDER — OMEPRAZOLE 20 MG PO CPDR: 20.0000 mg | DELAYED_RELEASE_CAPSULE | Freq: Two times a day (BID) | ORAL | 3 refills | Status: DC

## 2016-04-03 DIAGNOSIS — J449 Chronic obstructive pulmonary disease, unspecified: Secondary | ICD-10-CM | POA: Diagnosis not present

## 2016-04-09 ENCOUNTER — Telehealth: Payer: Self-pay

## 2016-04-09 ENCOUNTER — Ambulatory Visit (INDEPENDENT_AMBULATORY_CARE_PROVIDER_SITE_OTHER): Payer: Medicare HMO | Admitting: Physician Assistant

## 2016-04-09 DIAGNOSIS — I480 Paroxysmal atrial fibrillation: Secondary | ICD-10-CM

## 2016-04-09 LAB — POCT INR: INR: 3

## 2016-04-09 MED ORDER — WARFARIN SODIUM 4 MG PO TABS: 4.0000 mg | ORAL_TABLET | Freq: Every day | ORAL | 2 refills | Status: DC

## 2016-04-09 MED ORDER — WARFARIN SODIUM 6 MG PO TABS: ORAL_TABLET | ORAL | 2 refills | Status: DC

## 2016-04-09 NOTE — Telephone Encounter (Signed)
Pt called and wanted clarification on warfarin.  Spoke with Iran Planas, PA and she said patient is to stay on the same dosage as previously 6 mg everyday except Tuesday and Thursday 61m.  Pt is to return in 2 weeks for INR.  Pt notified.

## 2016-04-09 NOTE — Progress Notes (Addendum)
Pt informed. Pt expressed understanding and is agreeable. Bobetta Lime CMA, RT  Stay on same dose of coumadin since continues to be in normal range. Follow up in 2 weeks for recheck.   Stay on same dose of blood pressure medications. Recheck in 2 weeks. Iran Planas PA-C

## 2016-04-09 NOTE — Telephone Encounter (Signed)
Discussed with amber. She is updating anticoag chart with how patient is taking.

## 2016-04-16 DIAGNOSIS — E040 Nontoxic diffuse goiter: Secondary | ICD-10-CM | POA: Diagnosis not present

## 2016-04-18 DIAGNOSIS — R079 Chest pain, unspecified: Secondary | ICD-10-CM | POA: Diagnosis not present

## 2016-04-18 DIAGNOSIS — R918 Other nonspecific abnormal finding of lung field: Secondary | ICD-10-CM | POA: Diagnosis not present

## 2016-04-18 DIAGNOSIS — R002 Palpitations: Secondary | ICD-10-CM | POA: Diagnosis not present

## 2016-04-18 DIAGNOSIS — Z7984 Long term (current) use of oral hypoglycemic drugs: Secondary | ICD-10-CM | POA: Diagnosis not present

## 2016-04-18 DIAGNOSIS — Z7901 Long term (current) use of anticoagulants: Secondary | ICD-10-CM | POA: Diagnosis not present

## 2016-04-18 DIAGNOSIS — J449 Chronic obstructive pulmonary disease, unspecified: Secondary | ICD-10-CM | POA: Diagnosis not present

## 2016-04-18 DIAGNOSIS — Z79899 Other long term (current) drug therapy: Secondary | ICD-10-CM | POA: Diagnosis not present

## 2016-04-18 DIAGNOSIS — R600 Localized edema: Secondary | ICD-10-CM | POA: Diagnosis not present

## 2016-04-18 DIAGNOSIS — Z91041 Radiographic dye allergy status: Secondary | ICD-10-CM | POA: Diagnosis not present

## 2016-04-18 DIAGNOSIS — I480 Paroxysmal atrial fibrillation: Secondary | ICD-10-CM | POA: Diagnosis not present

## 2016-04-18 DIAGNOSIS — Z9981 Dependence on supplemental oxygen: Secondary | ICD-10-CM | POA: Diagnosis not present

## 2016-04-18 DIAGNOSIS — Z87891 Personal history of nicotine dependence: Secondary | ICD-10-CM | POA: Diagnosis not present

## 2016-04-18 DIAGNOSIS — Z7982 Long term (current) use of aspirin: Secondary | ICD-10-CM | POA: Diagnosis not present

## 2016-04-18 DIAGNOSIS — E119 Type 2 diabetes mellitus without complications: Secondary | ICD-10-CM | POA: Diagnosis not present

## 2016-04-23 ENCOUNTER — Ambulatory Visit (INDEPENDENT_AMBULATORY_CARE_PROVIDER_SITE_OTHER): Payer: Medicare HMO | Admitting: Physician Assistant

## 2016-04-23 ENCOUNTER — Ambulatory Visit: Payer: Medicare HMO | Admitting: Physician Assistant

## 2016-04-23 VITALS — BP 138/70 | HR 64

## 2016-04-23 DIAGNOSIS — I480 Paroxysmal atrial fibrillation: Secondary | ICD-10-CM | POA: Diagnosis not present

## 2016-04-23 LAB — POCT INR: INR: 3.1

## 2016-04-23 NOTE — Progress Notes (Signed)
   Subjective:    Patient ID: Becky Newton, female    DOB: 10-19-1933, 83 y.o.   MRN: [id number]  HPI  Pt is a 83 yo female who presents to the clinic for hospital follow up from 3/28 visit for atrial fibrilation. That night she went to bed and felt like heart was out of rhythm and chest discomfort. She took 2 nitro and doubled cardizem. By the time she got to the hospital she was back in rhythm. She is not having any more problems. BP was in the 153/70 at ER.    Review of Systems See HPI.     Objective:   Physical Exam  Constitutional: She is oriented to person, place, and time. She appears well-developed and well-nourished.  Cardiovascular: Normal rate, regular rhythm and normal heart sounds.   Pulmonary/Chest: Effort normal and breath sounds normal.  Neurological: She is alert and oriented to person, place, and time.  Psychiatric: She has a normal mood and affect. Her behavior is normal.          Assessment & Plan:  Marland KitchenMarland KitchenDiagnoses and all orders for this visit:  Paroxysmal atrial fibrillation ([place])   Continue on same treatment plan. Follow up in 3 months. See INR visit with changes made.

## 2016-04-23 NOTE — Progress Notes (Signed)
   Subjective:    Patient ID: Becky Newton, female    DOB: Sep 09, 1933, 83 y.o.   MRN: [id number]  HPI    Review of Systems     Objective:   Physical Exam        Assessment & Plan:  Decreased dose to:  58m on Tues, Thursday and Sunday.; 625mon all other days.  Recheck in 2 weeks. JaIran PlanasA-C

## 2016-04-23 NOTE — Patient Instructions (Signed)
Decreased dose to:  91m on Tues, Thursday and Sunday.; 617mon all other days.  Recheck in 2 weeks. JaIran PlanasA-C

## 2016-05-04 DIAGNOSIS — J449 Chronic obstructive pulmonary disease, unspecified: Secondary | ICD-10-CM | POA: Diagnosis not present

## 2016-05-07 ENCOUNTER — Ambulatory Visit (INDEPENDENT_AMBULATORY_CARE_PROVIDER_SITE_OTHER): Payer: Medicare HMO | Admitting: Sports Medicine

## 2016-05-07 DIAGNOSIS — I480 Paroxysmal atrial fibrillation: Secondary | ICD-10-CM | POA: Diagnosis not present

## 2016-05-07 LAB — POCT INR: INR: 1.9

## 2016-05-07 NOTE — Progress Notes (Signed)
Patient's husband advised of recommendations.

## 2016-05-10 ENCOUNTER — Encounter: Payer: Self-pay | Admitting: Physician Assistant

## 2016-05-11 ENCOUNTER — Encounter: Payer: Self-pay | Admitting: Physician Assistant

## 2016-05-21 ENCOUNTER — Ambulatory Visit (INDEPENDENT_AMBULATORY_CARE_PROVIDER_SITE_OTHER): Payer: Medicare HMO | Admitting: Physician Assistant

## 2016-05-21 ENCOUNTER — Telehealth: Payer: Self-pay

## 2016-05-21 DIAGNOSIS — I480 Paroxysmal atrial fibrillation: Secondary | ICD-10-CM | POA: Diagnosis not present

## 2016-05-21 LAB — POCT INR: INR: 4

## 2016-05-21 NOTE — Progress Notes (Signed)
Pt reports taking 40m Thursday and Sunday, 681mMonday, Tuesday, Wednesday, Friday and Saturday.  KB  Decrease dose 58m68mn Thursday and Saturday and Sunday ; 6mg5m all other days.  Recheck in 2 weeks. Once xarelto is in we have to check INR until under 3 then can start xarelto 20mg22mly. Jade Iran Planas

## 2016-05-21 NOTE — Telephone Encounter (Signed)
Pt would like to change to [place].  If so can you send it to [facility].

## 2016-05-22 ENCOUNTER — Encounter: Payer: Self-pay | Admitting: Physician Assistant

## 2016-05-22 MED ORDER — RIVAROXABAN 20 MG PO TABS: 20.0000 mg | ORAL_TABLET | Freq: Every day | ORAL | 1 refills | Status: DC

## 2016-05-22 NOTE — Telephone Encounter (Signed)
I have sent to pharmacy.

## 2016-05-22 NOTE — Telephone Encounter (Signed)
Notified patient.

## 2016-05-29 ENCOUNTER — Encounter: Payer: Self-pay | Admitting: Physician Assistant

## 2016-05-30 ENCOUNTER — Encounter: Payer: Self-pay | Admitting: Physician Assistant

## 2016-05-31 ENCOUNTER — Ambulatory Visit (INDEPENDENT_AMBULATORY_CARE_PROVIDER_SITE_OTHER): Payer: Medicare HMO | Admitting: Family Medicine

## 2016-05-31 DIAGNOSIS — I480 Paroxysmal atrial fibrillation: Secondary | ICD-10-CM | POA: Diagnosis not present

## 2016-05-31 LAB — POCT INR: INR: 3.4

## 2016-05-31 NOTE — Progress Notes (Signed)
Lab Results  Component Value Date   INR 3.4 05/31/2016   INR 4.0 05/21/2016   INR 1.9 05/07/2016   Follow up with Luvenia Starch

## 2016-05-31 NOTE — Progress Notes (Signed)
Patient anxious to get back on Xaralto; only has a few coumadins in 15m and 485mdoses, and does not want to refill them. pk

## 2016-06-01 ENCOUNTER — Encounter: Payer: Self-pay | Admitting: Physician Assistant

## 2016-06-03 DIAGNOSIS — J449 Chronic obstructive pulmonary disease, unspecified: Secondary | ICD-10-CM | POA: Diagnosis not present

## 2016-06-04 ENCOUNTER — Ambulatory Visit (INDEPENDENT_AMBULATORY_CARE_PROVIDER_SITE_OTHER): Payer: Medicare HMO | Admitting: Physician Assistant

## 2016-06-04 DIAGNOSIS — I480 Paroxysmal atrial fibrillation: Secondary | ICD-10-CM

## 2016-06-04 LAB — POCT INR: INR: 1.6

## 2016-06-07 ENCOUNTER — Telehealth: Payer: Self-pay | Admitting: *Deleted

## 2016-06-07 NOTE — Telephone Encounter (Signed)
I cannot closed this patients encounter. Its been routed to you but I cant get it out of my basket. It says a return date must be entered. I have done that but still wont close. Can you look and see if there is something you as the provider has to add?

## 2016-06-07 NOTE — Progress Notes (Signed)
The patient will be starting Xarelto which does not require weekly visits.

## 2016-06-08 ENCOUNTER — Encounter: Payer: Self-pay | Admitting: Physician Assistant

## 2016-06-08 NOTE — Telephone Encounter (Signed)
I went in and closed encounter. Let me know if this helped.

## 2016-06-12 ENCOUNTER — Ambulatory Visit (INDEPENDENT_AMBULATORY_CARE_PROVIDER_SITE_OTHER): Payer: Medicare HMO | Admitting: Physician Assistant

## 2016-06-12 ENCOUNTER — Encounter: Payer: Self-pay | Admitting: Physician Assistant

## 2016-06-12 VITALS — BP 141/70 | HR 83 | Ht 62.0 in

## 2016-06-12 DIAGNOSIS — J441 Chronic obstructive pulmonary disease with (acute) exacerbation: Secondary | ICD-10-CM | POA: Diagnosis not present

## 2016-06-12 DIAGNOSIS — J9611 Chronic respiratory failure with hypoxia: Secondary | ICD-10-CM

## 2016-06-12 MED ORDER — METHYLPREDNISOLONE SODIUM SUCC 125 MG IJ SOLR
125.0000 mg | Freq: Once | INTRAMUSCULAR | Status: AC
  Administered 2016-06-12: 125 mg via INTRAMUSCULAR

## 2016-06-12 MED ORDER — AZITHROMYCIN 250 MG PO TABS: ORAL_TABLET | ORAL | 0 refills | Status: DC

## 2016-06-12 MED ORDER — HYDROCODONE-HOMATROPINE 5-1.5 MG/5ML PO SYRP: 5.0000 mL | ORAL_SOLUTION | Freq: Every evening | ORAL | 0 refills | Status: DC | PRN

## 2016-06-12 MED ORDER — METHYLPREDNISOLONE ACETATE 40 MG/ML IJ SUSP
40.0000 mg | Freq: Once | INTRAMUSCULAR | Status: AC
  Administered 2016-06-12: 40 mg via INTRAMUSCULAR

## 2016-06-12 NOTE — Progress Notes (Signed)
Subjective:    Patient ID: Becky Newton, female    DOB: 08-Jun-1933, 83 y.o.   MRN: [id number]  HPI  Pt is a 83 yo female with hx of chronic respiratory failure, COPD and on 2L of continuous O2 who presents to the clinic with productive cough, wheezing, SOB, fatigue for last 4 days. Her husband has also been sick. She has not been able to sleep laying down for the past few nights. She has tried mucinex, tessalon pearles, OTC cough syrup without any benefits. She denies any fever. She has had some chills.   .. Active Ambulatory Problems    Diagnosis Date Noted  . Asthma, chronic 08/24/2014  . Type 2 diabetes mellitus ([place]) 08/24/2014  . History of gastric ulcer 08/24/2014  . Paralysis, diaphragm 08/24/2014  . History of Nissen fundoplication 89/37/3428  . Pulmonary hypertension due to lung disease ([place]) 08/24/2014  . Open-angle glaucoma 08/24/2014  . Chronic respiratory failure ([place]) 09/02/2014  . OSA (obstructive sleep apnea) 09/02/2014  . Hypercalcemia 09/02/2014  . Paroxysmal atrial fibrillation ([place]) 11/03/2014  . Chest pain 11/03/2014  . Morbid obesity ([place]) 11/03/2014  . Overactive bladder 08/19/2015  . Lower extremity edema 08/19/2015  . Stasis dermatitis 08/19/2015  . Hematoma of leg, right, initial encounter 11/29/2015  . Ischemic optic neuropathy of right eye 11/29/2015  . Vitreous degeneration, bilateral 11/29/2015  . Nosebleed 03/05/2016   Resolved Ambulatory Problems    Diagnosis Date Noted  . No Resolved Ambulatory Problems   Past Medical History:  Diagnosis Date  . Atrial fibrillation ([place])   . COPD (chronic obstructive pulmonary disease) ([place])   . Diabetes ([place])   . Fibrocystic breast determined by biopsy 1977  . H/O hernia repair 2006  . H/O left breast biopsy 1982  . Incisional hernia   . Lung disease   . S/P scar revision     Review of Systems See HPI.     Objective:   Physical Exam  Constitutional: She is oriented to person, place, and time.  She appears well-developed and well-nourished.  HENT:  Head: Normocephalic and atraumatic.  Right Ear: External ear normal.  Left Ear: External ear normal.  Oropharynx erythematous.   Eyes:  Watery discharge from both eyes.   Neck: Normal range of motion. Neck supple.  Cardiovascular: Normal rate, regular rhythm and normal heart sounds.   Pulmonary/Chest:  Bilateral wheezing and rhonchi.   Lymphadenopathy:    She has cervical adenopathy.  Neurological: She is alert and oriented to person, place, and time.  Psychiatric: She has a normal mood and affect. Her behavior is normal.          Assessment & Plan:  Marland KitchenMarland KitchenEarline was seen today for cough and fatigue.  Diagnoses and all orders for this visit:  COPD exacerbation ([place]) -     azithromycin (ZITHROMAX) 250 MG tablet; Take 2 tablet now and then one tablet a day for 4 days. -     HYDROcodone-homatropine (HYCODAN) 5-1.5 MG/5ML syrup; Take 5 mLs by mouth at bedtime as needed. -     methylPREDNISolone sodium succinate (SOLU-MEDROL) 125 mg/2 mL injection 125 mg; Inject 2 mLs (125 mg total) into the muscle once. -     methylPREDNISolone acetate (DEPO-MEDROL) injection 40 mg; Inject 1 mL (40 mg total) into the muscle once.  Chronic respiratory failure with hypoxia (HCC) -     methylPREDNISolone sodium succinate (SOLU-MEDROL) 125 mg/2 mL injection 125 mg; Inject 2 mLs (125 mg total) into the muscle once. -  methylPREDNISolone acetate (DEPO-MEDROL) injection 40 mg; Inject 1 mL (40 mg total) into the muscle once.   Cough syrup only at bedtime.  [place] controlled substance database reviewed.  abx given today.  Continue duoneb 4 times a day. Continue mucinex.  If no improvement by Wednesday afternoon will send over prednisone orally as well.

## 2016-06-13 ENCOUNTER — Encounter: Payer: Self-pay | Admitting: Physician Assistant

## 2016-06-13 ENCOUNTER — Other Ambulatory Visit: Payer: Self-pay | Admitting: Physician Assistant

## 2016-06-13 MED ORDER — PREDNISONE 20 MG PO TABS
ORAL_TABLET | ORAL | 0 refills | Status: DC
Start: 2016-06-13 — End: 2016-06-25

## 2016-06-15 ENCOUNTER — Encounter: Payer: Self-pay | Admitting: Physician Assistant

## 2016-06-15 ENCOUNTER — Ambulatory Visit: Payer: Medicare HMO | Admitting: Physician Assistant

## 2016-06-20 ENCOUNTER — Ambulatory Visit (INDEPENDENT_AMBULATORY_CARE_PROVIDER_SITE_OTHER): Payer: Medicare HMO | Admitting: Physician Assistant

## 2016-06-20 ENCOUNTER — Encounter: Payer: Self-pay | Admitting: Physician Assistant

## 2016-06-20 ENCOUNTER — Ambulatory Visit (INDEPENDENT_AMBULATORY_CARE_PROVIDER_SITE_OTHER): Payer: Medicare HMO

## 2016-06-20 VITALS — BP 155/79 | HR 87 | Ht 62.0 in

## 2016-06-20 DIAGNOSIS — R05 Cough: Secondary | ICD-10-CM | POA: Diagnosis not present

## 2016-06-20 DIAGNOSIS — R062 Wheezing: Secondary | ICD-10-CM | POA: Diagnosis not present

## 2016-06-20 DIAGNOSIS — R0602 Shortness of breath: Secondary | ICD-10-CM

## 2016-06-20 DIAGNOSIS — J9611 Chronic respiratory failure with hypoxia: Secondary | ICD-10-CM | POA: Diagnosis not present

## 2016-06-20 DIAGNOSIS — J441 Chronic obstructive pulmonary disease with (acute) exacerbation: Secondary | ICD-10-CM | POA: Diagnosis not present

## 2016-06-20 DIAGNOSIS — R059 Cough, unspecified: Secondary | ICD-10-CM

## 2016-06-20 MED ORDER — OMEPRAZOLE 20 MG PO CPDR: 20.0000 mg | DELAYED_RELEASE_CAPSULE | Freq: Two times a day (BID) | ORAL | 3 refills | Status: DC

## 2016-06-20 MED ORDER — BENZONATATE 200 MG PO CAPS: 200.0000 mg | ORAL_CAPSULE | Freq: Three times a day (TID) | ORAL | 2 refills | Status: DC | PRN

## 2016-06-20 MED ORDER — METHYLPREDNISOLONE SODIUM SUCC 125 MG IJ SOLR
125.0000 mg | Freq: Once | INTRAMUSCULAR | Status: AC
  Administered 2016-06-20: 125 mg via INTRAMUSCULAR

## 2016-06-20 MED ORDER — IPRATROPIUM-ALBUTEROL 0.5-2.5 (3) MG/3ML IN SOLN: 3.0000 mL | RESPIRATORY_TRACT | 3 refills | Status: DC | PRN

## 2016-06-20 MED ORDER — METHYLPREDNISOLONE ACETATE 40 MG/ML IJ SUSP
40.0000 mg | Freq: Once | INTRAMUSCULAR | Status: AC
  Administered 2016-06-20: 40 mg via INTRAMUSCULAR

## 2016-06-20 MED ORDER — LEVOFLOXACIN 750 MG PO TABS: 750.0000 mg | ORAL_TABLET | Freq: Every day | ORAL | 0 refills | Status: DC

## 2016-06-21 ENCOUNTER — Encounter: Payer: Self-pay | Admitting: Physician Assistant

## 2016-06-21 NOTE — Progress Notes (Signed)
Subjective:    Patient ID: Becky Newton, female    DOB: [birth year], 83 y.o.   MRN: [id number]  HPI Pt is a 83 yo female with atrial fibrillation, DM, COPD who presents to the clinic to follow up from COPD exacerbation. She had solumedrol and depo medrol over a week ago and started zpak. She has finished zpak. She is using nebulizer 4-5 times a day. She was getting a little better but then felt like she was getting worse again. No fever. She is very SOB and has a lot of chest tightness. She is on 2 to 3 liters of O2 all the time. She just finished oral prednisone.   .. Active Ambulatory Problems    Diagnosis Date Noted  . Asthma, chronic 08/24/2014  . Type 2 diabetes mellitus ([place]) 08/24/2014  . History of gastric ulcer 08/24/2014  . Paralysis, diaphragm 08/24/2014  . History of Nissen fundoplication 81/19/1478  . Pulmonary hypertension due to lung disease ([place]) 08/24/2014  . Open-angle glaucoma 08/24/2014  . Chronic respiratory failure ([place]) 09/02/2014  . OSA (obstructive sleep apnea) 09/02/2014  . Hypercalcemia 09/02/2014  . Paroxysmal atrial fibrillation ([place]) 11/03/2014  . Chest pain 11/03/2014  . Morbid obesity ([place]) 11/03/2014  . Overactive bladder 08/19/2015  . Lower extremity edema 08/19/2015  . Stasis dermatitis 08/19/2015  . Hematoma of leg, right, initial encounter 11/29/2015  . Ischemic optic neuropathy of right eye 11/29/2015  . Vitreous degeneration, bilateral 11/29/2015  . Nosebleed 03/05/2016   Resolved Ambulatory Problems    Diagnosis Date Noted  . No Resolved Ambulatory Problems   Past Medical History:  Diagnosis Date  . Atrial fibrillation ([place])   . COPD (chronic obstructive pulmonary disease) ([place])   . Diabetes ([place])   . Fibrocystic breast determined by biopsy 1977  . H/O hernia repair 2006  . H/O left breast biopsy 1982  . Incisional hernia   . Lung disease   . S/P scar revision        Review of Systems See HPI.     Objective:   Physical  Exam  Constitutional: She is oriented to person, place, and time. She appears well-developed and well-nourished.  Non-ambulatory. Weak appearance. Non-toxic.   HENT:  Head: Normocephalic and atraumatic.  Right Ear: External ear normal.  Left Ear: External ear normal.  Nose: Nose normal.  Mouth/Throat: Oropharynx is clear and moist. No oropharyngeal exudate.  Cardiovascular: Regular rhythm and normal heart sounds.   Pulmonary/Chest:  Decreased effort. 2-3L of O2.  Rhonchi and wheezing bilateral lungs.  continuous coughing.   Neurological: She is alert and oriented to person, place, and time.  Psychiatric: She has a normal mood and affect. Her behavior is normal.          Assessment & Plan:  Marland KitchenMarland KitchenDiagnoses and all orders for this visit:  COPD exacerbation ([place]) -     ipratropium-albuterol (DUONEB) 0.5-2.5 (3) MG/3ML SOLN; Take 3 mLs by nebulization every 4 (four) hours as needed. -     benzonatate (TESSALON) 200 MG capsule; Take 1 capsule (200 mg total) by mouth 3 (three) times daily as needed for cough. -     levofloxacin (LEVAQUIN) 750 MG tablet; Take 1 tablet (750 mg total) by mouth daily. For 5 days. -     methylPREDNISolone sodium succinate (SOLU-MEDROL) 125 mg/2 mL injection 125 mg; Inject 2 mLs (125 mg total) into the muscle once. -     methylPREDNISolone acetate (DEPO-MEDROL) injection 40 mg; Inject 1 mL (40 mg total)  into the muscle once.  Cough -     ipratropium-albuterol (DUONEB) 0.5-2.5 (3) MG/3ML SOLN; Take 3 mLs by nebulization every 4 (four) hours as needed. -     benzonatate (TESSALON) 200 MG capsule; Take 1 capsule (200 mg total) by mouth 3 (three) times daily as needed for cough. -     levofloxacin (LEVAQUIN) 750 MG tablet; Take 1 tablet (750 mg total) by mouth daily. For 5 days. -     DG Chest 2 View -     methylPREDNISolone sodium succinate (SOLU-MEDROL) 125 mg/2 mL injection 125 mg; Inject 2 mLs (125 mg total) into the muscle once. -     methylPREDNISolone  acetate (DEPO-MEDROL) injection 40 mg; Inject 1 mL (40 mg total) into the muscle once.  Wheezing -     ipratropium-albuterol (DUONEB) 0.5-2.5 (3) MG/3ML SOLN; Take 3 mLs by nebulization every 4 (four) hours as needed. -     benzonatate (TESSALON) 200 MG capsule; Take 1 capsule (200 mg total) by mouth 3 (three) times daily as needed for cough. -     levofloxacin (LEVAQUIN) 750 MG tablet; Take 1 tablet (750 mg total) by mouth daily. For 5 days. -     DG Chest 2 View -     methylPREDNISolone sodium succinate (SOLU-MEDROL) 125 mg/2 mL injection 125 mg; Inject 2 mLs (125 mg total) into the muscle once. -     methylPREDNISolone acetate (DEPO-MEDROL) injection 40 mg; Inject 1 mL (40 mg total) into the muscle once.  SOB (shortness of breath) -     ipratropium-albuterol (DUONEB) 0.5-2.5 (3) MG/3ML SOLN; Take 3 mLs by nebulization every 4 (four) hours as needed. -     benzonatate (TESSALON) 200 MG capsule; Take 1 capsule (200 mg total) by mouth 3 (three) times daily as needed for cough. -     levofloxacin (LEVAQUIN) 750 MG tablet; Take 1 tablet (750 mg total) by mouth daily. For 5 days. -     DG Chest 2 View -     methylPREDNISolone sodium succinate (SOLU-MEDROL) 125 mg/2 mL injection 125 mg; Inject 2 mLs (125 mg total) into the muscle once. -     methylPREDNISolone acetate (DEPO-MEDROL) injection 40 mg; Inject 1 mL (40 mg total) into the muscle once.  Chronic respiratory failure with hypoxia (HCC) -     ipratropium-albuterol (DUONEB) 0.5-2.5 (3) MG/3ML SOLN; Take 3 mLs by nebulization every 4 (four) hours as needed. -     benzonatate (TESSALON) 200 MG capsule; Take 1 capsule (200 mg total) by mouth 3 (three) times daily as needed for cough. -     levofloxacin (LEVAQUIN) 750 MG tablet; Take 1 tablet (750 mg total) by mouth daily. For 5 days. -     DG Chest 2 View -     methylPREDNISolone sodium succinate (SOLU-MEDROL) 125 mg/2 mL injection 125 mg; Inject 2 mLs (125 mg total) into the muscle once. -      methylPREDNISolone acetate (DEPO-MEDROL) injection 40 mg; Inject 1 mL (40 mg total) into the muscle once.  Other orders -     omeprazole (PRILOSEC) 20 MG capsule; Take 1 capsule (20 mg total) by mouth 2 (two) times daily before a meal.   CXR showed no pneumonia. Treated with solumedrol/depomedrol. Started levaquin. Continue neb 4 times a day. Add mucinex twice a day. Continue O2 and push up to 3L for now. Follow up on Friday. Call with any worsening symptoms.

## 2016-06-25 ENCOUNTER — Ambulatory Visit (INDEPENDENT_AMBULATORY_CARE_PROVIDER_SITE_OTHER): Payer: Medicare HMO | Admitting: Physician Assistant

## 2016-06-25 ENCOUNTER — Encounter: Payer: Self-pay | Admitting: Physician Assistant

## 2016-06-25 VITALS — BP 166/72 | HR 87 | Ht 62.0 in

## 2016-06-25 DIAGNOSIS — J986 Disorders of diaphragm: Secondary | ICD-10-CM | POA: Diagnosis not present

## 2016-06-25 DIAGNOSIS — J9611 Chronic respiratory failure with hypoxia: Secondary | ICD-10-CM

## 2016-06-25 DIAGNOSIS — H401133 Primary open-angle glaucoma, bilateral, severe stage: Secondary | ICD-10-CM | POA: Diagnosis not present

## 2016-06-25 DIAGNOSIS — J4541 Moderate persistent asthma with (acute) exacerbation: Secondary | ICD-10-CM | POA: Diagnosis not present

## 2016-06-25 MED ORDER — BUDESONIDE-FORMOTEROL FUMARATE 160-4.5 MCG/ACT IN AERO: 2.0000 | INHALATION_SPRAY | Freq: Two times a day (BID) | RESPIRATORY_TRACT | 3 refills | Status: DC

## 2016-06-25 NOTE — Patient Instructions (Signed)
mucinex 688m twice a day.  symicort 2 puffs twice a day.

## 2016-06-25 NOTE — Progress Notes (Signed)
Subjective:    Patient ID: Becky Newton, female    DOB: December 03, 1933, 83 y.o.   MRN: [id number]  HPI Pt is a 83 yo female with chronic respiratory failure and asthma who presents to the clinic for follow up on breathing. She has finished zpak and levaquin. Xray confirmed no pneumonia or pleural effusion. She has had solumedrol, depo medrol and oral prednisone. She is doing much better but still feels like she has chest congestion and coughing. She uses hycodan at bedtime and helps to get some rest but she still can't sleeping lying down. Tessalon pearls help some during the day. She is on 3L of O2 right now. No fever, chills, body aches. She is doing duoneb at least 4 times a day.  She is not on a daily inhaler. She used to be on Advair a while back and Dr. Ileene Newton took her off since she was doing so well.   .. Active Ambulatory Problems    Diagnosis Date Noted  . Asthma, chronic 08/24/2014  . Type 2 diabetes mellitus ([place]) 08/24/2014  . History of gastric ulcer 08/24/2014  . Paralysis, diaphragm 08/24/2014  . History of Nissen fundoplication 23/76/2831  . Pulmonary hypertension due to lung disease ([place]) 08/24/2014  . Open-angle glaucoma 08/24/2014  . Chronic respiratory failure ([place]) 09/02/2014  . OSA (obstructive sleep apnea) 09/02/2014  . Hypercalcemia 09/02/2014  . Paroxysmal atrial fibrillation ([place]) 11/03/2014  . Chest pain 11/03/2014  . Morbid obesity ([place]) 11/03/2014  . Overactive bladder 08/19/2015  . Lower extremity edema 08/19/2015  . Stasis dermatitis 08/19/2015  . Hematoma of leg, right, initial encounter 11/29/2015  . Ischemic optic neuropathy of right eye 11/29/2015  . Vitreous degeneration, bilateral 11/29/2015  . Nosebleed 03/05/2016   Resolved Ambulatory Problems    Diagnosis Date Noted  . No Resolved Ambulatory Problems   Past Medical History:  Diagnosis Date  . Atrial fibrillation ([place])   . COPD (chronic obstructive pulmonary disease) ([place])   . Diabetes  ([place])   . Fibrocystic breast determined by biopsy 1977  . H/O hernia repair 2006  . H/O left breast biopsy 1982  . Incisional hernia   . Lung disease   . S/P scar revision       Review of Systems See HPI.     Objective:   Physical Exam  Constitutional: She is oriented to person, place, and time. She appears well-developed.  No ambulatory in a power wheelchair.   HENT:  Head: Normocephalic and atraumatic.  Cardiovascular: Normal rate, regular rhythm and normal heart sounds.   Pulmonary/Chest:  Decreased effort.  Bilateral lung wheezing with scattered rhonchi.  No crackles.   Neurological: She is alert and oriented to person, place, and time.  Psychiatric: She has a normal mood and affect. Her behavior is normal.          Assessment & Plan:  Marland KitchenMarland KitchenThecla was seen today for cough.  Diagnoses and all orders for this visit:  Moderate persistent asthma with exacerbation  Chronic respiratory failure with hypoxia (HCC)  Paralysis, diaphragm  Other orders -     budesonide-formoterol (SYMBICORT) 160-4.5 MCG/ACT inhaler; Inhale 2 puffs into the lungs 2 (two) times daily.   Continue on 3L but discussed as she starts to feel better to have episodes where she goes back to 2L.  Start symbicort. Discussed how to use and to rinse mouth out after use to avoid thrush. Follow up in 2 months.  Continue to use duoneb 4 times a day.  Add back mucinex 665m twice a day.  Stay hydrated. Vitals stable today.  Follow up or call if not improving in 3 days.

## 2016-06-26 ENCOUNTER — Encounter: Payer: Self-pay | Admitting: Physician Assistant

## 2016-07-04 DIAGNOSIS — J449 Chronic obstructive pulmonary disease, unspecified: Secondary | ICD-10-CM | POA: Diagnosis not present

## 2016-07-05 ENCOUNTER — Encounter: Payer: Self-pay | Admitting: Physician Assistant

## 2016-07-06 ENCOUNTER — Encounter: Payer: Self-pay | Admitting: Physician Assistant

## 2016-07-21 ENCOUNTER — Other Ambulatory Visit: Payer: Self-pay | Admitting: Physician Assistant

## 2016-08-03 DIAGNOSIS — J449 Chronic obstructive pulmonary disease, unspecified: Secondary | ICD-10-CM | POA: Diagnosis not present

## 2016-08-13 ENCOUNTER — Encounter: Payer: Self-pay | Admitting: Adult Health

## 2016-08-13 ENCOUNTER — Ambulatory Visit (INDEPENDENT_AMBULATORY_CARE_PROVIDER_SITE_OTHER): Payer: Medicare HMO | Admitting: Adult Health

## 2016-08-13 VITALS — BP 120/76 | Temp 96.9°F | Ht 59.0 in | Wt 233.0 lb

## 2016-08-13 DIAGNOSIS — R600 Localized edema: Secondary | ICD-10-CM

## 2016-08-13 LAB — BASIC METABOLIC PANEL
BUN: 22 mg/dL (ref 6–23)
CO2: 36 meq/L — AB (ref 19–32)
Calcium: 9.4 mg/dL (ref 8.4–10.5)
Chloride: 104 mEq/L (ref 96–112)
Creatinine, Ser: 0.58 mg/dL (ref 0.40–1.20)
GFR: 106.12 mL/min (ref >60.00)
GLUCOSE: 124 mg/dL — AB (ref 70–99)
Potassium: 4.1 mEq/L (ref 3.5–5.1)
Sodium: 145 mEq/L (ref 135–145)

## 2016-08-13 MED ORDER — FUROSEMIDE 20 MG PO TABS: 20.0000 mg | ORAL_TABLET | Freq: Two times a day (BID) | ORAL | 3 refills | Status: DC

## 2016-08-13 NOTE — Progress Notes (Signed)
Subjective:    Patient ID: Becky Newton, female    DOB: [birth year], 82 y.o.   MRN: [id number]  HPI  83 year old female who  has a past medical history of Atrial fibrillation ([place]); COPD (chronic obstructive pulmonary disease) ([place]); Diabetes ([place]); Fibrocystic breast determined by biopsy (1977); H/O hernia repair (2006); H/O left breast biopsy (1982); Incisional hernia; Lung disease; and S/P scar revision.   She presents to the office today for the complaint of lower extremity swelling. She reports that she was seen by her PCP back in June at which point she was asked to stop taking lasix? Since that time she has had progressively worsening lower extremity edema. She restarted taking Lasix about one week ago, but has not noticed any difference in the amount of edema located.    Review of Systems See HPI   Past Medical History:  Diagnosis Date  . Atrial fibrillation ([place])   . COPD (chronic obstructive pulmonary disease) ([place])   . Diabetes ([place])   . Fibrocystic breast determined by biopsy 1977  . H/O hernia repair 2006  . H/O left breast biopsy 1982  . Incisional hernia   . Lung disease    Paralyzed left hemidiaphragm  . S/P scar revision     Social History   Social History  . Marital status: Married    Spouse name: N/A  . Number of children: N/A  . Years of education: N/A   Occupational History  . Not on file.   Social History Main Topics  . Smoking status: Former Smoker    Packs/day: 0.50    Years: 20.00    Types: Cigarettes    Quit date: 08/23/1989  . Smokeless tobacco: Never Used  . Alcohol use No  . Drug use: No  . Sexual activity: No   Other Topics Concern  . Not on file   Social History Narrative  . No narrative on file    Past Surgical History:  Procedure Laterality Date  . APPENDECTOMY  1966  . CATARACT EXTRACTION  2004,2005  . HERNIA REPAIR  2006  . HIATAL HERNIA REPAIR  1966  . NISSEN FUNDOPLICATION    . TOTAL ABDOMINAL HYSTERECTOMY W/  BILATERAL SALPINGOOPHORECTOMY  1974   hx of cancer     Family History  Problem Relation Age of Onset  . Breast cancer Mother   . Diabetes Mother     Allergies  Allergen Reactions  . Iodine Nausea And Vomiting  . Ivp Dye [Iodinated Diagnostic Agents] Nausea And Vomiting    Current Outpatient Prescriptions on File Prior to Visit  Medication Sig Dispense Refill  . AMBULATORY NON FORMULARY MEDICATION Accu-Chek FastClix Lancets Drum. Use to check blood sugar twice a day. Type 2 diabetes. 5 Units 11  . AMBULATORY NON FORMULARY MEDICATION Accu-Chek SmartView glucose testing strips. Use to check blood sugar twice a day.  Type 2 diabetes. 100 Units 11  . benzonatate (TESSALON) 200 MG capsule Take 1 capsule (200 mg total) by mouth 3 (three) times daily as needed for cough. 20 capsule 2  . budesonide-formoterol (SYMBICORT) 160-4.5 MCG/ACT inhaler Inhale 2 puffs into the lungs 2 (two) times daily. 1 Inhaler 3  . diltiazem (CARDIZEM CD) 180 MG 24 hr capsule TAKE 1 CAPSULE (180 MG TOTAL) BY MOUTH DAILY. 30 capsule 4  . HYDROcodone-homatropine (HYCODAN) 5-1.5 MG/5ML syrup Take 5 mLs by mouth at bedtime as needed. 120 mL 0  . ipratropium-albuterol (DUONEB) 0.5-2.5 (3) MG/3ML SOLN Take 3 mLs by  nebulization every 4 (four) hours as needed. 360 mL 3  . Lutein ([employer] LUTEIN) 40 MG CAPS Take 1 capsule by mouth daily.    . metoprolol succinate (TOPROL-XL) 25 MG 24 hr tablet TAKE 1 TABLET (25 MG TOTAL) BY MOUTH DAILY. (Patient taking differently: Take 12.5 mg by mouth daily. ) 30 tablet 5  . Multiple Vitamins-Minerals (CENTRUM SILVER ULTRA WOMENS PO) Take by mouth.    . Multiple Vitamins-Minerals (PRESERVISION AREDS PO) Take 2 capsules by mouth 2 (two) times daily.    . Omega-3 Fatty Acids (FISH OIL) 1000 MG CAPS Take 1,000 mg by mouth daily.    Marland Kitchen omeprazole (PRILOSEC) 20 MG capsule Take 1 capsule (20 mg total) by mouth 2 (two) times daily before a meal. 180 capsule 3  . ondansetron (ZOFRAN) 4 MG tablet Take 1  tablet (4 mg total) by mouth every 6 (six) hours. 12 tablet 0  . OXYGEN Inhale 2 L into the lungs.    Vladimir Faster Glycol-Propyl Glycol (SYSTANE ULTRA OP) Apply to eye.    . rivaroxaban (XARELTO) 20 MG TABS tablet Take 1 tablet (20 mg total) by mouth daily with supper. 90 tablet 1  . tolterodine (DETROL LA) 4 MG 24 hr capsule Take 4 mg by mouth daily.    . Travoprost, BAK Free, (TRAVATAN Z) 0.004 % SOLN ophthalmic solution 1 drop at bedtime.     No current facility-administered medications on file prior to visit.     BP 120/76 (BP Location: Right Wrist)   Temp (!) 96.9 F (36.1 C) (Oral)   Ht _0  (1.499 m)   Wt 233 lb (105.7 kg)   BMI 47.06 kg/m       Objective:   Physical Exam  Constitutional: She is oriented to person, place, and time. She appears well-developed and well-nourished. No distress.  Cardiovascular: Normal rate, regular rhythm, normal heart sounds and intact distal pulses.  Exam reveals no gallop and no friction rub.   No murmur heard. Pulmonary/Chest:  2 L [place]   Musculoskeletal: She exhibits edema and tenderness.  Using motorized wheelchair  + 1 pitting edema to bilateral lower extremities.Tenderness around bilateral ankles   Neurological: She is alert and oriented to person, place, and time.  Skin: Skin is warm and dry. No rash noted. She is not diaphoretic. No erythema. No pallor.  Psychiatric: She has a normal mood and affect. Her behavior is normal. Judgment and thought content normal.  Nursing note and vitals reviewed.     Assessment & Plan:  1. Lower extremity edema - Basic Metabolic Panel - furosemide (LASIX) 20 MG tablet; Take 1 tablet (20 mg total) by mouth 2 (two) times daily.  Dispense: 60 tablet; Refill: 3 - She is going to follow up in one week  - Consider adding potassium supplement   [name], NP

## 2016-08-14 ENCOUNTER — Encounter: Payer: Self-pay | Admitting: Adult Health

## 2016-08-14 ENCOUNTER — Other Ambulatory Visit: Payer: Self-pay | Admitting: Adult Health

## 2016-08-14 MED ORDER — POTASSIUM CHLORIDE ER 10 MEQ PO TBCR: 10.0000 meq | EXTENDED_RELEASE_TABLET | Freq: Every day | ORAL | 1 refills | Status: DC

## 2016-08-15 ENCOUNTER — Encounter: Payer: Self-pay | Admitting: Adult Health

## 2016-08-22 ENCOUNTER — Encounter: Payer: Self-pay | Admitting: Adult Health

## 2016-08-22 ENCOUNTER — Ambulatory Visit (INDEPENDENT_AMBULATORY_CARE_PROVIDER_SITE_OTHER): Payer: Medicare HMO | Admitting: Adult Health

## 2016-08-22 VITALS — BP 134/66 | Temp 97.8°F | Wt 232.0 lb

## 2016-08-22 DIAGNOSIS — J449 Chronic obstructive pulmonary disease, unspecified: Secondary | ICD-10-CM

## 2016-08-22 DIAGNOSIS — R600 Localized edema: Secondary | ICD-10-CM | POA: Diagnosis not present

## 2016-08-22 DIAGNOSIS — J441 Chronic obstructive pulmonary disease with (acute) exacerbation: Secondary | ICD-10-CM | POA: Insufficient documentation

## 2016-08-22 DIAGNOSIS — I480 Paroxysmal atrial fibrillation: Secondary | ICD-10-CM | POA: Diagnosis not present

## 2016-08-22 DIAGNOSIS — E119 Type 2 diabetes mellitus without complications: Secondary | ICD-10-CM

## 2016-08-22 LAB — POCT GLYCOSYLATED HEMOGLOBIN (HGB A1C): Hemoglobin A1C: 6.6

## 2016-08-22 MED ORDER — METFORMIN HCL 500 MG PO TABS: 500.0000 mg | ORAL_TABLET | Freq: Two times a day (BID) | ORAL | 3 refills | Status: DC

## 2016-08-22 MED ORDER — METFORMIN HCL 500 MG PO TABS: 500.0000 mg | ORAL_TABLET | Freq: Two times a day (BID) | ORAL | 0 refills | Status: DC

## 2016-08-22 MED ORDER — HYDROCHLOROTHIAZIDE 12.5 MG PO CAPS: 12.5000 mg | ORAL_CAPSULE | Freq: Every day | ORAL | 3 refills | Status: DC

## 2016-08-22 NOTE — Progress Notes (Signed)
Patient presents to clinic today to establish care. She is a pleasant 83 year old female who  has a past medical history of Atrial fibrillation ([place]); COPD (chronic obstructive pulmonary disease) ([place]); Diabetes ([place]); Fibrocystic breast determined by biopsy (1977); H/O hernia repair (2006); H/O left breast biopsy (1982); Incisional hernia; Lung disease; and S/P scar revision.  Unsure of when she had her last physical   Acute Concerns: Establish Care   Lower extremity edema- during her last visit a week ago she was complaining of lower extremity edema, she had restarted lasix about a week prior to seeing me and did not notice and significant difference. Lasix was increased to 40 mg daily. Unfortunately, when the extra lasix was added her blood pressure dropped into the 49'S systolic and she did not notice a difference in her edema. She has ordered compression socks to see if that helps   Chronic Issues: Diabetes - Has taken metformin in the past. She reports that her blood sugars have been running in the 140's at home without metformin. Her last A1c was 5.3 in December 2017.   A fib - She is controlled with Xerelto and Cardizem 180 mg and Metoprolol 25 mg - she is rate controlled.   COPD - Oxygen 3 L via [place], Symbicort, Duoneb PRN, and Ventolin inhaler - she feels well controlled.   GERD - Takes prilosec and feels as though she is controlled.   Health Maintenance: Dental -- Does routine care  Vision -- Routine Care  Immunizations --UTD Colonoscopy -- No longer needed Mammogram -- No longer needed PAP --  No longer needed Bone Density --  Never had   Is followed by   Dr. Lajoyce Corners - glaucoma  Cardiology in [place] fib    Past Medical History:  Diagnosis Date  . Atrial fibrillation ([place])   . COPD (chronic obstructive pulmonary disease) ([place])   . Diabetes ([place])   . Fibrocystic breast determined by biopsy 1977  . H/O hernia repair 2006  . H/O left breast biopsy 1982  .  Incisional hernia   . Lung disease    Paralyzed left hemidiaphragm  . S/P scar revision     Past Surgical History:  Procedure Laterality Date  . APPENDECTOMY  1966  . CATARACT EXTRACTION  2004,2005  . CHOLECYSTECTOMY  1975  . HERNIA REPAIR  2006  . HIATAL HERNIA REPAIR  1966  . NISSEN FUNDOPLICATION    . TOTAL ABDOMINAL HYSTERECTOMY W/ BILATERAL SALPINGOOPHORECTOMY  1974   hx of cancer     Current Outpatient Prescriptions on File Prior to Visit  Medication Sig Dispense Refill  . AMBULATORY NON FORMULARY MEDICATION Accu-Chek FastClix Lancets Drum. Use to check blood sugar twice a day. Type 2 diabetes. 5 Units 11  . AMBULATORY NON FORMULARY MEDICATION Accu-Chek SmartView glucose testing strips. Use to check blood sugar twice a day.  Type 2 diabetes. 100 Units 11  . benzonatate (TESSALON) 200 MG capsule Take 1 capsule (200 mg total) by mouth 3 (three) times daily as needed for cough. 20 capsule 2  . budesonide-formoterol (SYMBICORT) 160-4.5 MCG/ACT inhaler Inhale 2 puffs into the lungs 2 (two) times daily. 1 Inhaler 3  . diltiazem (CARDIZEM CD) 180 MG 24 hr capsule TAKE 1 CAPSULE (180 MG TOTAL) BY MOUTH DAILY. 30 capsule 4  . furosemide (LASIX) 20 MG tablet Take 1 tablet (20 mg total) by mouth 2 (two) times daily. 60 tablet 3  . HYDROcodone-homatropine (HYCODAN) 5-1.5 MG/5ML syrup Take  5 mLs by mouth at bedtime as needed. 120 mL 0  . ipratropium-albuterol (DUONEB) 0.5-2.5 (3) MG/3ML SOLN Take 3 mLs by nebulization every 4 (four) hours as needed. 360 mL 3  . Lutein (CVS LUTEIN) 40 MG CAPS Take 1 capsule by mouth daily.    . metoprolol succinate (TOPROL-XL) 25 MG 24 hr tablet TAKE 1 TABLET (25 MG TOTAL) BY MOUTH DAILY. (Patient taking differently: Take 12.5 mg by mouth daily. ) 30 tablet 5  . Multiple Vitamins-Minerals (CENTRUM SILVER ULTRA WOMENS PO) Take by mouth.    . Multiple Vitamins-Minerals (PRESERVISION AREDS PO) Take 2 capsules by mouth 2 (two) times daily.    . Omega-3 Fatty  Acids (FISH OIL) 1000 MG CAPS Take 1,000 mg by mouth daily.    Marland Kitchen omeprazole (PRILOSEC) 20 MG capsule Take 1 capsule (20 mg total) by mouth 2 (two) times daily before a meal. 180 capsule 3  . ondansetron (ZOFRAN) 4 MG tablet Take 1 tablet (4 mg total) by mouth every 6 (six) hours. 12 tablet 0  . OXYGEN Inhale 2 L into the lungs.    Vladimir Faster Glycol-Propyl Glycol (SYSTANE ULTRA OP) Apply to eye.    . potassium chloride (K-DUR) 10 MEQ tablet Take 1 tablet (10 mEq total) by mouth daily. 30 tablet 1  . rivaroxaban (XARELTO) 20 MG TABS tablet Take 1 tablet (20 mg total) by mouth daily with supper. 90 tablet 1  . tolterodine (DETROL LA) 4 MG 24 hr capsule Take 4 mg by mouth daily.    . Travoprost, BAK Free, (TRAVATAN Z) 0.004 % SOLN ophthalmic solution 1 drop at bedtime.    . triamcinolone cream (KENALOG) 0.1 % Apply 1 application topically 2 (two) times daily.    . VENTOLIN HFA 108 (90 Base) MCG/ACT inhaler INHALE TWO PUFFS EVERY 4-6 HOURS ONLY AS NEEDED FOR SHORTNESS OF BREATH OR WHEEZING.  4   No current facility-administered medications on file prior to visit.     Allergies  Allergen Reactions  . Iodine Nausea And Vomiting  . Ivp Dye [Iodinated Diagnostic Agents] Nausea And Vomiting    Family History  Problem Relation Age of Onset  . Breast cancer Mother   . Diabetes Mother   . Arthritis Mother   . Stroke Mother   . Heart attack Mother     Social History   Social History  . Marital status: Married    Spouse name: N/A  . Number of children: N/A  . Years of education: N/A   Occupational History  . Not on file.   Social History Main Topics  . Smoking status: Former Smoker    Packs/day: 0.50    Years: 20.00    Types: Cigarettes    Quit date: 08/23/1989  . Smokeless tobacco: Never Used  . Alcohol use No  . Drug use: No  . Sexual activity: No   Other Topics Concern  . Not on file   Social History Narrative  . No narrative on file    Review of Systems    Constitutional: Negative.   HENT: Negative.   Eyes: Negative.   Respiratory: Positive for shortness of breath.   Cardiovascular: Positive for leg swelling.  Gastrointestinal: Positive for heartburn.  Genitourinary: Negative.   Musculoskeletal: Negative.   Skin: Negative.   Neurological: Negative.     BP 134/66 (BP Location: Right Wrist)   Temp 97.8 F (36.6 C) (Oral)   Wt 232 lb (105.2 kg)   BMI 46.86 kg/m   Physical  Exam  Constitutional: She is oriented to person, place, and time and well-developed, well-nourished, and in no distress. No distress.  Cardiovascular: Normal rate, regular rhythm, normal heart sounds and intact distal pulses.  Exam reveals no gallop and no friction rub.   No murmur heard. Pulmonary/Chest: Effort normal and breath sounds normal. No respiratory distress. She has no wheezes. She has no rales. She exhibits no tenderness.  3 L via [place]   Abdominal: Soft. Bowel sounds are normal. She exhibits no distension and no mass. There is no tenderness. There is no rebound and no guarding.  Musculoskeletal: She exhibits edema (bilateral lower extremity edema ) and tenderness.  Uses a motorized wheel chair   Neurological: She is alert and oriented to person, place, and time. Gait normal. GCS score is 15.  Skin: Skin is warm and dry. No rash noted. She is not diaphoretic. No erythema. No pallor.  Psychiatric: Mood, memory, affect and judgment normal.  Nursing note and vitals reviewed.   Recent Results (from the past 2160 hour(s))  POCT INR     Status: None   Collection Time: 05/31/16  2:48 PM  Result Value Ref Range   INR 3.4   POCT INR     Status: None   Collection Time: 06/04/16  4:39 PM  Result Value Ref Range   INR 1.6   Basic Metabolic Panel     Status: Abnormal   Collection Time: 08/13/16 11:09 AM  Result Value Ref Range   Sodium 145 135 - 145 mEq/L   Potassium 4.1 3.5 - 5.1 mEq/L   Chloride 104 96 - 112 mEq/L   CO2 36 (H) 19 - 32 mEq/L   Glucose,  Bld 124 (H) 70 - 99 mg/dL   BUN 22 6 - 23 mg/dL   Creatinine, Ser 0.58 0.40 - 1.20 mg/dL   Calcium 9.4 8.4 - 10.5 mg/dL   GFR 106.12 >60.00 mL/min    Assessment/Plan:  1. Type 2 diabetes mellitus without complication, without long-term current use of insulin (HCC)  - POC HgB A1c- 6.6  - metFORMIN (GLUCOPHAGE) 500 MG tablet; Take 1 tablet (500 mg total) by mouth 2 (two) times daily with a meal.  Dispense: 180 tablet; Refill: 3 - Will follow up with at her yearly exam   2. Paroxysmal atrial fibrillation (HCC) - Rate controlled.  - Continue with cardiology plan of care  3. Lower extremity edema - She is on a beta blocker and CCB, which can cause swelling in lower extremities. She is rate controlled on these medications so I would not like to d/c them at this point  - Will trial HCTZ. She will monitor at home and follow up with me in one week. Continue with Potassium supplement  - hydrochlorothiazide (MICROZIDE) 12.5 MG capsule; Take 1 capsule (12.5 mg total) by mouth daily.  Dispense: 30 capsule; Refill: 3  4. Chronic obstructive pulmonary disease, unspecified COPD type ([place]) - Maintained with current therapy   [name], NP

## 2016-08-22 NOTE — Patient Instructions (Signed)
I have sent in a prescription for HCTZ 12.5 mg to see if we can get some of this fluid off your legs. Please let me know how you are doing in a week. Restart Potassium   Metformin has been called in   Follow up with me for your physical sometime

## 2016-08-23 ENCOUNTER — Ambulatory Visit: Payer: Medicare HMO | Admitting: Adult Health

## 2016-08-25 ENCOUNTER — Other Ambulatory Visit: Payer: Self-pay | Admitting: Cardiology

## 2016-08-27 NOTE — Telephone Encounter (Signed)
REFILL 

## 2016-08-29 ENCOUNTER — Encounter: Payer: Self-pay | Admitting: Adult Health

## 2016-08-29 DIAGNOSIS — R600 Localized edema: Secondary | ICD-10-CM

## 2016-08-30 ENCOUNTER — Encounter: Payer: Self-pay | Admitting: Adult Health

## 2016-08-30 ENCOUNTER — Telehealth: Payer: Self-pay | Admitting: Adult Health

## 2016-08-30 NOTE — Telephone Encounter (Signed)
Patient can't get in until Oct [facility]st [facility]  Patient states she is in pain--wants to know if they can get in with a stat referral.  Patient is requesting a call back with an answer after you talk to [facility].

## 2016-08-31 ENCOUNTER — Other Ambulatory Visit: Payer: Self-pay | Admitting: Adult Health

## 2016-08-31 ENCOUNTER — Encounter: Payer: Self-pay | Admitting: Adult Health

## 2016-08-31 MED ORDER — OXYCODONE-ACETAMINOPHEN 5-325 MG PO TABS: 1.0000 | ORAL_TABLET | Freq: Four times a day (QID) | ORAL | 0 refills | Status: AC | PRN

## 2016-08-31 NOTE — Telephone Encounter (Signed)
Spoke with [facility] and they state that pt was scheduled for earlier appt but she canceled it because she would be out of town. The next available is the October appt that is currently scheduled. They will place her on their waitlist/cancelation list in case anything open sooner.   [place].

## 2016-08-31 NOTE — Telephone Encounter (Signed)
LMVM for referral coordinator to call back to see if we can schedule pt sooner.

## 2016-09-03 DIAGNOSIS — J449 Chronic obstructive pulmonary disease, unspecified: Secondary | ICD-10-CM | POA: Diagnosis not present

## 2016-09-04 ENCOUNTER — Other Ambulatory Visit: Payer: Self-pay

## 2016-09-04 DIAGNOSIS — M7989 Other specified soft tissue disorders: Secondary | ICD-10-CM

## 2016-09-10 ENCOUNTER — Encounter: Payer: Self-pay | Admitting: Adult Health

## 2016-09-12 ENCOUNTER — Encounter: Payer: Self-pay | Admitting: Physician Assistant

## 2016-09-13 ENCOUNTER — Encounter: Payer: Self-pay | Admitting: Adult Health

## 2016-09-14 ENCOUNTER — Encounter: Payer: Self-pay | Admitting: Vascular Surgery

## 2016-09-17 ENCOUNTER — Encounter: Payer: Self-pay | Admitting: Adult Health

## 2016-09-18 ENCOUNTER — Encounter: Payer: Self-pay | Admitting: Adult Health

## 2016-09-24 ENCOUNTER — Encounter: Payer: Self-pay | Admitting: Adult Health

## 2016-09-26 MED ORDER — DILTIAZEM HCL ER COATED BEADS 180 MG PO CP24: 180.0000 mg | ORAL_CAPSULE | Freq: Every day | ORAL | 3 refills | Status: DC

## 2016-10-04 DIAGNOSIS — J449 Chronic obstructive pulmonary disease, unspecified: Secondary | ICD-10-CM | POA: Diagnosis not present

## 2016-10-05 ENCOUNTER — Encounter: Payer: Self-pay | Admitting: Adult Health

## 2016-10-07 ENCOUNTER — Other Ambulatory Visit: Payer: Self-pay | Admitting: Adult Health

## 2016-10-08 NOTE — Telephone Encounter (Signed)
Sent to the pharmacy by e-scribe. 

## 2016-10-08 NOTE — Telephone Encounter (Signed)
It is ok to refill while she is taking diuretics. 90 + 1

## 2016-10-09 ENCOUNTER — Ambulatory Visit (HOSPITAL_COMMUNITY)
Admission: RE | Admit: 2016-10-09 | Discharge: 2016-10-09 | Disposition: A | Discharge status: Home / Self Care | Payer: Medicare HMO | Source: Ambulatory Visit | Attending: Vascular Surgery | Admitting: Vascular Surgery

## 2016-10-09 DIAGNOSIS — M7989 Other specified soft tissue disorders: Secondary | ICD-10-CM | POA: Diagnosis not present

## 2016-10-09 DIAGNOSIS — I8391 Asymptomatic varicose veins of right lower extremity: Secondary | ICD-10-CM | POA: Diagnosis not present

## 2016-10-10 ENCOUNTER — Ambulatory Visit (INDEPENDENT_AMBULATORY_CARE_PROVIDER_SITE_OTHER): Payer: Medicare HMO | Admitting: Vascular Surgery

## 2016-10-10 ENCOUNTER — Encounter: Payer: Self-pay | Admitting: Vascular Surgery

## 2016-10-10 ENCOUNTER — Telehealth: Payer: Self-pay | Admitting: Adult Health

## 2016-10-10 VITALS — BP 133/70 | HR 70 | Temp 97.3°F | Resp 20 | Ht 61.0 in | Wt 219.0 lb

## 2016-10-10 DIAGNOSIS — M7989 Other specified soft tissue disorders: Secondary | ICD-10-CM

## 2016-10-10 NOTE — Progress Notes (Signed)
[facility]  Patient name: Becky Newton MRN: [id number] DOB: [birth year] Sex: female  REASON FOR CONSULT: Bilateral lower should be swelling left greater than right  HPI: Becky Newton is a 83 y.o. female, who is here today for evaluation of lower extremity swelling. She has severe pulmonary dysfunction and is in a motorized wheelchair secondary to this. She sits throughout the day with her legs dependent. She does not walk. She has significant swelling in her left and right leg but this is more so in her left leg. No history of DVT. She reports that her swelling today is much less than typical. She has minimal swelling in her right leg today and mild to moderate swelling in her left leg. She has no history of venous stasis disease her venous ulcers. Has no history of arterial insufficiency.   Past Medical History:  Diagnosis Date  . A-fib ([place])   . Atrial fibrillation ([place])   . Chicken pox   . COPD (chronic obstructive pulmonary disease) ([place])   . Diabetes ([place])   . Fibrocystic breast determined by biopsy 1977  . GERD (gastroesophageal reflux disease)   . Glaucoma   . H/O hernia repair 2006  . H/O left breast biopsy 1982  . Incisional hernia   . Lung disease    Paralyzed left hemidiaphragm  . S/P scar revision   . Uterine cancer ([place])     Family History  Problem Relation Age of Onset  . Breast cancer Mother   . Arthritis Mother   . Stroke Mother   . Heart attack Mother   . Heart disease Mother   . Alcohol abuse Brother     SOCIAL HISTORY: Social History   Social History  . Marital status: Married    Spouse name: N/A  . Number of children: N/A  . Years of education: N/A   Occupational History  . Not on file.   Social History Main Topics  . Smoking status: Former Smoker    Packs/day: 0.50    Years: 20.00    Types: Cigarettes    Quit date: 08/23/1989  . Smokeless tobacco: Never Used  .  Alcohol use No  . Drug use: No  . Sexual activity: No   Other Topics Concern  . Not on file   Social History Narrative  . No narrative on file    Allergies  Allergen Reactions  . Iodine Nausea And Vomiting  . Ivp Dye [Iodinated Diagnostic Agents] Nausea And Vomiting    Current Outpatient Prescriptions  Medication Sig Dispense Refill  . AMBULATORY NON FORMULARY MEDICATION Accu-Chek FastClix Lancets Drum. Use to check blood sugar twice a day. Type 2 diabetes. 5 Units 11  . AMBULATORY NON FORMULARY MEDICATION Accu-Chek SmartView glucose testing strips. Use to check blood sugar twice a day.  Type 2 diabetes. 100 Units 11  . benzonatate (TESSALON) 200 MG capsule Take 1 capsule (200 mg total) by mouth 3 (three) times daily as needed for cough. 20 capsule 2  . budesonide-formoterol (SYMBICORT) 160-4.5 MCG/ACT inhaler Inhale 2 puffs into the lungs 2 (two) times daily. 1 Inhaler 3  . diltiazem (CARDIZEM CD) 180 MG 24 hr capsule Take 1 capsule (180 mg total) by mouth daily. 90 capsule 3  . furosemide (LASIX) 20 MG tablet Take 1 tablet (20 mg total) by mouth 2 (two) times daily. 60 tablet 3  . hydrochlorothiazide (MICROZIDE) 12.5 MG capsule Take 1 capsule (12.5 mg total) by mouth daily.  30 capsule 3  . HYDROcodone-homatropine (HYCODAN) 5-1.5 MG/5ML syrup Take 5 mLs by mouth at bedtime as needed. 120 mL 0  . ipratropium-albuterol (DUONEB) 0.5-2.5 (3) MG/3ML SOLN Take 3 mLs by nebulization every 4 (four) hours as needed. 360 mL 3  . KLOR-CON 10 10 MEQ tablet TAKE 1 TABLET BY MOUTH EVERY DAY 90 tablet 1  . Lutein (CVS LUTEIN) 40 MG CAPS Take 1 capsule by mouth daily.    . metFORMIN (GLUCOPHAGE) 500 MG tablet Take 1 tablet (500 mg total) by mouth 2 (two) times daily with a meal. 180 tablet 3  . metoprolol succinate (TOPROL-XL) 25 MG 24 hr tablet Take 1 tablet (25 mg total) by mouth daily. NEED OV. 15 tablet 0  . Multiple Vitamins-Minerals (CENTRUM SILVER ULTRA WOMENS PO) Take by mouth.    .  Multiple Vitamins-Minerals (PRESERVISION AREDS PO) Take 2 capsules by mouth 2 (two) times daily.    Marland Kitchen omeprazole (PRILOSEC) 20 MG capsule Take 1 capsule (20 mg total) by mouth 2 (two) times daily before a meal. 180 capsule 3  . ondansetron (ZOFRAN) 4 MG tablet Take 1 tablet (4 mg total) by mouth every 6 (six) hours. 12 tablet 0  . OXYGEN Inhale 2 L into the lungs.    Vladimir Faster Glycol-Propyl Glycol (SYSTANE ULTRA OP) Apply to eye.    . rivaroxaban (XARELTO) 20 MG TABS tablet Take 1 tablet (20 mg total) by mouth daily with supper. 90 tablet 1  . tolterodine (DETROL LA) 4 MG 24 hr capsule Take 4 mg by mouth daily.    . Travoprost, BAK Free, (TRAVATAN Z) 0.004 % SOLN ophthalmic solution 1 drop at bedtime.    . VENTOLIN HFA 108 (90 Base) MCG/ACT inhaler INHALE TWO PUFFS EVERY 4-6 HOURS ONLY AS NEEDED FOR SHORTNESS OF BREATH OR WHEEZING.  4  . Omega-3 Fatty Acids (FISH OIL) 1000 MG CAPS Take 1,000 mg by mouth daily.    Marland Kitchen triamcinolone cream (KENALOG) 0.1 % Apply 1 application topically 2 (two) times daily.     No current facility-administered medications for this visit.     REVIEW OF SYSTEMS:  _0  denotes positive finding, _1  denotes negative finding Cardiac  Comments:  Chest pain or chest pressure:    Shortness of breath upon exertion: x   Short of breath when lying flat:    Irregular heart rhythm: x       Vascular    Pain in calf, thigh, or hip brought on by ambulation: x   Pain in feet at night that wakes you up from your sleep:     Blood clot in your veins:    Leg swelling:  x       Pulmonary    Oxygen at home: x   Productive cough:  x   Wheezing:  x       Neurologic    Sudden weakness in arms or legs:     Sudden numbness in arms or legs:     Sudden onset of difficulty speaking or slurred speech:    Temporary loss of vision in one eye:     Problems with dizziness:         Gastrointestinal    Blood in stool:     Vomited blood:         Genitourinary    Burning when  urinating:     Blood in urine:        Psychiatric    Major depression:  Hematologic    Bleeding problems:    Problems with blood clotting too easily:        Skin    Rashes or ulcers:        Constitutional    Fever or chills:      PHYSICAL EXAM: Vitals:   10/10/16 1102  BP: 133/70  Pulse: 70  Resp: 20  Temp: (!) 97.3 F (36.3 C)  TempSrc: Oral  SpO2: 96%  Weight: 219 lb (99.3 kg)  Height: _0  (1.549 m)    GENERAL: The patient is a well-nourished female, in no acute distress. The vital signs are documented above. CARDIOVASCULAR: Carotid arteries without bruits bilaterally. 2+ radial and 2+ dorsalis pedis pulses bilaterally. PULMONARY: There is good air exchange  ABDOMEN: Soft and non-tender  MUSCULOSKELETAL: There are no major deformities or cyanosis. NEUROLOGIC: No focal weakness or paresthesias are detected. SKIN: No open ulcerations of her lower from these. No swelling in her right leg and mild pitting edema in her left leg. PSYCHIATRIC: The patient has a normal affect.  DATA:  Venous duplex today shows no evidence of DVT or chronic obstruction. Does have valvular incompetence in her right great saphenous vein. No significant reflux in the superficial or deep system on the left. There is a mild segment of reflux at the level of the knee on the left great saphenous vein  MEDICAL ISSUES: Discuss these findings at length with the patient and her husband present. I feel this is related to volume overloaded mainly and she is having response to diuretic treatment. Also explained conservative treatment. Due to her pulmonary compromise she would not be able to elevate her legs higher than her heart. She also was tried knee-high compression reports that it is "cutting off her circulation". I explained that this is not the case and that the compression is able to maintain her swelling. Have suggested 20-30 mmHg compression. They were reassured with this discussion and will  see me again on an as-needed basis   Rosetta Posner, MD [facility] Tel [phone] Pager [phone]

## 2016-10-10 NOTE — Telephone Encounter (Signed)
° ° °  Pt call to say the below med was sent to the wrong pharmacy and asked if it can be sent to the below pharmacy  Pt also ask for a call back said she has questions about her medicine     Pt request refill of the following:  KLOR-CON 10 10 MEQ tablet   [facility]acy: [facility]

## 2016-10-11 MED ORDER — POTASSIUM CHLORIDE ER 10 MEQ PO TBCR: 10.0000 meq | EXTENDED_RELEASE_TABLET | Freq: Every day | ORAL | 1 refills | Status: DC

## 2016-10-11 NOTE — Telephone Encounter (Signed)
Spoke to the pt and informed her that medication has been sent to the pharmacy.

## 2016-10-11 NOTE — Telephone Encounter (Signed)
Please call pt back at [phone]

## 2016-10-12 ENCOUNTER — Encounter: Payer: Self-pay | Admitting: Adult Health

## 2016-10-18 ENCOUNTER — Encounter (HOSPITAL_COMMUNITY): Payer: Medicare HMO

## 2016-10-18 ENCOUNTER — Encounter: Payer: Medicare HMO | Admitting: Vascular Surgery

## 2016-10-19 ENCOUNTER — Encounter: Payer: Self-pay | Admitting: Adult Health

## 2016-10-19 DIAGNOSIS — R600 Localized edema: Secondary | ICD-10-CM

## 2016-10-19 MED ORDER — HYDROCHLOROTHIAZIDE 12.5 MG PO CAPS: 12.5000 mg | ORAL_CAPSULE | Freq: Every day | ORAL | 0 refills | Status: DC

## 2016-10-22 ENCOUNTER — Encounter (HOSPITAL_COMMUNITY): Payer: Medicare HMO

## 2016-10-22 ENCOUNTER — Encounter: Payer: Medicare HMO | Admitting: Surgery

## 2016-10-29 DIAGNOSIS — H401133 Primary open-angle glaucoma, bilateral, severe stage: Secondary | ICD-10-CM | POA: Diagnosis not present

## 2016-10-29 DIAGNOSIS — H47011 Ischemic optic neuropathy, right eye: Secondary | ICD-10-CM | POA: Diagnosis not present

## 2016-10-29 DIAGNOSIS — H5213 Myopia, bilateral: Secondary | ICD-10-CM | POA: Diagnosis not present

## 2016-10-29 DIAGNOSIS — Z961 Presence of intraocular lens: Secondary | ICD-10-CM | POA: Diagnosis not present

## 2016-10-29 DIAGNOSIS — H524 Presbyopia: Secondary | ICD-10-CM | POA: Diagnosis not present

## 2016-10-29 DIAGNOSIS — H43813 Vitreous degeneration, bilateral: Secondary | ICD-10-CM | POA: Diagnosis not present

## 2016-10-29 DIAGNOSIS — H52223 Regular astigmatism, bilateral: Secondary | ICD-10-CM | POA: Diagnosis not present

## 2016-10-31 ENCOUNTER — Encounter: Payer: Self-pay | Admitting: Adult Health

## 2016-11-03 DIAGNOSIS — J449 Chronic obstructive pulmonary disease, unspecified: Secondary | ICD-10-CM | POA: Diagnosis not present

## 2016-11-05 ENCOUNTER — Encounter: Payer: Self-pay | Admitting: Adult Health

## 2016-11-14 ENCOUNTER — Other Ambulatory Visit: Payer: Self-pay | Admitting: Family Medicine

## 2016-11-15 ENCOUNTER — Encounter: Payer: Self-pay | Admitting: Adult Health

## 2016-11-15 MED ORDER — METOPROLOL SUCCINATE ER 25 MG PO TB24: 25.0000 mg | ORAL_TABLET | Freq: Every day | ORAL | 0 refills | Status: DC

## 2016-11-15 MED ORDER — METOPROLOL SUCCINATE ER 25 MG PO TB24: 25.0000 mg | ORAL_TABLET | Freq: Every day | ORAL | 3 refills | Status: DC

## 2016-11-15 NOTE — Telephone Encounter (Signed)
Spoke to [place].  90 days for 1 year.

## 2016-11-15 NOTE — Telephone Encounter (Signed)
Ok to refill. If she wants 90 days she can have them

## 2016-11-29 ENCOUNTER — Other Ambulatory Visit: Payer: Self-pay | Admitting: Adult Health

## 2016-11-29 DIAGNOSIS — R600 Localized edema: Secondary | ICD-10-CM

## 2016-11-30 NOTE — Telephone Encounter (Signed)
Sent to the pharmacy by e-scribe. 

## 2016-12-04 DIAGNOSIS — J449 Chronic obstructive pulmonary disease, unspecified: Secondary | ICD-10-CM | POA: Diagnosis not present

## 2016-12-19 ENCOUNTER — Telehealth: Payer: Self-pay | Admitting: Adult Health

## 2016-12-19 NOTE — Telephone Encounter (Signed)
[facility] form to be filled out for oxygen.  Fax to [facility] at [phone].  Placed in Dr's folder.

## 2016-12-20 NOTE — Telephone Encounter (Signed)
Becky Newton, should we do referral to pulmonary?

## 2016-12-21 ENCOUNTER — Encounter: Payer: Self-pay | Admitting: Adult Health

## 2016-12-21 ENCOUNTER — Other Ambulatory Visit: Payer: Self-pay | Admitting: Adult Health

## 2016-12-21 ENCOUNTER — Other Ambulatory Visit: Payer: Self-pay | Admitting: Family Medicine

## 2016-12-21 DIAGNOSIS — J449 Chronic obstructive pulmonary disease, unspecified: Secondary | ICD-10-CM

## 2016-12-21 DIAGNOSIS — J45909 Unspecified asthma, uncomplicated: Secondary | ICD-10-CM

## 2016-12-21 DIAGNOSIS — I2723 Pulmonary hypertension due to lung diseases and hypoxia: Secondary | ICD-10-CM

## 2016-12-21 DIAGNOSIS — J9610 Chronic respiratory failure, unspecified whether with hypoxia or hypercapnia: Secondary | ICD-10-CM

## 2016-12-21 DIAGNOSIS — Z9981 Dependence on supplemental oxygen: Secondary | ICD-10-CM

## 2016-12-21 DIAGNOSIS — G4733 Obstructive sleep apnea (adult) (pediatric): Secondary | ICD-10-CM

## 2016-12-21 MED ORDER — RIVAROXABAN 20 MG PO TABS: 20.0000 mg | ORAL_TABLET | Freq: Every day | ORAL | 3 refills | Status: DC

## 2016-12-21 NOTE — Telephone Encounter (Signed)
She should probably be seen by pulmonary. Can we call and see if she is ok with this?

## 2016-12-21 NOTE — Telephone Encounter (Signed)
Referral to pulmonary placed in Epic.  See mychart message.

## 2016-12-25 ENCOUNTER — Other Ambulatory Visit: Payer: Self-pay | Admitting: Family Medicine

## 2016-12-25 DIAGNOSIS — R600 Localized edema: Secondary | ICD-10-CM

## 2016-12-25 MED ORDER — FUROSEMIDE 20 MG PO TABS: 20.0000 mg | ORAL_TABLET | Freq: Two times a day (BID) | ORAL | 1 refills | Status: DC

## 2016-12-25 NOTE — Telephone Encounter (Signed)
Sent to the pharmacy by e-scribe. 

## 2016-12-27 ENCOUNTER — Ambulatory Visit (INDEPENDENT_AMBULATORY_CARE_PROVIDER_SITE_OTHER): Payer: Medicare HMO | Admitting: Pulmonary Disease

## 2016-12-27 ENCOUNTER — Encounter: Payer: Self-pay | Admitting: Pulmonary Disease

## 2016-12-27 VITALS — BP 133/52 | HR 76 | Ht 61.0 in | Wt 200.0 lb

## 2016-12-27 DIAGNOSIS — J9610 Chronic respiratory failure, unspecified whether with hypoxia or hypercapnia: Secondary | ICD-10-CM

## 2016-12-27 NOTE — Progress Notes (Signed)
Subjective:   PATIENT ID: Becky Newton GENDER: female DOB: 1933-03-29, MRN: [id number]  Synopsis: Referred in Dec 2018 for possible sleep apnea  HPI  Chief Complaint  Patient presents with  . Pulm Consult    Per patient, not here for OSA. She is currently on 2L O2 and needs to have a form signed for [employer] to continue to provide her with O2.     Becky Newton has a diagnosis of chronic respiratory failure with hypoxemia and is here to see me for the same.   > she was put on on the oxygen 5 years ago  She has a history of diaphragm paralysis > this started in [place] years ago > she was diagnosed in [place] > she was found to have a large elevation of her right hemidiaphragm > Dr. Arlyce Dice operated  She never had asthma.  She quit smoking 27 years,  Smoked < 1/2 pack per day.  She started smoking 34 year ago  She has had pneumonia > at least 6 years ago she had it, treated as an outpatient > she was hospitalized for pneumonia about 10 year ago   She was prescribed Symbicort at one point and this is expensive so she struggles with that.  She struggles with dyspnea > she can only walk a few feet before she feels exhausted and has to stop   Past Medical History:  Diagnosis Date  . A-fib ([place])   . Atrial fibrillation ([place])   . Chicken pox   . COPD (chronic obstructive pulmonary disease) ([place])   . Diabetes ([place])   . Fibrocystic breast determined by biopsy 1977  . GERD (gastroesophageal reflux disease)   . Glaucoma   . H/O hernia repair 2006  . H/O left breast biopsy 1982  . Incisional hernia   . Lung disease    Paralyzed left hemidiaphragm  . S/P scar revision   . Uterine cancer ([place])      Family History  Problem Relation Age of Onset  . Breast cancer Mother   . Arthritis Mother   . Stroke Mother   . Heart attack Mother   . Heart disease Mother   . Alcohol abuse Brother      Social History   Socioeconomic History  . Marital status: Married   Spouse name: Not on file  . Number of children: Not on file  . Years of education: Not on file  . Highest education level: Not on file  Social Needs  . Financial resource strain: Not on file  . Food insecurity - worry: Not on file  . Food insecurity - inability: Not on file  . Transportation needs - medical: Not on file  . Transportation needs - non-medical: Not on file  Occupational History  . Not on file  Tobacco Use  . Smoking status: Former Smoker    Packs/day: 0.50    Years: 20.00    Pack years: 10.00    Types: Cigarettes    Last attempt to quit: 08/23/1989    Years since quitting: 27.3  . Smokeless tobacco: Never Used  Substance and Sexual Activity  . Alcohol use: No    Alcohol/week: 0.0 oz  . Drug use: No  . Sexual activity: No    Partners: Male  Other Topics Concern  . Not on file  Social History Narrative  . Not on file     Allergies  Allergen Reactions  . Iodine Nausea And Vomiting  . Ivp Dye [  Iodinated Diagnostic Agents] Nausea And Vomiting     Outpatient Medications Prior to Visit  Medication Sig Dispense Refill  . AMBULATORY NON FORMULARY MEDICATION Accu-Chek FastClix Lancets Drum. Use to check blood sugar twice a day. Type 2 diabetes. 5 Units 11  . AMBULATORY NON FORMULARY MEDICATION Accu-Chek SmartView glucose testing strips. Use to check blood sugar twice a day.  Type 2 diabetes. 100 Units 11  . benzonatate (TESSALON) 200 MG capsule Take 1 capsule (200 mg total) by mouth 3 (three) times daily as needed for cough. 20 capsule 2  . budesonide-formoterol (SYMBICORT) 160-4.5 MCG/ACT inhaler Inhale 2 puffs into the lungs 2 (two) times daily. 1 Inhaler 3  . diltiazem (CARDIZEM CD) 180 MG 24 hr capsule Take 1 capsule (180 mg total) by mouth daily. 90 capsule 3  . furosemide (LASIX) 20 MG tablet Take 1 tablet (20 mg total) by mouth 2 (two) times daily. 180 tablet 1  . hydrochlorothiazide (MICROZIDE) 12.5 MG capsule TAKE 1 CAPSULE EVERY DAY 90 capsule 1  .  ipratropium-albuterol (DUONEB) 0.5-2.5 (3) MG/3ML SOLN Take 3 mLs by nebulization every 4 (four) hours as needed. 360 mL 3  . Lutein ([employer]S LUTEIN) 40 MG CAPS Take 1 capsule by mouth daily.    . metoprolol succinate (TOPROL-XL) 25 MG 24 hr tablet Take 1 tablet (25 mg total) by mouth daily. 14 tablet 0  . Multiple Vitamins-Minerals (CENTRUM SILVER ULTRA WOMENS PO) Take by mouth.    . Multiple Vitamins-Minerals (PRESERVISION AREDS PO) Take 2 capsules by mouth 2 (two) times daily.    . Omega-3 Fatty Acids (FISH OIL) 1000 MG CAPS Take 1,000 mg by mouth daily.    Marland Kitchen omeprazole (PRILOSEC) 20 MG capsule Take 1 capsule (20 mg total) by mouth 2 (two) times daily before a meal. 180 capsule 3  . ondansetron (ZOFRAN) 4 MG tablet Take 1 tablet (4 mg total) by mouth every 6 (six) hours. 12 tablet 0  . OXYGEN Inhale 2 L into the lungs.    Vladimir Faster Glycol-Propyl Glycol (SYSTANE ULTRA OP) Apply to eye.    . potassium chloride (KLOR-CON 10) 10 MEQ tablet Take 1 tablet (10 mEq total) by mouth daily. 90 tablet 1  . rivaroxaban (XARELTO) 20 MG TABS tablet Take 1 tablet (20 mg total) by mouth daily with supper. 90 tablet 3  . tolterodine (DETROL LA) 4 MG 24 hr capsule Take 4 mg by mouth daily.    . Travoprost, BAK Free, (TRAVATAN Z) 0.004 % SOLN ophthalmic solution 1 drop at bedtime.    . triamcinolone cream (KENALOG) 0.1 % Apply 1 application topically 2 (two) times daily.    . VENTOLIN HFA 108 (90 Base) MCG/ACT inhaler INHALE TWO PUFFS EVERY 4-6 HOURS ONLY AS NEEDED FOR SHORTNESS OF BREATH OR WHEEZING.  4  . HYDROcodone-homatropine (HYCODAN) 5-1.5 MG/5ML syrup Take 5 mLs by mouth at bedtime as needed. 120 mL 0  . metFORMIN (GLUCOPHAGE) 500 MG tablet Take 1 tablet (500 mg total) by mouth 2 (two) times daily with a meal. 180 tablet 3   No facility-administered medications prior to visit.     Review of Systems  Constitutional: Positive for malaise/fatigue. Negative for chills, fever and weight loss.  HENT: Negative  for congestion, nosebleeds, sinus pain and sore throat.   Eyes: Negative for photophobia, pain and discharge.  Respiratory: Positive for cough and shortness of breath. Negative for hemoptysis, sputum production and wheezing.   Cardiovascular: Positive for orthopnea and leg swelling. Negative for chest pain  and palpitations.  Gastrointestinal: Negative for abdominal pain, constipation, diarrhea, nausea and vomiting.  Genitourinary: Negative for dysuria, frequency, hematuria and urgency.  Musculoskeletal: Negative for back pain, joint pain, myalgias and neck pain.  Skin: Negative for itching and rash.  Neurological: Negative for tingling, tremors, sensory change, speech change, focal weakness, seizures, weakness and headaches.  Psychiatric/Behavioral: Negative for memory loss, substance abuse and suicidal ideas. The patient is not nervous/anxious.       Objective:  Physical Exam   Vitals:   12/27/16 1415  BP: (!) 133/52  Pulse: 76  SpO2: 90%  Weight: 200 lb (90.7 kg)  Height: _0  (1.549 m)    Gen: chronically ill appearing, in wheelchair HENT: NCAT, OP clear, neck supple without masses Eyes: PERRL, EOMi Lymph: no cervical lymphadenopathy PULM: Poor air movement, normal effort, clear to auscultation CV: RRR, no mgr, no JVD GI: BS+, soft, nontender, no hsm Derm: no rash or skin breakdown MSK: normal bulk and tone Neuro: A&Ox4, CN II-XII intact, strength 5/5 in all 4 extremities Psyche: normal mood and affect   CBC    Component Value Date/Time   WBC 9.4 03/19/2016 1332   RBC 4.08 03/19/2016 1332   HGB 11.6 (L) 03/19/2016 1332   HCT 37.5 03/19/2016 1332   PLT 220 03/19/2016 1332   MCV 91.9 03/19/2016 1332   MCH 28.4 03/19/2016 1332   MCHC 30.9 (L) 03/19/2016 1332   RDW 15.6 (H) 03/19/2016 1332   LYMPHSABS 2,256 03/19/2016 1332   MONOABS 846 03/19/2016 1332   EOSABS 94 03/19/2016 1332   BASOSABS 0 03/19/2016 1332     Chest  imaging:  PFT:  Labs:  Path:  Echo:  Heart Catheterization:       Assessment & Plan:   Chronic respiratory failure, unspecified whether with hypoxia or hypercapnia ([place]) - Plan: Ambulatory Referral for DME  Discussion: Jini says she is here to see me today just to establish care long enough to get her oxygen renewed.  She complains of shortness of breath cough some wheezing and leg swelling.  I explained to her today that I am happy to help her with these conditions but it would involve some degree of workup and some commitment on her part to exercise and adherence to a regular medical regimen.  She was quite clear to me that she does not want to do any of that.  She just wants to get her oxygen renewed.  I did explain to her that she should at least try to take the Symbicort regularly to see if it helps her rather than on an as-needed basis.  She said that she would try this for a week and then let me know if it has made a difference or not.  She is not interested in having a repeat sleep study or further workup.  I had a pleasant time meeting her and her husband today, but I do not have the sense that they are interested in further follow-up.  Plan: Chronic respiratory failure with hypoxemia: Keep using 2 L of oxygen continuously  Shortness of breath: You may have asthma or COPD, but if you do I think they are mild Try taking Symbicort 2 puffs twice a day for the next week.  If it helps you then continue taking it if it does not then stop. If you decide you would like for me to try to help with shortness of breath more please let me know, we will consider a workup for your  heart and lungs at that time.  We will see you back as needed  > 30 minutes spent with her and her husband, 45 minute visit    Current Outpatient Medications:  .  AMBULATORY NON FORMULARY MEDICATION, Accu-Chek FastClix Lancets Drum. Use to check blood sugar twice a day. Type 2 diabetes., Disp: 5  Units, Rfl: 11 .  AMBULATORY NON FORMULARY MEDICATION, Accu-Chek SmartView glucose testing strips. Use to check blood sugar twice a day.  Type 2 diabetes., Disp: 100 Units, Rfl: 11 .  benzonatate (TESSALON) 200 MG capsule, Take 1 capsule (200 mg total) by mouth 3 (three) times daily as needed for cough., Disp: 20 capsule, Rfl: 2 .  budesonide-formoterol (SYMBICORT) 160-4.5 MCG/ACT inhaler, Inhale 2 puffs into the lungs 2 (two) times daily., Disp: 1 Inhaler, Rfl: 3 .  diltiazem (CARDIZEM CD) 180 MG 24 hr capsule, Take 1 capsule (180 mg total) by mouth daily., Disp: 90 capsule, Rfl: 3 .  furosemide (LASIX) 20 MG tablet, Take 1 tablet (20 mg total) by mouth 2 (two) times daily., Disp: 180 tablet, Rfl: 1 .  hydrochlorothiazide (MICROZIDE) 12.5 MG capsule, TAKE 1 CAPSULE EVERY DAY, Disp: 90 capsule, Rfl: 1 .  ipratropium-albuterol (DUONEB) 0.5-2.5 (3) MG/3ML SOLN, Take 3 mLs by nebulization every 4 (four) hours as needed., Disp: 360 mL, Rfl: 3 .  Lutein (CVS LUTEIN) 40 MG CAPS, Take 1 capsule by mouth daily., Disp: , Rfl:  .  metoprolol succinate (TOPROL-XL) 25 MG 24 hr tablet, Take 1 tablet (25 mg total) by mouth daily., Disp: 14 tablet, Rfl: 0 .  Multiple Vitamins-Minerals (CENTRUM SILVER ULTRA WOMENS PO), Take by mouth., Disp: , Rfl:  .  Multiple Vitamins-Minerals (PRESERVISION AREDS PO), Take 2 capsules by mouth 2 (two) times daily., Disp: , Rfl:  .  Omega-3 Fatty Acids (FISH OIL) 1000 MG CAPS, Take 1,000 mg by mouth daily., Disp: , Rfl:  .  omeprazole (PRILOSEC) 20 MG capsule, Take 1 capsule (20 mg total) by mouth 2 (two) times daily before a meal., Disp: 180 capsule, Rfl: 3 .  ondansetron (ZOFRAN) 4 MG tablet, Take 1 tablet (4 mg total) by mouth every 6 (six) hours., Disp: 12 tablet, Rfl: 0 .  OXYGEN, Inhale 2 L into the lungs., Disp: , Rfl:  .  Polyethyl Glycol-Propyl Glycol (SYSTANE ULTRA OP), Apply to eye., Disp: , Rfl:  .  potassium chloride (KLOR-CON 10) 10 MEQ tablet, Take 1 tablet (10 mEq  total) by mouth daily., Disp: 90 tablet, Rfl: 1 .  rivaroxaban (XARELTO) 20 MG TABS tablet, Take 1 tablet (20 mg total) by mouth daily with supper., Disp: 90 tablet, Rfl: 3 .  tolterodine (DETROL LA) 4 MG 24 hr capsule, Take 4 mg by mouth daily., Disp: , Rfl:  .  Travoprost, BAK Free, (TRAVATAN Z) 0.004 % SOLN ophthalmic solution, 1 drop at bedtime., Disp: , Rfl:  .  triamcinolone cream (KENALOG) 0.1 %, Apply 1 application topically 2 (two) times daily., Disp: , Rfl:  .  VENTOLIN HFA 108 (90 Base) MCG/ACT inhaler, INHALE TWO PUFFS EVERY 4-6 HOURS ONLY AS NEEDED FOR SHORTNESS OF BREATH OR WHEEZING., Disp: , Rfl: 4

## 2016-12-27 NOTE — Patient Instructions (Signed)
Chronic respiratory failure with hypoxemia: Keep using 2 L of oxygen continuously  Shortness of breath: You may have asthma or COPD, but if you do I think they are mild Try taking Symbicort 2 puffs twice a day for the next week.  If it helps you then continue taking it if it does not then stop. If you decide you would like for me to try to help with shortness of breath more please let me know, we will consider a workup for your heart and lungs at that time.  We will see you back as needed

## 2017-01-03 DIAGNOSIS — J449 Chronic obstructive pulmonary disease, unspecified: Secondary | ICD-10-CM | POA: Diagnosis not present

## 2017-01-10 ENCOUNTER — Encounter: Payer: Self-pay | Admitting: Adult Health

## 2017-01-10 ENCOUNTER — Other Ambulatory Visit (INDEPENDENT_AMBULATORY_CARE_PROVIDER_SITE_OTHER): Payer: Medicare HMO

## 2017-01-10 ENCOUNTER — Ambulatory Visit: Payer: Medicare HMO | Admitting: Adult Health

## 2017-01-10 VITALS — BP 114/60 | HR 75

## 2017-01-10 DIAGNOSIS — R040 Epistaxis: Secondary | ICD-10-CM

## 2017-01-10 DIAGNOSIS — J9611 Chronic respiratory failure with hypoxia: Secondary | ICD-10-CM

## 2017-01-10 LAB — CBC WITH DIFFERENTIAL/PLATELET
BASOS ABS: 0.1 10*3/uL (ref 0.0–0.1)
BASOS PCT: 0.8 % (ref 0.0–3.0)
EOS ABS: 0.3 10*3/uL (ref 0.0–0.7)
Eosinophils Relative: 3.5 % (ref 0.0–5.0)
HEMATOCRIT: 37.8 % (ref 36.0–46.0)
Hemoglobin: 12.3 g/dL (ref 12.0–15.0)
LYMPHS PCT: 25.4 % (ref 12.0–46.0)
Lymphs Abs: 2.3 10*3/uL (ref 0.7–4.0)
MCHC: 32.5 g/dL (ref 30.0–36.0)
MCV: 95.8 fl (ref 78.0–100.0)
MONO ABS: 0.8 10*3/uL (ref 0.1–1.0)
Monocytes Relative: 9 % (ref 3.0–12.0)
NEUTROS ABS: 5.6 10*3/uL (ref 1.4–7.7)
Neutrophils Relative %: 61.3 % (ref 43.0–77.0)
PLATELETS: 218 10*3/uL (ref 150.0–400.0)
RBC: 3.95 Mil/uL (ref 3.87–5.11)
RDW: 15.3 % (ref 11.5–15.5)
WBC: 9.1 10*3/uL (ref 4.0–10.5)

## 2017-01-10 MED ORDER — GLYCOPYRROLATE-FORMOTEROL 9-4.8 MCG/ACT IN AERO: 2.0000 | INHALATION_SPRAY | Freq: Two times a day (BID) | RESPIRATORY_TRACT | 0 refills | Status: DC

## 2017-01-10 NOTE — Addendum Note (Signed)
Addended by: Parke Poisson E on: 01/10/2017 04:59 PM   Modules accepted: Orders

## 2017-01-10 NOTE — Assessment & Plan Note (Signed)
Nosebleed-resolved times 12 hours.  Patient is on Xarelto have advised her to use saline gel for moisture.  Avoid nose blowing bending or heavy lifting.  Refer to ENT for evaluation.  Advised that if she is unable to get nosebleed to stop or it returns and persists will need to seek emergency care.  CBC today.  Hold Xarelto tonight.  May resume tomorrow if no further nosebleeds  Plan  Patient Instructions  Refer to ENT  May use the oxygen pulsing during daytime  Use continuous flow At bedtime  .  Saline gel to nares As needed   No blowing nose or nose spray  Go to Emergency room if nose bleed returns and can not be stopped. -THIS IS AN EMERGENCY .  Hold Xarelto tonight . May resume tomorrow if nose bleed has resolved.  Can use pressure to stop bleeding .  No heavy lifting or bending over.  Please contact office for sooner follow up if symptoms do not improve or worsen or seek emergency care  Follow up with Dr. Lake Bells as  Planned and As needed

## 2017-01-10 NOTE — Progress Notes (Signed)
_0  ID: Becky Newton, female    DOB: Mar 18, 1933, 83 y.o.   MRN: [id number]  Chief Complaint  Patient presents with  . Acute Visit    Nosebleed     Referring provider: [name], NP  HPI: 83 year old female former smoker seen December 27, 2016 to establish for chronic hypoxic respiratory failure.  Patient has been on chronic oxygen since 2013 and needed to establish with a local pulmonologist.   01/10/2017 Acute office visit Patient presents for a work in visit.  Patient complains that she has been having intermittent nosebleeds for the last year however last night had a very severe nosebleed.  It took a long time to get it to stop bleeding.  Patient is on Xarelto and oxygen.  Patient says she has not had any bleeding since 5 AM this morning.  She did apply pressure and put a cotton  ball she removed it 2 hours ago with no residual bleeding.  Patient is followed by ENT Dr. Carman Ching but has not made an appointment with them.  She denies any chest pain orthopnea PND hemoptysis.   Allergies  Allergen Reactions  . Iodine Nausea And Vomiting  . Ivp Dye [Iodinated Diagnostic Agents] Nausea And Vomiting    Immunization History  Administered Date(s) Administered  . Influenza, High Dose Seasonal PF 09/20/2015  . Influenza,inj,Quad PF,6+ Mos 10/31/2016  . Influenza-Unspecified 10/03/2014  . Pneumococcal Conjugate-13 10/31/2016    Past Medical History:  Diagnosis Date  . A-fib ([place])   . Atrial fibrillation ([place])   . Chicken pox   . COPD (chronic obstructive pulmonary disease) ([place])   . Diabetes ([place])   . Fibrocystic breast determined by biopsy 1977  . GERD (gastroesophageal reflux disease)   . Glaucoma   . H/O hernia repair 2006  . H/O left breast biopsy 1982  . Incisional hernia   . Lung disease    Paralyzed left hemidiaphragm  . S/P scar revision   . Uterine cancer ([place])     Tobacco History: Social History   Tobacco Use  Smoking Status Former Smoker  .  Packs/day: 0.50  . Years: 20.00  . Pack years: 10.00  . Types: Cigarettes  . Last attempt to quit: 08/23/1989  . Years since quitting: 27.4  Smokeless Tobacco Never Used   Counseling given: Not Answered   Outpatient Encounter Medications as of 01/10/2017  Medication Sig  . AMBULATORY NON FORMULARY MEDICATION Accu-Chek FastClix Lancets Drum. Use to check blood sugar twice a day. Type 2 diabetes.  . AMBULATORY NON FORMULARY MEDICATION Accu-Chek SmartView glucose testing strips. Use to check blood sugar twice a day.  Type 2 diabetes.  . benzonatate (TESSALON) 200 MG capsule Take 1 capsule (200 mg total) by mouth 3 (three) times daily as needed for cough.  . budesonide-formoterol (SYMBICORT) 160-4.5 MCG/ACT inhaler Inhale 2 puffs into the lungs 2 (two) times daily.  Marland Kitchen diltiazem (CARDIZEM CD) 180 MG 24 hr capsule Take 1 capsule (180 mg total) by mouth daily.  . furosemide (LASIX) 20 MG tablet Take 1 tablet (20 mg total) by mouth 2 (two) times daily.  . hydrochlorothiazide (MICROZIDE) 12.5 MG capsule TAKE 1 CAPSULE EVERY DAY  . ipratropium-albuterol (DUONEB) 0.5-2.5 (3) MG/3ML SOLN Take 3 mLs by nebulization every 4 (four) hours as needed.  . Lutein (CVS LUTEIN) 40 MG CAPS Take 1 capsule by mouth daily.  . metoprolol succinate (TOPROL-XL) 25 MG 24 hr tablet Take 1 tablet (25 mg total) by mouth daily.  . Multiple  Vitamins-Minerals (CENTRUM SILVER ULTRA WOMENS PO) Take by mouth.  . Multiple Vitamins-Minerals (PRESERVISION AREDS PO) Take 2 capsules by mouth 2 (two) times daily.  Marland Kitchen omeprazole (PRILOSEC) 20 MG capsule Take 1 capsule (20 mg total) by mouth 2 (two) times daily before a meal.  . ondansetron (ZOFRAN) 4 MG tablet Take 1 tablet (4 mg total) by mouth every 6 (six) hours.  . OXYGEN Inhale 2 L into the lungs.  Vladimir Faster Glycol-Propyl Glycol (SYSTANE ULTRA OP) Apply to eye.  . potassium chloride (KLOR-CON 10) 10 MEQ tablet Take 1 tablet (10 mEq total) by mouth daily.  . rivaroxaban  (XARELTO) 20 MG TABS tablet Take 1 tablet (20 mg total) by mouth daily with supper.  . tolterodine (DETROL LA) 4 MG 24 hr capsule Take 4 mg by mouth daily.  . Travoprost, BAK Free, (TRAVATAN Z) 0.004 % SOLN ophthalmic solution 1 drop at bedtime.  . VENTOLIN HFA 108 (90 Base) MCG/ACT inhaler INHALE TWO PUFFS EVERY 4-6 HOURS ONLY AS NEEDED FOR SHORTNESS OF BREATH OR WHEEZING.  . Omega-3 Fatty Acids (FISH OIL) 1000 MG CAPS Take 1,000 mg by mouth daily.  Marland Kitchen triamcinolone cream (KENALOG) 0.1 % Apply 1 application topically 2 (two) times daily.   No facility-administered encounter medications on file as of 01/10/2017.      Review of Systems  Constitutional:   No  weight loss, night sweats,  Fevers, chills, fatigue, or  lassitude.  HEENT:   No headaches,  Difficulty swallowing,  Tooth/dental problems, or  Sore throat,                No sneezing, itching, ear ache, nasal congestion, post nasal drip,   CV:  No chest pain,  Orthopnea, PND, swelling in lower extremities, anasarca, dizziness, palpitations, syncope.   GI  No heartburn, indigestion, abdominal pain, nausea, vomiting, diarrhea, change in bowel habits, loss of appetite, bloody stools.   Resp: No shortness of breath with exertion or at rest.  No excess mucus, no productive cough,  No non-productive cough,  No coughing up of blood.  No change in color of mucus.  No wheezing.  No chest wall deformity  Skin: no rash or lesions.  GU: no dysuria, change in color of urine, no urgency or frequency.  No flank pain, no hematuria   MS:  No joint pain or swelling.  No decreased range of motion.  No back pain.    Physical Exam  BP 114/60 (BP Location: Right Arm, Cuff Size: Normal)   Pulse 75   SpO2 94%   GEN: A/Ox3; pleasant , NAD, obese female in wheelchair on oxygen   HEENT:  [place]/AT,  EACs-clear, TMs-wnl, NOSE-clear no active bleeding noted no crusting or dried blood noted. , THROAT-clear, no lesions, no postnasal drip or exudate noted.    NECK:  Supple w/ fair ROM; no JVD; normal carotid impulses w/o bruits; no thyromegaly or nodules palpated; no lymphadenopathy.    RESP  Clear  P & A; w/o, wheezes/ rales/ or rhonchi. no accessory muscle use, no dullness to percussion  CARD:  RRR, no m/r/g, tr peripheral edema, pulses intact, no cyanosis or clubbing.  GI:   Soft & nt; nml bowel sounds; no organomegaly or masses detected.   Musco: Warm bil, no deformities or joint swelling noted.   Neuro: alert, no focal deficits noted.    Skin: Warm, no lesions or rashes    Lab Results:  CBC  BMET   BNP No results found for:  BNP  ProBNP No results found for: PROBNP  Imaging: No results found.   Assessment & Plan:   Chronic respiratory failure Patient advised to use pulsing/demand oxygen during the daytime.  And use continuous flow with humidification at bedtime.   patient may use humidifier.    Epistaxis Nosebleed-resolved times 12 hours.  Patient is on Xarelto have advised her to use saline gel for moisture.  Avoid nose blowing bending or heavy lifting.  Refer to ENT for evaluation.  Advised that if she is unable to get nosebleed to stop or it returns and persists will need to seek emergency care.  CBC today.  Hold Xarelto tonight.  May resume tomorrow if no further nosebleeds  Plan  Patient Instructions  Refer to ENT  May use the oxygen pulsing during daytime  Use continuous flow At bedtime  .  Saline gel to nares As needed   No blowing nose or nose spray  Go to Emergency room if nose bleed returns and can not be stopped. -THIS IS AN EMERGENCY .  Hold Xarelto tonight . May resume tomorrow if nose bleed has resolved.  Can use pressure to stop bleeding .  No heavy lifting or bending over.  Please contact office for sooner follow up if symptoms do not improve or worsen or seek emergency care  Follow up with Dr. Lake Bells as  Planned and As needed           Rexene Edison, NP 01/10/2017

## 2017-01-10 NOTE — Patient Instructions (Signed)
Refer to ENT  May use the oxygen pulsing during daytime  Use continuous flow At bedtime  .  Saline gel to nares As needed   No blowing nose or nose spray  Go to Emergency room if nose bleed returns and can not be stopped. -THIS IS AN EMERGENCY .  Hold Xarelto tonight . May resume tomorrow if nose bleed has resolved.  Can use pressure to stop bleeding .  No heavy lifting or bending over.  Please contact office for sooner follow up if symptoms do not improve or worsen or seek emergency care  Follow up with Dr. Lake Bells as  Planned and As needed

## 2017-01-10 NOTE — Progress Notes (Signed)
Reviewed, agree 

## 2017-01-10 NOTE — Assessment & Plan Note (Addendum)
Patient advised to use pulsing/demand oxygen during the daytime.  And use continuous flow with humidification at bedtime.   patient may use humidifier.

## 2017-01-11 ENCOUNTER — Encounter: Payer: Self-pay | Admitting: Adult Health

## 2017-01-16 ENCOUNTER — Other Ambulatory Visit: Payer: Self-pay | Admitting: Adult Health

## 2017-01-16 DIAGNOSIS — R040 Epistaxis: Secondary | ICD-10-CM

## 2017-01-22 ENCOUNTER — Encounter: Payer: Self-pay | Admitting: Adult Health

## 2017-01-23 NOTE — Progress Notes (Signed)
Reviewed, agree 

## 2017-01-25 ENCOUNTER — Encounter: Payer: Self-pay | Admitting: Adult Health

## 2017-02-03 DIAGNOSIS — J449 Chronic obstructive pulmonary disease, unspecified: Secondary | ICD-10-CM | POA: Diagnosis not present

## 2017-02-22 LAB — HM DIABETES EYE EXAM

## 2017-03-04 DIAGNOSIS — H401133 Primary open-angle glaucoma, bilateral, severe stage: Secondary | ICD-10-CM | POA: Diagnosis not present

## 2017-03-06 DIAGNOSIS — J449 Chronic obstructive pulmonary disease, unspecified: Secondary | ICD-10-CM | POA: Diagnosis not present

## 2017-03-08 ENCOUNTER — Encounter (HOSPITAL_COMMUNITY): Payer: Self-pay | Admitting: Emergency Medicine

## 2017-03-08 ENCOUNTER — Observation Stay (HOSPITAL_BASED_OUTPATIENT_CLINIC_OR_DEPARTMENT_OTHER): Payer: Medicare HMO

## 2017-03-08 ENCOUNTER — Emergency Department (HOSPITAL_COMMUNITY): Payer: Medicare HMO

## 2017-03-08 ENCOUNTER — Observation Stay (HOSPITAL_COMMUNITY)
Admission: EM | Admit: 2017-03-08 | Discharge: 2017-03-09 | Disposition: A | Discharge status: Home / Self Care | Payer: Medicare HMO | Attending: Internal Medicine | Admitting: Internal Medicine

## 2017-03-08 ENCOUNTER — Other Ambulatory Visit: Payer: Self-pay

## 2017-03-08 DIAGNOSIS — Z7901 Long term (current) use of anticoagulants: Secondary | ICD-10-CM | POA: Diagnosis not present

## 2017-03-08 DIAGNOSIS — R0789 Other chest pain: Principal | ICD-10-CM | POA: Insufficient documentation

## 2017-03-08 DIAGNOSIS — I361 Nonrheumatic tricuspid (valve) insufficiency: Secondary | ICD-10-CM | POA: Diagnosis not present

## 2017-03-08 DIAGNOSIS — Z79899 Other long term (current) drug therapy: Secondary | ICD-10-CM | POA: Diagnosis not present

## 2017-03-08 DIAGNOSIS — Z8249 Family history of ischemic heart disease and other diseases of the circulatory system: Secondary | ICD-10-CM | POA: Insufficient documentation

## 2017-03-08 DIAGNOSIS — Z87891 Personal history of nicotine dependence: Secondary | ICD-10-CM | POA: Insufficient documentation

## 2017-03-08 DIAGNOSIS — I480 Paroxysmal atrial fibrillation: Secondary | ICD-10-CM | POA: Diagnosis not present

## 2017-03-08 DIAGNOSIS — H409 Unspecified glaucoma: Secondary | ICD-10-CM | POA: Diagnosis not present

## 2017-03-08 DIAGNOSIS — J986 Disorders of diaphragm: Secondary | ICD-10-CM | POA: Insufficient documentation

## 2017-03-08 DIAGNOSIS — R072 Precordial pain: Secondary | ICD-10-CM

## 2017-03-08 DIAGNOSIS — E119 Type 2 diabetes mellitus without complications: Secondary | ICD-10-CM | POA: Diagnosis not present

## 2017-03-08 DIAGNOSIS — Z6837 Body mass index (BMI) 37.0-37.9, adult: Secondary | ICD-10-CM | POA: Insufficient documentation

## 2017-03-08 DIAGNOSIS — I110 Hypertensive heart disease with heart failure: Secondary | ICD-10-CM | POA: Diagnosis not present

## 2017-03-08 DIAGNOSIS — I4891 Unspecified atrial fibrillation: Secondary | ICD-10-CM

## 2017-03-08 DIAGNOSIS — Z8542 Personal history of malignant neoplasm of other parts of uterus: Secondary | ICD-10-CM | POA: Diagnosis not present

## 2017-03-08 DIAGNOSIS — R0602 Shortness of breath: Secondary | ICD-10-CM | POA: Diagnosis not present

## 2017-03-08 DIAGNOSIS — J449 Chronic obstructive pulmonary disease, unspecified: Secondary | ICD-10-CM | POA: Insufficient documentation

## 2017-03-08 DIAGNOSIS — I5032 Chronic diastolic (congestive) heart failure: Secondary | ICD-10-CM | POA: Diagnosis not present

## 2017-03-08 DIAGNOSIS — Z9981 Dependence on supplemental oxygen: Secondary | ICD-10-CM | POA: Insufficient documentation

## 2017-03-08 DIAGNOSIS — J9610 Chronic respiratory failure, unspecified whether with hypoxia or hypercapnia: Secondary | ICD-10-CM | POA: Diagnosis not present

## 2017-03-08 DIAGNOSIS — R002 Palpitations: Secondary | ICD-10-CM | POA: Diagnosis not present

## 2017-03-08 DIAGNOSIS — K219 Gastro-esophageal reflux disease without esophagitis: Secondary | ICD-10-CM | POA: Insufficient documentation

## 2017-03-08 DIAGNOSIS — J441 Chronic obstructive pulmonary disease with (acute) exacerbation: Secondary | ICD-10-CM | POA: Diagnosis present

## 2017-03-08 DIAGNOSIS — R079 Chest pain, unspecified: Secondary | ICD-10-CM | POA: Diagnosis present

## 2017-03-08 LAB — CBC
HCT: 40.6 % (ref 36.0–46.0)
Hemoglobin: 13 g/dL (ref 12.0–15.0)
MCH: 31 pg (ref 26.0–34.0)
MCHC: 32 g/dL (ref 30.0–36.0)
MCV: 96.7 fL (ref 78.0–100.0)
PLATELETS: 223 10*3/uL (ref 150–400)
RBC: 4.2 MIL/uL (ref 3.87–5.11)
RDW: 14.4 % (ref 11.5–15.5)
WBC: 8.8 10*3/uL (ref 4.0–10.5)

## 2017-03-08 LAB — BASIC METABOLIC PANEL
Anion gap: 11 (ref 5–15)
BUN: 16 mg/dL (ref 6–20)
CHLORIDE: 100 mmol/L — AB (ref 101–111)
CO2: 30 mmol/L (ref 22–32)
CREATININE: 0.93 mg/dL (ref 0.44–1.00)
Calcium: 9.4 mg/dL (ref 8.9–10.3)
GFR calc Af Amer: >60 mL/min (ref >60)
GFR calc non Af Amer: 56 mL/min — ABNORMAL LOW (ref >60)
Glucose, Bld: 142 mg/dL — ABNORMAL HIGH (ref 65–99)
Potassium: 3.9 mmol/L (ref 3.5–5.1)
Sodium: 141 mmol/L (ref 135–145)

## 2017-03-08 LAB — HEPATIC FUNCTION PANEL
ALBUMIN: 3.4 g/dL — AB (ref 3.5–5.0)
ALK PHOS: 70 U/L (ref 38–126)
ALT: 12 U/L — ABNORMAL LOW (ref 14–54)
AST: 19 U/L (ref 15–41)
BILIRUBIN TOTAL: 0.3 mg/dL (ref 0.3–1.2)
Bilirubin, Direct: 0.2 mg/dL (ref 0.1–0.5)
Indirect Bilirubin: 0.1 mg/dL — ABNORMAL LOW (ref 0.3–0.9)
Total Protein: 6.5 g/dL (ref 6.5–8.1)

## 2017-03-08 LAB — I-STAT TROPONIN, ED
Troponin i, poc: 0 ng/mL (ref 0.00–0.08)
Troponin i, poc: 0.01 ng/mL (ref 0.00–0.08)

## 2017-03-08 LAB — URINALYSIS, ROUTINE W REFLEX MICROSCOPIC
Bilirubin Urine: NEGATIVE
Glucose, UA: NEGATIVE mg/dL
Hgb urine dipstick: NEGATIVE
Ketones, ur: NEGATIVE mg/dL
Leukocytes, UA: NEGATIVE
Nitrite: NEGATIVE
PROTEIN: NEGATIVE mg/dL
Specific Gravity, Urine: 1.005 (ref 1.005–1.030)
pH: 6 (ref 5.0–8.0)

## 2017-03-08 LAB — GLUCOSE, CAPILLARY
GLUCOSE-CAPILLARY: 145 mg/dL — AB (ref 65–99)
Glucose-Capillary: 128 mg/dL — ABNORMAL HIGH (ref 65–99)
Glucose-Capillary: 137 mg/dL — ABNORMAL HIGH (ref 65–99)

## 2017-03-08 LAB — TROPONIN I
Troponin I: <0.03 ng/mL (ref <0.03)
Troponin I: <0.03 ng/mL (ref <0.03)

## 2017-03-08 LAB — ECHOCARDIOGRAM COMPLETE

## 2017-03-08 LAB — MAGNESIUM: MAGNESIUM: 1.7 mg/dL (ref 1.7–2.4)

## 2017-03-08 MED ORDER — LATANOPROST 0.005 % OP SOLN
1.0000 [drp] | Freq: Every day | OPHTHALMIC | Status: DC
  Administered 2017-03-08: 1 [drp] via OPHTHALMIC
  Filled 2017-03-08: qty 2.5

## 2017-03-08 MED ORDER — PANTOPRAZOLE SODIUM 40 MG PO TBEC
40.0000 mg | DELAYED_RELEASE_TABLET | Freq: Every day | ORAL | Status: DC
  Administered 2017-03-08 – 2017-03-09 (×2): 40 mg via ORAL
  Filled 2017-03-08 (×2): qty 1

## 2017-03-08 MED ORDER — HYDROCHLOROTHIAZIDE 12.5 MG PO CAPS
12.5000 mg | ORAL_CAPSULE | Freq: Every day | ORAL | Status: DC
  Administered 2017-03-08 – 2017-03-09 (×2): 12.5 mg via ORAL
  Filled 2017-03-08 (×2): qty 1

## 2017-03-08 MED ORDER — DILTIAZEM HCL ER COATED BEADS 180 MG PO CP24
180.0000 mg | ORAL_CAPSULE | Freq: Every day | ORAL | Status: DC
  Administered 2017-03-08: 180 mg via ORAL
  Filled 2017-03-08 (×2): qty 1

## 2017-03-08 MED ORDER — INSULIN ASPART 100 UNIT/ML ~~LOC~~ SOLN
0.0000 [IU] | Freq: Three times a day (TID) | SUBCUTANEOUS | Status: DC
  Administered 2017-03-08 – 2017-03-09 (×3): 1 [IU] via SUBCUTANEOUS

## 2017-03-08 MED ORDER — RIVAROXABAN 20 MG PO TABS
20.0000 mg | ORAL_TABLET | Freq: Every day | ORAL | Status: DC
  Administered 2017-03-08: 20 mg via ORAL
  Filled 2017-03-08: qty 1

## 2017-03-08 MED ORDER — POTASSIUM CHLORIDE ER 10 MEQ PO TBCR
10.0000 meq | EXTENDED_RELEASE_TABLET | Freq: Every day | ORAL | Status: DC
  Administered 2017-03-08 – 2017-03-09 (×2): 10 meq via ORAL
  Filled 2017-03-08 (×3): qty 1

## 2017-03-08 MED ORDER — METOPROLOL SUCCINATE ER 25 MG PO TB24
25.0000 mg | ORAL_TABLET | Freq: Every day | ORAL | Status: DC
  Administered 2017-03-08 – 2017-03-09 (×2): 25 mg via ORAL
  Filled 2017-03-08 (×2): qty 1

## 2017-03-08 MED ORDER — IPRATROPIUM-ALBUTEROL 0.5-2.5 (3) MG/3ML IN SOLN
3.0000 mL | RESPIRATORY_TRACT | Status: DC | PRN
  Administered 2017-03-08: 3 mL via RESPIRATORY_TRACT
  Filled 2017-03-08: qty 3

## 2017-03-08 MED ORDER — VITAMIN D3 25 MCG (1000 UNIT) PO TABS
1000.0000 [IU] | ORAL_TABLET | Freq: Every day | ORAL | Status: DC
  Administered 2017-03-08 – 2017-03-09 (×2): 1000 [IU] via ORAL
  Filled 2017-03-08 (×2): qty 1

## 2017-03-08 MED ORDER — FUROSEMIDE 20 MG PO TABS
20.0000 mg | ORAL_TABLET | Freq: Two times a day (BID) | ORAL | Status: DC
  Administered 2017-03-08 (×2): 20 mg via ORAL
  Filled 2017-03-08 (×3): qty 1

## 2017-03-08 MED ORDER — LUTEIN 40 MG PO CAPS: 1.0000 | ORAL_CAPSULE | Freq: Every day | ORAL | Status: DC

## 2017-03-08 NOTE — H&P (Signed)
[facility] History and Physical  Becky Newton:[id number] DOB: 1933-12-05 DOA: 03/08/2017  Referring physician:  PCP: [name], NP  Specialists:   Chief Complaint: palpitations and chest pressure   HPI: Becky Newton is a 84 y.o. female with PMH of HTN, dCHF, PAF (on xarelto), Left hemidiaphragm paralysis, COPD on home oxygen (2lpm), DM presented with palpitations and chest pressure. Patient states that's she wok up around 0.1.00AM with mild substernal, no radiating chest pressure sensations. She felt her heart rate racing and checked her heart rate with blood pressure monitor and was found to be around 150's. She was instructed to take extra diltiazem for palpitation. She took 120 mg of Cardizem and heart rate has improved.  She denied exertional chest pains or shortness of breath. No PNDs or orthopnea's. Denies fevers, no cough, no leg edema.  -ED: heart rate was in sinus around 80's then episode of rvr, resolved spontaneously. hospitalist is called for observation    Review of Systems: The patient denies anorexia, fever, weight loss,, vision loss, decreased hearing, hoarseness, chest pain, syncope, dyspnea on exertion, peripheral edema, balance deficits, hemoptysis, abdominal pain, melena, hematochezia, severe indigestion/heartburn, hematuria, incontinence, genital sores, muscle weakness, suspicious skin lesions, transient blindness, difficulty walking, depression, unusual weight change, abnormal bleeding, enlarged lymph nodes, angioedema, and breast masses.    Past Medical History:  Diagnosis Date  . A-fib ([place])   . Atrial fibrillation ([place])   . Chicken pox   . COPD (chronic obstructive pulmonary disease) ([place])   . Diabetes ([place])   . Fibrocystic breast determined by biopsy 1977  . GERD (gastroesophageal reflux disease)   . Glaucoma   . H/O hernia repair 2006  . H/O left breast biopsy 1982  . Incisional hernia   . Lung disease    Paralyzed left hemidiaphragm  .  S/P scar revision   . Uterine cancer [facility])    Past Surgical History:  Procedure Laterality Date  . APPENDECTOMY  1966  . BREAST BIOPSY    . CATARACT EXTRACTION  2004,2005  . CHOLECYSTECTOMY  1975  . HERNIA REPAIR  2006  . HIATAL HERNIA REPAIR  1966  . NISSEN FUNDOPLICATION    . TOTAL ABDOMINAL HYSTERECTOMY W/ BILATERAL SALPINGOOPHORECTOMY  1974   hx of cancer    Social History:  reports that she quit smoking about 27 years ago. Her smoking use included cigarettes. She has a 10.00 pack-year smoking history. she has never used smokeless tobacco. She reports that she does not drink alcohol or use drugs. Home;  where does patient live--home, ALF, SNF? and with whom if at home? Yes;  Can patient participate in ADLs?  Allergies  Allergen Reactions  . Iodine Nausea And Vomiting  . Ivp Dye [Iodinated Diagnostic Agents] Nausea And Vomiting    Family History  Problem Relation Age of Onset  . Breast cancer Mother   . Arthritis Mother   . Stroke Mother   . Heart attack Mother   . Heart disease Mother   . Alcohol abuse Brother     (be sure to complete)  Prior to Admission medications   Medication Sig Start Date End Date Taking? Authorizing Provider  cholecalciferol (VITAMIN D) 1000 units tablet Take 1,000 Units by mouth daily.   Yes [provider]  diltiazem (CARDIZEM CD) 180 MG 24 hr capsule Take 1 capsule (180 mg total) by mouth daily. 09/26/16  Yes Nafziger, Tommi Rumps, NP  diltiazem (DILACOR XR) 120 MG 24 hr capsule Take 120 mg by  mouth as needed (ATRIAL FIBRILATION).   Yes [provider]  furosemide (LASIX) 20 MG tablet Take 1 tablet (20 mg total) by mouth 2 (two) times daily. 12/25/16  Yes Nafziger, Tommi Rumps, NP  hydrochlorothiazide (MICROZIDE) 12.5 MG capsule TAKE 1 CAPSULE EVERY DAY 11/30/16  Yes Nafziger, Tommi Rumps, NP  ipratropium-albuterol (DUONEB) 0.5-2.5 (3) MG/3ML SOLN Take 3 mLs by nebulization every 4 (four) hours as needed. Patient taking differently: Take 3 mLs by  nebulization every 4 (four) hours as needed (sob).  06/20/16  Yes Breeback, Jade L, PA-C  latanoprost (XALATAN) 0.005 % ophthalmic solution Place 1 drop into both eyes at bedtime.   Yes [provider]  Lutein ([employer] LUTEIN) 40 MG CAPS Take 1 capsule by mouth daily.   Yes [provider]  metoprolol succinate (TOPROL-XL) 25 MG 24 hr tablet Take 1 tablet (25 mg total) by mouth daily. 11/15/16  Yes Nafziger, Tommi Rumps, NP  Multiple Vitamins-Minerals (CENTRUM SILVER ULTRA WOMENS PO) Take 1 tablet by mouth daily.    Yes [provider]  Multiple Vitamins-Minerals (PRESERVISION AREDS PO) Take 1 capsule by mouth 2 (two) times daily.    Yes [provider]  omeprazole (PRILOSEC) 20 MG capsule Take 1 capsule (20 mg total) by mouth 2 (two) times daily before a meal. 06/20/16  Yes Breeback, Jade L, PA-C  ondansetron (ZOFRAN) 4 MG tablet Take 1 tablet (4 mg total) by mouth every 6 (six) hours. 11/27/15  Yes Phelps, Erin O, PA-C  potassium chloride (KLOR-CON 10) 10 MEQ tablet Take 1 tablet (10 mEq total) by mouth daily. 10/11/16  Yes Nafziger, Tommi Rumps, NP  rivaroxaban (XARELTO) 20 MG TABS tablet Take 1 tablet (20 mg total) by mouth daily with supper. 12/21/16  Yes Nafziger, Tommi Rumps, NP  VENTOLIN HFA 108 (90 Base) MCG/ACT inhaler INHALE TWO PUFFS EVERY 4-6 HOURS ONLY AS NEEDED FOR SHORTNESS OF BREATH OR WHEEZING. 06/19/16  Yes [provider]  AMBULATORY NON FORMULARY MEDICATION Accu-Chek FastClix Lancets Drum. Use to check blood sugar twice a day. Type 2 diabetes. 06/14/15   Hommel, Hilliard Clark, DO  AMBULATORY NON FORMULARY MEDICATION Accu-Chek SmartView glucose testing strips. Use to check blood sugar twice a day.  Type 2 diabetes. 03/22/16   Breeback, Jade L, PA-C  benzonatate (TESSALON) 200 MG capsule Take 1 capsule (200 mg total) by mouth 3 (three) times daily as needed for cough. Patient not taking: Reported on 03/08/2017 06/20/16   Donella Stade, PA-C  budesonide-formoterol (SYMBICORT)  160-4.5 MCG/ACT inhaler Inhale 2 puffs into the lungs 2 (two) times daily. Patient not taking: Reported on 03/08/2017 06/25/16   Donella Stade, PA-C  Glycopyrrolate-Formoterol (BEVESPI AEROSPHERE) 9-4.8 MCG/ACT AERO Inhale 2 puffs into the lungs 2 (two) times daily. Patient not taking: Reported on 03/08/2017 01/10/17   Parrett, Fonnie Mu, NP  OXYGEN Inhale 2 L into the lungs.    [provider]   Physical Exam: Vitals:   03/08/17 0700 03/08/17 0730  BP: 123/69 (!) 123/57  Pulse: 73 (!) 59  Resp: 20 (!) 21  Temp:    SpO2: 98% 98%     General:  Alert. No distress   Eyes: eom-I, perrla   ENT: no oral ulcers   Neck: supple, no JVD  Cardiovascular: s1,s2 rrr  Respiratory: few crackles at bases   Abdomen: soft, nt, obese   Skin: no rash   Musculoskeletal: no joint edema   Psychiatric: no hallucinations   Neurologic: cn 2-12 intact. [place] 5/5 BL  Labs on Admission:  Basic Metabolic Panel: Recent Labs  Lab 03/08/17 0450 03/08/17 0457  NA 141  --   K 3.9  --   CL 100*  --   CO2 30  --   GLUCOSE 142*  --   BUN 16  --   CREATININE 0.93  --   CALCIUM 9.4  --   MG  --  1.7   Liver Function Tests: Recent Labs  Lab 03/08/17 0457  AST 19  ALT 12*  ALKPHOS 70  BILITOT 0.3  PROT 6.5  ALBUMIN 3.4*   No results for input(s): LIPASE, AMYLASE in the last 168 hours. No results for input(s): AMMONIA in the last 168 hours. CBC: Recent Labs  Lab 03/08/17 0450  WBC 8.8  HGB 13.0  HCT 40.6  MCV 96.7  PLT 223   Cardiac Enzymes: No results for input(s): CKTOTAL, CKMB, CKMBINDEX, TROPONINI in the last 168 hours.  BNP (last 3 results) No results for input(s): BNP in the last 8760 hours.  ProBNP (last 3 results) No results for input(s): PROBNP in the last 8760 hours.  CBG: No results for input(s): GLUCAP in the last 168 hours.  Radiological Exams on Admission: Dg Chest 2 View  Result Date: 03/08/2017 CLINICAL DATA:  Awoke from sleep with chest pain and  shortness of breath. EXAM: CHEST  2 VIEW COMPARISON:  06/20/2016 FINDINGS: Stable cardiomegaly. Unchanged mediastinal contours with aortic atherosclerosis. Chronic blunting of both costophrenic angles, likely scarring. Mild vascular congestion. No confluent airspace disease. No pneumothorax. No acute osseous abnormalities. Surgical clips in the region of the gastroesophageal junction. IMPRESSION: Chronic cardiomegaly and basilar scarring. Mild vascular congestion. Electronically Signed   By: Jeb Levering M.D.   On: 03/08/2017 05:34    EKG: Independently reviewed.   Assessment/Plan Active Problems:   Chest pain   Morbid obesity ([place])   84 y.o. female with PMH of HTN, dCHF, PAF (on xarelto), Left hemidiaphragm paralysis, COPD on home oxygen (2lpm), DM presented with palpitations and chest pressure.   Chest pressure in the setting of a fib RVR. Resolved, no recurrence of chest pain. ECG/initial trop: unremarkable. Will monitor with serial trop, obtain echo. Likely needs outpatient cardiology follow up with stress test at discharge  PAF. Episode of RVR at home/ED. Resolved. Will cont monitor on tele. Cont cardizem, metoprolol, xarelto    CHF. Chronic Diastolic dysfunction. No s/s of acuet HF on exam. Cont home regimen. Monitor   Left hemidiaphragm paralysis, COPD on home oxygen. No s/s of copd exacerbation. Cont home bronchodilators as needed  DM. Monitor on ISS   None./  if consultant consulted, please document name and whether formally or informally consulted  Code Status: full (must indicate code status--if unknown or must be presumed, indicate so) Family Communication: d/w patient, her family. ED (indicate person spoken with, if applicable, with phone number if by telephone) Disposition Plan: home tomorrow  (indicate anticipated LOS)  Time spent: >45 minutes   Kinnie Feil [facility] Pager [phone]  If 7PM-7AM, please contact night-coverage [email or web address] Password  [facility] 03/08/2017, 8:24 AM

## 2017-03-08 NOTE — ED Provider Notes (Signed)
Seen as sign out from Dr. Sherry Ruffing  Patient's presentation today is for an episode of palpitations and associated chest pain. CP and palpitations occurred around 0100. Patient denies prior cardiac catheterization.  Her last reported stress test was 2-3 years prior. Primary cardiology is Dr. [name]. Patient with prior history significant for COPD, diabetes, known paroxysmal A. Fib, and morbid obesity.  Initial EKG and lab findings are reassuring.  However, given patient's multiple cardiac risk factors and reported episode of chest pain will admit for further workup and observation.  Dr. Daleen Bo of the hospitalist service is aware of the case.      Becky Merino, MD 03/08/17 [phone]

## 2017-03-08 NOTE — Plan of Care (Signed)
  Progressing Education: Knowledge of General Education information will improve 03/08/2017 2225 - Progressing by Talbert Forest, RN Health Behavior/Discharge Planning: Ability to manage health-related needs will improve 03/08/2017 2225 - Progressing by Talbert Forest, RN Clinical Measurements: Ability to maintain clinical measurements within normal limits will improve 03/08/2017 2225 - Progressing by Talbert Forest, RN Will remain free from infection 03/08/2017 2225 - Progressing by Talbert Forest, RN Diagnostic test results will improve 03/08/2017 2225 - Progressing by Talbert Forest, RN Respiratory complications will improve 03/08/2017 2225 - Progressing by Talbert Forest, RN Cardiovascular complication will be avoided 03/08/2017 2225 - Progressing by Talbert Forest, RN Activity: Risk for activity intolerance will decrease 03/08/2017 2225 - Progressing by Talbert Forest, RN Nutrition: Adequate nutrition will be maintained 03/08/2017 2225 - Progressing by Talbert Forest, RN Coping: Level of anxiety will decrease 03/08/2017 2225 - Progressing by Talbert Forest, RN Elimination: Will not experience complications related to urinary retention 03/08/2017 2225 - Progressing by Talbert Forest, RN Pain Managment: General experience of comfort will improve 03/08/2017 2225 - Progressing by Talbert Forest, RN Safety: Ability to remain free from injury will improve 03/08/2017 2225 - Progressing by Talbert Forest, RN Skin Integrity: Risk for impaired skin integrity will decrease 03/08/2017 2225 - Progressing by Talbert Forest, RN

## 2017-03-08 NOTE — ED Notes (Signed)
Report given to [facility] [phone].

## 2017-03-08 NOTE — ED Provider Notes (Signed)
[facility] DEPT Provider Note   CSN: [id number] Arrival date & time: 03/08/17  0427     History   Chief Complaint Chief Complaint  Patient presents with  . Chest Pain    HPI Becky Newton is a 84 y.o. female.  The history is provided by medical records, the patient and the spouse. No language interpreter was used.  Palpitations   This is a recurrent problem. The current episode started 3 to 5 hours ago. The problem occurs rarely. The problem has been resolved. The problem is associated with an unknown factor. On average, each episode lasts 1 hour. Associated symptoms include malaise/fatigue, chest pain, irregular heartbeat and shortness of breath. Pertinent negatives include no diaphoresis, no fever, no numbness, no chest pressure, no exertional chest pressure, no syncope, no abdominal pain, no nausea, no vomiting, no headaches, no back pain, no lower extremity edema, no weakness, no cough and no sputum production. Treatments tried: dilt. The treatment provided significant relief.    Past Medical History:  Diagnosis Date  . A-fib ([place])   . Atrial fibrillation ([place])   . Chicken pox   . COPD (chronic obstructive pulmonary disease) ([place])   . Diabetes ([employer])   . Fibrocystic breast determined by biopsy 1977  . GERD (gastroesophageal reflux disease)   . Glaucoma   . H/O hernia repair 2006  . H/O left breast biopsy 1982  . Incisional hernia   . Lung disease    Paralyzed left hemidiaphragm  . S/P scar revision   . Uterine cancer [facility])     Patient Active Problem List   Diagnosis Date Noted  . Chronic obstructive pulmonary disease ([place]) 08/22/2016  . Epistaxis 03/05/2016  . Hematoma of leg, right, initial encounter 11/29/2015  . Ischemic optic neuropathy of right eye 11/29/2015  . Vitreous degeneration, bilateral 11/29/2015  . Overactive bladder 08/19/2015  . Lower extremity edema 08/19/2015  . Stasis dermatitis 08/19/2015  . Paroxysmal  atrial fibrillation ([place]) 11/03/2014  . Chest pain 11/03/2014  . Morbid obesity ([place]) 11/03/2014  . Chronic respiratory failure ([place]) 09/02/2014  . OSA (obstructive sleep apnea) 09/02/2014  . Hypercalcemia 09/02/2014  . Asthma, chronic 08/24/2014  . Type 2 diabetes mellitus ([place]) 08/24/2014  . History of gastric ulcer 08/24/2014  . Paralysis, diaphragm 08/24/2014  . History of Nissen fundoplication 27/51/7001  . Pulmonary hypertension due to lung disease ([place]) 08/24/2014  . Open-angle glaucoma 08/24/2014    Past Surgical History:  Procedure Laterality Date  . APPENDECTOMY  1966  . BREAST BIOPSY    . CATARACT EXTRACTION  2004,2005  . CHOLECYSTECTOMY  1975  . HERNIA REPAIR  2006  . HIATAL HERNIA REPAIR  1966  . NISSEN FUNDOPLICATION    . TOTAL ABDOMINAL HYSTERECTOMY W/ BILATERAL SALPINGOOPHORECTOMY  1974   hx of cancer     OB History    No data available       Home Medications    Prior to Admission medications   Medication Sig Start Date End Date Taking? Authorizing Provider  AMBULATORY NON FORMULARY MEDICATION Accu-Chek FastClix Lancets Drum. Use to check blood sugar twice a day. Type 2 diabetes. 06/14/15   Hommel, Hilliard Clark, DO  AMBULATORY NON FORMULARY MEDICATION Accu-Chek SmartView glucose testing strips. Use to check blood sugar twice a day.  Type 2 diabetes. 03/22/16   Breeback, Jade L, PA-C  benzonatate (TESSALON) 200 MG capsule Take 1 capsule (200 mg total) by mouth 3 (three) times daily as needed for cough. 06/20/16  Breeback, Jade L, PA-C  budesonide-formoterol (SYMBICORT) 160-4.5 MCG/ACT inhaler Inhale 2 puffs into the lungs 2 (two) times daily. 06/25/16   Breeback, Jade L, PA-C  diltiazem (CARDIZEM CD) 180 MG 24 hr capsule Take 1 capsule (180 mg total) by mouth daily. 09/26/16   Nafziger, Tommi Rumps, NP  furosemide (LASIX) 20 MG tablet Take 1 tablet (20 mg total) by mouth 2 (two) times daily. 12/25/16   Nafziger, Tommi Rumps, NP  Glycopyrrolate-Formoterol (BEVESPI AEROSPHERE) 9-4.8  MCG/ACT AERO Inhale 2 puffs into the lungs 2 (two) times daily. 01/10/17   Parrett, Fonnie Mu, NP  hydrochlorothiazide (MICROZIDE) 12.5 MG capsule TAKE 1 CAPSULE EVERY DAY 11/30/16   Nafziger, Tommi Rumps, NP  ipratropium-albuterol (DUONEB) 0.5-2.5 (3) MG/3ML SOLN Take 3 mLs by nebulization every 4 (four) hours as needed. 06/20/16   Breeback, Jade L, PA-C  Lutein ([employer] LUTEIN) 40 MG CAPS Take 1 capsule by mouth daily.    [provider]  metoprolol succinate (TOPROL-XL) 25 MG 24 hr tablet Take 1 tablet (25 mg total) by mouth daily. 11/15/16   Nafziger, Tommi Rumps, NP  Multiple Vitamins-Minerals (CENTRUM SILVER ULTRA WOMENS PO) Take by mouth.    [provider]  Multiple Vitamins-Minerals (PRESERVISION AREDS PO) Take 2 capsules by mouth 2 (two) times daily.    [provider]  Omega-3 Fatty Acids (FISH OIL) 1000 MG CAPS Take 1,000 mg by mouth daily.    [provider]  omeprazole (PRILOSEC) 20 MG capsule Take 1 capsule (20 mg total) by mouth 2 (two) times daily before a meal. 06/20/16   Breeback, Jade L, PA-C  ondansetron (ZOFRAN) 4 MG tablet Take 1 tablet (4 mg total) by mouth every 6 (six) hours. 11/27/15   Noe Gens, PA-C  OXYGEN Inhale 2 L into the lungs.    [provider]  Polyethyl Glycol-Propyl Glycol (SYSTANE ULTRA OP) Apply to eye.    [provider]  potassium chloride (KLOR-CON 10) 10 MEQ tablet Take 1 tablet (10 mEq total) by mouth daily. 10/11/16   Nafziger, Tommi Rumps, NP  rivaroxaban (XARELTO) 20 MG TABS tablet Take 1 tablet (20 mg total) by mouth daily with supper. 12/21/16   Nafziger, Tommi Rumps, NP  tolterodine (DETROL LA) 4 MG 24 hr capsule Take 4 mg by mouth daily.    [provider]  Travoprost, BAK Free, (TRAVATAN Z) 0.004 % SOLN ophthalmic solution 1 drop at bedtime.    [provider]  triamcinolone cream (KENALOG) 0.1 % Apply 1 application topically 2 (two) times daily.    [provider]  VENTOLIN HFA 108 (90 Base)  MCG/ACT inhaler INHALE TWO PUFFS EVERY 4-6 HOURS ONLY AS NEEDED FOR SHORTNESS OF BREATH OR WHEEZING. 06/19/16   [provider]    Family History Family History  Problem Relation Age of Onset  . Breast cancer Mother   . Arthritis Mother   . Stroke Mother   . Heart attack Mother   . Heart disease Mother   . Alcohol abuse Brother     Social History Social History   Tobacco Use  . Smoking status: Former Smoker    Packs/day: 0.50    Years: 20.00    Pack years: 10.00    Types: Cigarettes    Last attempt to quit: 08/23/1989    Years since quitting: 27.5  . Smokeless tobacco: Never Used  Substance Use Topics  . Alcohol use: No    Alcohol/week: 0.0 oz  . Drug use: No     Allergies   Iodine  and Ivp dye [iodinated diagnostic agents]   Review of Systems Review of Systems  Constitutional: Positive for malaise/fatigue. Negative for chills, diaphoresis, fatigue and fever.  HENT: Negative for congestion.   Eyes: Negative for visual disturbance.  Respiratory: Positive for shortness of breath. Negative for cough, sputum production, choking, chest tightness, wheezing and stridor.   Cardiovascular: Positive for chest pain and palpitations. Negative for leg swelling and syncope.  Gastrointestinal: Negative for abdominal pain, constipation, diarrhea, nausea and vomiting.  Genitourinary: Negative for dysuria.  Musculoskeletal: Negative for back pain, neck pain and neck stiffness.  Neurological: Negative for weakness, light-headedness, numbness and headaches.  Psychiatric/Behavioral: Negative for agitation and confusion.  All other systems reviewed and are negative.    Physical Exam Updated Vital Signs BP 136/72 (BP Location: Left Arm)   Pulse 91   Temp (!) 97.5 F (36.4 C) (Oral)   Resp 16   SpO2 95%   Physical Exam  Constitutional: She appears well-developed and well-nourished. No distress.  HENT:  Head: Normocephalic and atraumatic.  Mouth/Throat: Oropharynx is  clear and moist. No oropharyngeal exudate.  Eyes: Conjunctivae are normal.  Neck: Neck supple.  Cardiovascular: Normal rate, regular rhythm and intact distal pulses.  No murmur heard. Pulmonary/Chest: Effort normal and breath sounds normal. No respiratory distress. She has no wheezes. She has no rales. She exhibits no tenderness.  Abdominal: Soft. There is no tenderness.  Musculoskeletal: She exhibits no edema or tenderness.  Neurological: She is alert. No sensory deficit. She exhibits normal muscle tone.  Skin: Skin is warm and dry. Capillary refill takes less than 2 seconds. No rash noted. She is not diaphoretic. No erythema.  Psychiatric: She has a normal mood and affect.  Nursing note and vitals reviewed.    ED Treatments / Results  Labs (all labs ordered are listed, but only abnormal results are displayed) Labs Reviewed  BASIC METABOLIC PANEL - Abnormal; Notable for the following components:      Result Value   Chloride 100 (*)    Glucose, Bld 142 (*)    GFR calc non Af Amer 56 (*)    All other components within normal limits  HEPATIC FUNCTION PANEL - Abnormal; Notable for the following components:   Albumin 3.4 (*)    ALT 12 (*)    Indirect Bilirubin 0.1 (*)    All other components within normal limits  URINALYSIS, ROUTINE W REFLEX MICROSCOPIC - Abnormal; Notable for the following components:   Color, Urine STRAW (*)    All other components within normal limits  URINE CULTURE  CBC  MAGNESIUM  I-STAT TROPONIN, ED  I-STAT TROPONIN, ED    EKG  EKG Interpretation  Date/Time:  Friday March 08 2017 04:36:19 EST Ventricular Rate:  90 PR Interval:    QRS Duration: 82 QT Interval:  335 QTC Calculation: 410 R Axis:   51 Text Interpretation:  Sinus rhythm Low voltage, precordial leads Abnormal R-wave progression, early transition No prior ECG for comparison.  No STEMI Confirmed by Antony Blackbird [phone]) on 03/08/2017 4:48:57 AM Also confirmed by Antony Blackbird [phone]),  editor Hattie Perch [phone])  on 03/08/2017 7:19:34 AM       Radiology Dg Chest 2 View  Result Date: 03/08/2017 CLINICAL DATA:  Awoke from sleep with chest pain and shortness of breath. EXAM: CHEST  2 VIEW COMPARISON:  06/20/2016 FINDINGS: Stable cardiomegaly. Unchanged mediastinal contours with aortic atherosclerosis. Chronic blunting of both costophrenic angles, likely scarring. Mild vascular congestion. No confluent airspace disease. No pneumothorax.  No acute osseous abnormalities. Surgical clips in the region of the gastroesophageal junction. IMPRESSION: Chronic cardiomegaly and basilar scarring. Mild vascular congestion. Electronically Signed   By: Jeb Levering M.D.   On: 03/08/2017 05:34    Procedures Procedures (including critical care time)  Medications Ordered in ED Medications - No data to display   Initial Impression / Assessment and Plan / ED Course  I have reviewed the triage vital signs and the nursing notes.  Pertinent labs & imaging results that were available during my care of the patient were reviewed by me and considered in my medical decision making (see chart for details).     Becky Newton is a 84 y.o. female with a past medical history significant for atrial fibrillation on Xarelto and diltiazem, diabetes, asthma, diaphragm paralysis, diabetes, GERD and COPD on home oxygen who presents with sudden onset of palpitations, tachycardia, and chest pain.  Patient reports that she woke up at approximately 3 AM with sudden onset of chest discomfort.  She describes it as a sharp discomfort that was 10 out of 10 in severity.  She felt her heart racing and checked her heart rate with a cuff and was found to be in the 150s.  She reported some associated shortness of breath worsen than her baseline.  She says that when this happens she takes her 120 mg extended release diltiazem and rests.  She reports she did this and it improved.  It then returned and she had the fast  heart rate with pain for 1 more hour before coming to the ED.  Upon arrival to the emergency department, her chest pain and palpitations are resolved.  Patient reports no preceding symptoms over the last few days.  She denies recent fevers, chills, shortness of breath, fatigue, nausea, vomiting, urinary symptoms or GI symptoms.  She denies recent medication changes.  She is scheduled to see her PCP on Monday and otherwise has been feeling well.  On initial evaluation, patient's heart rate was a sinus rhythm in the 80s.  During exam, patient did jump up into A. fib with RVR with a rate in the 130s.  This lasted for several seconds before returning normal.  Patient was intermittently having atrial fibrillation with a controlled normal rate.  Given the patient's chest discomfort I suspect this is discomfort due to her arrhythmia and palpitations.  The patient is not having pain when she is not having any rhythm changes.  Patient had workup to look for occult infection electrolyte abnormality dehydration or other significant change which may have contributed to her rhythm changes.  Urinalysis showed no evidence of infection.  Magnesium was normal.  Initial troponin negative.  Hepatic function reassuring.  Metabolic panel showed normal potassium and creatinine.  CBC reassuring.  Chest x-ray showed mild congestion but no pneumonia or significant new fluid overload.  Based on patient's chest discomfort with her history, patient will have a delta troponin collected.  Patient will be observed to watch for recurrent A. fib with RVR.  Patient will be given food and fluids.  Patient will need ambulation to make sure she does not go into further arrhythmias.  If patient has any abnormalities discovered or has continued symptoms, will likely need to touch base with cardiology to determine disposition.  Patient overall appears well and only had one episode of RVR in the ED.  Care transferred to Dr. Francia Greaves while  awaiting reassessment and repeat troponin.  Care transferred in stable condition.  Final Clinical Impressions(s) / ED Diagnoses   Final diagnoses:  Precordial pain  Atrial fibrillation with RVR (HCC)  Palpitations  Shortness of breath     Clinical Impression: 1. Precordial pain   2. Atrial fibrillation with RVR (HCC)   3. Palpitations   4. Shortness of breath     Disposition: Care transferred to Dr. Francia Greaves.   Saina Waage, Gwenyth Allegra, MD 03/08/17 [phone]

## 2017-03-08 NOTE — Progress Notes (Signed)
  Echocardiogram 2D Echocardiogram has been performed.  Becky Newton T Becky Newton 03/08/2017, 2:18 PM

## 2017-03-08 NOTE — ED Notes (Signed)
ED TO INPATIENT HANDOFF REPORT  Name/Age/Gender Becky Newton 84 y.o. female  Code Status    Code Status Orders  (From admission, onward)        Start     Ordered   03/08/17 0841  Full code  Continuous     03/08/17 0842    Code Status History    Date Active Date Inactive Code Status Order ID Comments User Context   This patient has a current code status but no historical code status.      Home/SNF/Other Home  Chief Complaint chest pain   Level of Care/Admitting Diagnosis ED Disposition    ED Disposition Condition Comment   Admit  Hospital Area: [facility] [[id number]]  Level of Care: Telemetry [5]  Admit to tele based on following criteria: Complex arrhythmia (Bradycardia/Tachycardia)  Diagnosis: Chest pain [379024]  Admitting Physician: Kinnie Feil [[id number]]  Attending Physician: Kinnie Feil [[id number]]  PT Class (Do Not Modify): Observation [104]  PT Acc Code (Do Not Modify): Observation [10022]       Medical History Past Medical History:  Diagnosis Date  . A-fib ([place])   . Atrial fibrillation ([place])   . Chicken pox   . COPD (chronic obstructive pulmonary disease) ([place])   . Diabetes ([place])   . Fibrocystic breast determined by biopsy 1977  . GERD (gastroesophageal reflux disease)   . Glaucoma   . H/O hernia repair 2006  . H/O left breast biopsy 1982  . Incisional hernia   . Lung disease    Paralyzed left hemidiaphragm  . S/P scar revision   . Uterine cancer (H[facility])     Allergies Allergies  Allergen Reactions  . Iodine Nausea And Vomiting  . Ivp Dye [Iodinated Diagnostic Agents] Nausea And Vomiting    IV Location/Drains/Wounds Patient Lines/Drains/Airways Status   Active Line/Drains/Airways    Name:   Placement date:   Placement time:   Site:   Days:   Peripheral IV 03/08/17 Right Forearm   03/08/17    0451    Forearm   less than 1          Labs/Imaging Results for orders placed or performed during the hospital  encounter of 03/08/17 (from the past 48 hour(s))  Basic metabolic panel     Status: Abnormal   Collection Time: 03/08/17  4:50 AM  Result Value Ref Range   Sodium 141 135 - 145 mmol/L   Potassium 3.9 3.5 - 5.1 mmol/L   Chloride 100 (L) 101 - 111 mmol/L   CO2 30 22 - 32 mmol/L   Glucose, Bld 142 (H) 65 - 99 mg/dL   BUN 16 6 - 20 mg/dL   Creatinine, Ser 0.93 0.44 - 1.00 mg/dL   Calcium 9.4 8.9 - 10.3 mg/dL   GFR calc non Af Amer 56 (L) >60 mL/min   GFR calc Af Amer >60 >60 mL/min    Comment: (NOTE) The eGFR has been calculated using the CKD EPI equation. This calculation has not been validated in all clinical situations. eGFR's persistently <60 mL/min signify possible Chronic Kidney Disease.    Anion gap 11 5 - 15    Comment: Performed at [facility], [place] [address] [place], [place] [address]  CBC     Status: None   Collection Time: 03/08/17  4:50 AM  Result Value Ref Range   WBC 8.8 4.0 - 10.5 K/uL   RBC 4.20 3.87 - 5.11 MIL/uL   Hemoglobin 13.0 12.0 -  15.0 g/dL   HCT 40.6 36.0 - 46.0 %   MCV 96.7 78.0 - 100.0 fL   MCH 31.0 26.0 - 34.0 pg   MCHC 32.0 30.0 - 36.0 g/dL   RDW 14.4 11.5 - 15.5 %   Platelets 223 150 - 400 K/uL    Comment: Performed at [facility], [place] [address] [place], [place] [address]  I-stat troponin, ED     Status: None   Collection Time: 03/08/17  4:57 AM  Result Value Ref Range   Troponin i, poc 0.00 0.00 - 0.08 ng/mL   Comment 3            Comment: Due to the release kinetics of cTnI, a negative result within the first hours of the onset of symptoms does not rule out myocardial infarction with certainty. If myocardial infarction is still suspected, repeat the test at appropriate intervals.   Hepatic function panel     Status: Abnormal   Collection Time: 03/08/17  4:57 AM  Result Value Ref Range   Total Protein 6.5 6.5 - 8.1 g/dL   Albumin 3.4 (L) 3.5 - 5.0 g/dL   AST 19 15 - 41 U/L   ALT 12 (L) 14 - 54  U/L   Alkaline Phosphatase 70 38 - 126 U/L   Total Bilirubin 0.3 0.3 - 1.2 mg/dL   Bilirubin, Direct 0.2 0.1 - 0.5 mg/dL   Indirect Bilirubin 0.1 (L) 0.3 - 0.9 mg/dL    Comment: Performed at [facility], [place] [address] [place], [place] [address]  Magnesium     Status: None   Collection Time: 03/08/17  4:57 AM  Result Value Ref Range   Magnesium 1.7 1.7 - 2.4 mg/dL    Comment: Performed at [facility], [place] [address], [place], [place] [address]  Urinalysis, Routine w reflex microscopic     Status: Abnormal   Collection Time: 03/08/17  6:41 AM  Result Value Ref Range   Color, Urine STRAW (A) YELLOW   APPearance CLEAR CLEAR   Specific Gravity, Urine 1.005 1.005 - 1.030   pH 6.0 5.0 - 8.0   Glucose, UA NEGATIVE NEGATIVE mg/dL   Hgb urine dipstick NEGATIVE NEGATIVE   Bilirubin Urine NEGATIVE NEGATIVE   Ketones, ur NEGATIVE NEGATIVE mg/dL   Protein, ur NEGATIVE NEGATIVE mg/dL   Nitrite NEGATIVE NEGATIVE   Leukocytes, UA NEGATIVE NEGATIVE    Comment: Performed at [facility] [address], [place], [place] [address]  I-Stat Troponin, ED (not at [facility])     Status: None   Collection Time: 03/08/17  7:46 AM  Result Value Ref Range   Troponin i, poc 0.01 0.00 - 0.08 ng/mL   Comment 3            Comment: Due to the release kinetics of cTnI, a negative result within the first hours of the onset of symptoms does not rule out myocardial infarction with certainty. If myocardial infarction is still suspected, repeat the test at appropriate intervals.   Troponin I (q 6hr x 3)     Status: None   Collection Time: 03/08/17  8:55 AM  Result Value Ref Range   Troponin I <0.03 <0.03 ng/mL    Comment: Performed at [facility], [place] [address], [place],  [address]   Dg Chest 2 View  Result Date: 03/08/2017 CLINICAL DATA:  Awoke from sleep with chest pain and shortness of breath. EXAM: CHEST  2 VIEW  COMPARISON:   06/20/2016 FINDINGS: Stable cardiomegaly. Unchanged mediastinal contours with aortic atherosclerosis. Chronic blunting of both costophrenic angles, likely scarring. Mild vascular congestion. No confluent airspace disease. No pneumothorax. No acute osseous abnormalities. Surgical clips in the region of the gastroesophageal junction. IMPRESSION: Chronic cardiomegaly and basilar scarring. Mild vascular congestion. Electronically Signed   By: Jeb Levering M.D.   On: 03/08/2017 05:34    Pending Labs Unresulted Labs (From admission, onward)   Start     Ordered   03/08/17 0843  Troponin I (q 6hr x 3)  Now then every 6 hours,   R     03/08/17 0842   03/08/17 0511  Urine culture  STAT,   STAT     03/08/17 0510      Vitals/Pain Today's Vitals   03/08/17 0930 03/08/17 0955 03/08/17 1000 03/08/17 1030  BP: (!) 125/53 (!) 125/53 (!) 135/57 (!) 146/62  Pulse: (!) 47 78 65 72  Resp: 14  16 (!) 25  Temp:      TempSrc:      SpO2: 95%  91% 98%  PainSc:        Isolation Precautions No active isolations  Medications Medications  cholecalciferol (VITAMIN D) tablet 1,000 Units (1,000 Units Oral Given 03/08/17 0955)  diltiazem (CARDIZEM CD) 24 hr capsule 180 mg (180 mg Oral Refused 03/08/17 0958)  furosemide (LASIX) tablet 20 mg (20 mg Oral Given 03/08/17 0955)  hydrochlorothiazide (MICROZIDE) capsule 12.5 mg (12.5 mg Oral Given 03/08/17 0955)  ipratropium-albuterol (DUONEB) 0.5-2.5 (3) MG/3ML nebulizer solution 3 mL (not administered)  latanoprost (XALATAN) 0.005 % ophthalmic solution 1 drop (not administered)  metoprolol succinate (TOPROL-XL) 24 hr tablet 25 mg (25 mg Oral Given 03/08/17 0955)  pantoprazole (PROTONIX) EC tablet 40 mg (40 mg Oral Given 03/08/17 0955)  potassium chloride (K-DUR) CR tablet 10 mEq (10 mEq Oral Given 03/08/17 0955)  rivaroxaban (XARELTO) tablet 20 mg (not administered)  insulin aspart (novoLOG) injection 0-9 Units (not administered)    Mobility walks with person  assist

## 2017-03-08 NOTE — ED Triage Notes (Signed)
Pt states tonight around 1am she started having pain in the middle of her chest and felt like her heart was going to fly away  Pt states she has a home monitor and her heart rate went above 158  Pt states she felt short of breath but always does  Pt is on home oxygen at 2 liters/min via [place]  Pt says her pain is constant and just hurts   Pt states she took Diltiazem 11m po about an hour and a half ago

## 2017-03-08 NOTE — Progress Notes (Signed)
PHARMACIST - PHYSICIAN ORDER COMMUNICATION  CONCERNING: P&T Medication Policy on Herbal Medications  DESCRIPTION:  This patient's order for:  lutein  has been noted.  This product(s) is classified as an "herbal" or natural product. Due to a lack of definitive safety studies or FDA approval, nonstandard manufacturing practices, plus the potential risk of unknown drug-drug interactions while on inpatient medications, the Pharmacy and Therapeutics Committee does not permit the use of "herbal" or natural products of this type within [facility].   ACTION TAKEN: The pharmacy department is unable to verify this order at this time and the order has been discontinued Please reevaluate patient's clinical condition at discharge and address if the herbal or natural product(s) should be resumed at that time.  Royetta Asal, PharmD, BCPS Pager [phone] 03/08/2017 9:15 AM

## 2017-03-08 NOTE — ED Notes (Signed)
Bed: LR37 Expected date:  Expected time:  Means of arrival:  Comments: Held for RES A

## 2017-03-09 DIAGNOSIS — R079 Chest pain, unspecified: Secondary | ICD-10-CM

## 2017-03-09 DIAGNOSIS — R002 Palpitations: Secondary | ICD-10-CM | POA: Diagnosis not present

## 2017-03-09 DIAGNOSIS — I4891 Unspecified atrial fibrillation: Secondary | ICD-10-CM

## 2017-03-09 DIAGNOSIS — I5032 Chronic diastolic (congestive) heart failure: Secondary | ICD-10-CM | POA: Diagnosis not present

## 2017-03-09 LAB — GLUCOSE, CAPILLARY: GLUCOSE-CAPILLARY: 144 mg/dL — AB (ref 65–99)

## 2017-03-09 LAB — URINE CULTURE: CULTURE: NO GROWTH

## 2017-03-09 NOTE — Discharge Summary (Addendum)
Physician Discharge Summary  Becky Newton NLZ:[id number] DOB: 1933/02/26 DOA: 03/08/2017  PCP: [name], NP  Admit date: 03/08/2017 Discharge date: 03/09/2017  Admitted From: home Disposition:  Home  Recommendations for Outpatient Follow-up:  1. Follow up with Cardiology in 1-2 weeks, will probably need a stress test as an outpatient. 2. Follow up with PCP , check .  Basic metabolic panel on Monday, 03/11/2017 when she follow-up with her PCP  Home Health:No Equipment/Devices:none  Discharge Condition:stable CODE STATUS:full Diet recommendation: Heart Healthy  Brief/Interim Summary: 84 y.o. female with PMH of HTN, dCHF, PAF (on xarelto), Left hemidiaphragm paralysis, COPD on home oxygen (2lpm), DM presented with palpitations and chest pressure. Patient states that's she wok up around 0.1.00AM with mild substernal, no radiating chest pressure sensations. She felt her heart rate racing and checked her heart rate with blood pressure monitor and was found to be around 150's. She was instructed to take extra diltiazem for palpitation. She took 120 mg of Cardizem and heart rate has improved.   Discharge Diagnoses:  Active Problems:   Chronic respiratory failure (HCC)   Chest pain   Morbid obesity (HCC)   Chronic obstructive pulmonary disease (HCC)   Atrial fibrillation with RVR (HCC)   Chronic diastolic CHF (congestive heart failure) (HCC)   Palpitation  Chest pressure/palpitation in the setting of atrial fibrillation with RVR: She has had no recurrent chest pain she is been a here in the hospital, initial EKG and cardiac biomarkers are unremarkable. 2D echo was unchanged. Is hard to evaluate her fluid status due to her body habitus.  She she admits that at time missed doses of her Lasix. Refused her Lasix dose on the day of discharge as she had a long drive home.  Paroxysmal atrial fibrillation with RVR: This resolved with extra doses of diltiazem that she took at home. She  was continued here on her current home regimen of Cardizem metoprolol and Xarelto and she had no events on telemetry.  Chronic diastolic heart failure: Cannot appreciate fluid overload sickle exam, none of her medications were changed, except for her Lasix which I have told her to take 40 mg twice a day for 1 days then go back to her regular dose of 20 mg twice daily.  Diabetes mellitus type 2: No changes were made to her medication.    Discharge Instructions  Discharge Instructions    Diet - low sodium heart healthy   Complete by:  As directed    Increase activity slowly   Complete by:  As directed      Allergies as of 03/09/2017      Reactions   Iodine Nausea And Vomiting   Ivp Dye [iodinated Diagnostic Agents] Nausea And Vomiting      Medication List    TAKE these medications   AMBULATORY NON FORMULARY MEDICATION Accu-Chek FastClix Lancets Drum. Use to check blood sugar twice a day. Type 2 diabetes.   AMBULATORY NON FORMULARY MEDICATION Accu-Chek SmartView glucose testing strips. Use to check blood sugar twice a day.  Type 2 diabetes.   benzonatate 200 MG capsule Commonly known as:  TESSALON Take 1 capsule (200 mg total) by mouth 3 (three) times daily as needed for cough.   budesonide-formoterol 160-4.5 MCG/ACT inhaler Commonly known as:  SYMBICORT Inhale 2 puffs into the lungs 2 (two) times daily.   CENTRUM SILVER ULTRA WOMENS PO Take 1 tablet by mouth daily.   PRESERVISION AREDS PO Take 1 capsule by mouth 2 (two) times daily.  cholecalciferol 1000 units tablet Commonly known as:  VITAMIN D Take 1,000 Units by mouth daily.   CVS LUTEIN 40 MG Caps Generic drug:  Lutein Take 1 capsule by mouth daily.   diltiazem 120 MG 24 hr capsule Commonly known as:  DILACOR XR Take 120 mg by mouth as needed (ATRIAL FIBRILATION).   diltiazem 180 MG 24 hr capsule Commonly known as:  CARDIZEM CD Take 1 capsule (180 mg total) by mouth daily.   furosemide 20 MG  tablet Commonly known as:  LASIX Take 1 tablet (20 mg total) by mouth 2 (two) times daily.   Glycopyrrolate-Formoterol 9-4.8 MCG/ACT Aero Commonly known as:  BEVESPI AEROSPHERE Inhale 2 puffs into the lungs 2 (two) times daily.   hydrochlorothiazide 12.5 MG capsule Commonly known as:  MICROZIDE TAKE 1 CAPSULE EVERY DAY   ipratropium-albuterol 0.5-2.5 (3) MG/3ML Soln Commonly known as:  DUONEB Take 3 mLs by nebulization every 4 (four) hours as needed. What changed:  reasons to take this   latanoprost 0.005 % ophthalmic solution Commonly known as:  XALATAN Place 1 drop into both eyes at bedtime.   metoprolol succinate 25 MG 24 hr tablet Commonly known as:  TOPROL-XL Take 1 tablet (25 mg total) by mouth daily.   omeprazole 20 MG capsule Commonly known as:  PRILOSEC Take 1 capsule (20 mg total) by mouth 2 (two) times daily before a meal.   ondansetron 4 MG tablet Commonly known as:  ZOFRAN Take 1 tablet (4 mg total) by mouth every 6 (six) hours.   OXYGEN Inhale 2 L into the lungs.   potassium chloride 10 MEQ tablet Commonly known as:  KLOR-CON 10 Take 1 tablet (10 mEq total) by mouth daily.   rivaroxaban 20 MG Tabs tablet Commonly known as:  XARELTO Take 1 tablet (20 mg total) by mouth daily with supper.   VENTOLIN HFA 108 (90 Base) MCG/ACT inhaler Generic drug:  albuterol INHALE TWO PUFFS EVERY 4-6 HOURS ONLY AS NEEDED FOR SHORTNESS OF BREATH OR WHEEZING.      Follow-up Information    Nafziger, Tommi Rumps, NP Follow up.   Specialty:  Family Medicine Contact information: [place] [address] [phone]          Allergies  Allergen Reactions  . Iodine Nausea And Vomiting  . Ivp Dye [Iodinated Diagnostic Agents] Nausea And Vomiting    Consultations:  None   Procedures/Studies: Dg Chest 2 View  Result Date: 03/08/2017 CLINICAL DATA:  Awoke from sleep with chest pain and shortness of breath. EXAM: CHEST  2 VIEW COMPARISON:   06/20/2016 FINDINGS: Stable cardiomegaly. Unchanged mediastinal contours with aortic atherosclerosis. Chronic blunting of both costophrenic angles, likely scarring. Mild vascular congestion. No confluent airspace disease. No pneumothorax. No acute osseous abnormalities. Surgical clips in the region of the gastroesophageal junction. IMPRESSION: Chronic cardiomegaly and basilar scarring. Mild vascular congestion. Electronically Signed   By: Jeb Levering M.D.   On: 03/08/2017 05:34     Subjective: She relates she feels better, she denies any PND or any dyspnea on exertion.  Discharge Exam: Vitals:   03/09/17 0414 03/09/17 0839  BP: 135/62 134/68  Pulse: 67 75  Resp: 18   Temp: 98.3 F (36.8 C)   SpO2: 96%    Vitals:   03/08/17 1543 03/08/17 2155 03/09/17 0414 03/09/17 0839  BP:  133/69 135/62 134/68  Pulse:  70 67 75  Resp:  18 18   Temp:  97.7 F (36.5 C) 98.3 F (36.8 C)  TempSrc:  Oral Oral   SpO2: 97% 98% 96%     General: Pt is alert, awake, not in acute distress Cardiovascular: RRR, S1/S2 +, no rubs, no gallops Respiratory: CTA bilaterally, no wheezing, no rhonchi Abdominal: Soft, NT, ND, bowel sounds + Extremities: no edema, no cyanosis    The results of significant diagnostics from this hospitalization (including imaging, microbiology, ancillary and laboratory) are listed below for reference.     Microbiology: No results found for this or any previous visit (from the past 240 hour(s)).   Labs: BNP (last 3 results) No results for input(s): BNP in the last 8760 hours. Basic Metabolic Panel: Recent Labs  Lab 03/08/17 0450 03/08/17 0457  NA 141  --   K 3.9  --   CL 100*  --   CO2 30  --   GLUCOSE 142*  --   BUN 16  --   CREATININE 0.93  --   CALCIUM 9.4  --   MG  --  1.7   Liver Function Tests: Recent Labs  Lab 03/08/17 0457  AST 19  ALT 12*  ALKPHOS 70  BILITOT 0.3  PROT 6.5  ALBUMIN 3.4*   No results for input(s): LIPASE, AMYLASE in the  last 168 hours. No results for input(s): AMMONIA in the last 168 hours. CBC: Recent Labs  Lab 03/08/17 0450  WBC 8.8  HGB 13.0  HCT 40.6  MCV 96.7  PLT 223   Cardiac Enzymes: Recent Labs  Lab 03/08/17 0855 03/08/17 1420 03/08/17 2019  TROPONINI <0.03 <0.03 <0.03   BNP: Invalid input(s): POCBNP CBG: Recent Labs  Lab 03/08/17 1158 03/08/17 1649 03/08/17 2257 03/09/17 0745  GLUCAP 128* 145* 137* 144*   D-Dimer No results for input(s): DDIMER in the last 72 hours. Hgb A1c No results for input(s): HGBA1C in the last 72 hours. Lipid Profile No results for input(s): CHOL, HDL, LDLCALC, TRIG, CHOLHDL, LDLDIRECT in the last 72 hours. Thyroid function studies No results for input(s): TSH, T4TOTAL, T3FREE, THYROIDAB in the last 72 hours.  Invalid input(s): FREET3 Anemia work up No results for input(s): VITAMINB12, FOLATE, FERRITIN, TIBC, IRON, RETICCTPCT in the last 72 hours. Urinalysis    Component Value Date/Time   COLORURINE STRAW (A) 03/08/2017 0641   APPEARANCEUR CLEAR 03/08/2017 0641   LABSPEC 1.005 03/08/2017 0641   PHURINE 6.0 03/08/2017 0641   [place] 03/08/2017 0641   HGBUR NEGATIVE 03/08/2017 0641   BILIRUBINUR NEGATIVE 03/08/2017 [place] 03/08/2017 0641   PROTEINUR NEGATIVE 03/08/2017 0641   NITRITE NEGATIVE 03/08/2017 0641   LEUKOCYTESUR NEGATIVE 03/08/2017 0641   Sepsis Labs Invalid input(s): PROCALCITONIN,  WBC,  LACTICIDVEN Microbiology No results found for this or any previous visit (from the past 240 hour(s)).   Time coordinating discharge: Over 30 minutes  SIGNED:   Charlynne Cousins, MD  [facility] 03/09/2017, 9:31 AM Pager   If 7PM-7AM, please contact night-coverage [email or web address] Password [email or web address]

## 2017-03-09 NOTE — Discharge Instructions (Signed)
Take 40 mg of Lasix twice a day for 1 days then go back to 20 mg daily twice a day.

## 2017-03-11 ENCOUNTER — Telehealth: Payer: Self-pay | Admitting: Family Medicine

## 2017-03-11 IMAGING — DX DG CHEST 2V
2 series · 2 of 2 positions shown · non-contrast
Comparison: None.

CLINICAL DATA: Shortness of breath.  Diaphragm paralysis.

EXAM:
CHEST  2 VIEW

[chest pa]
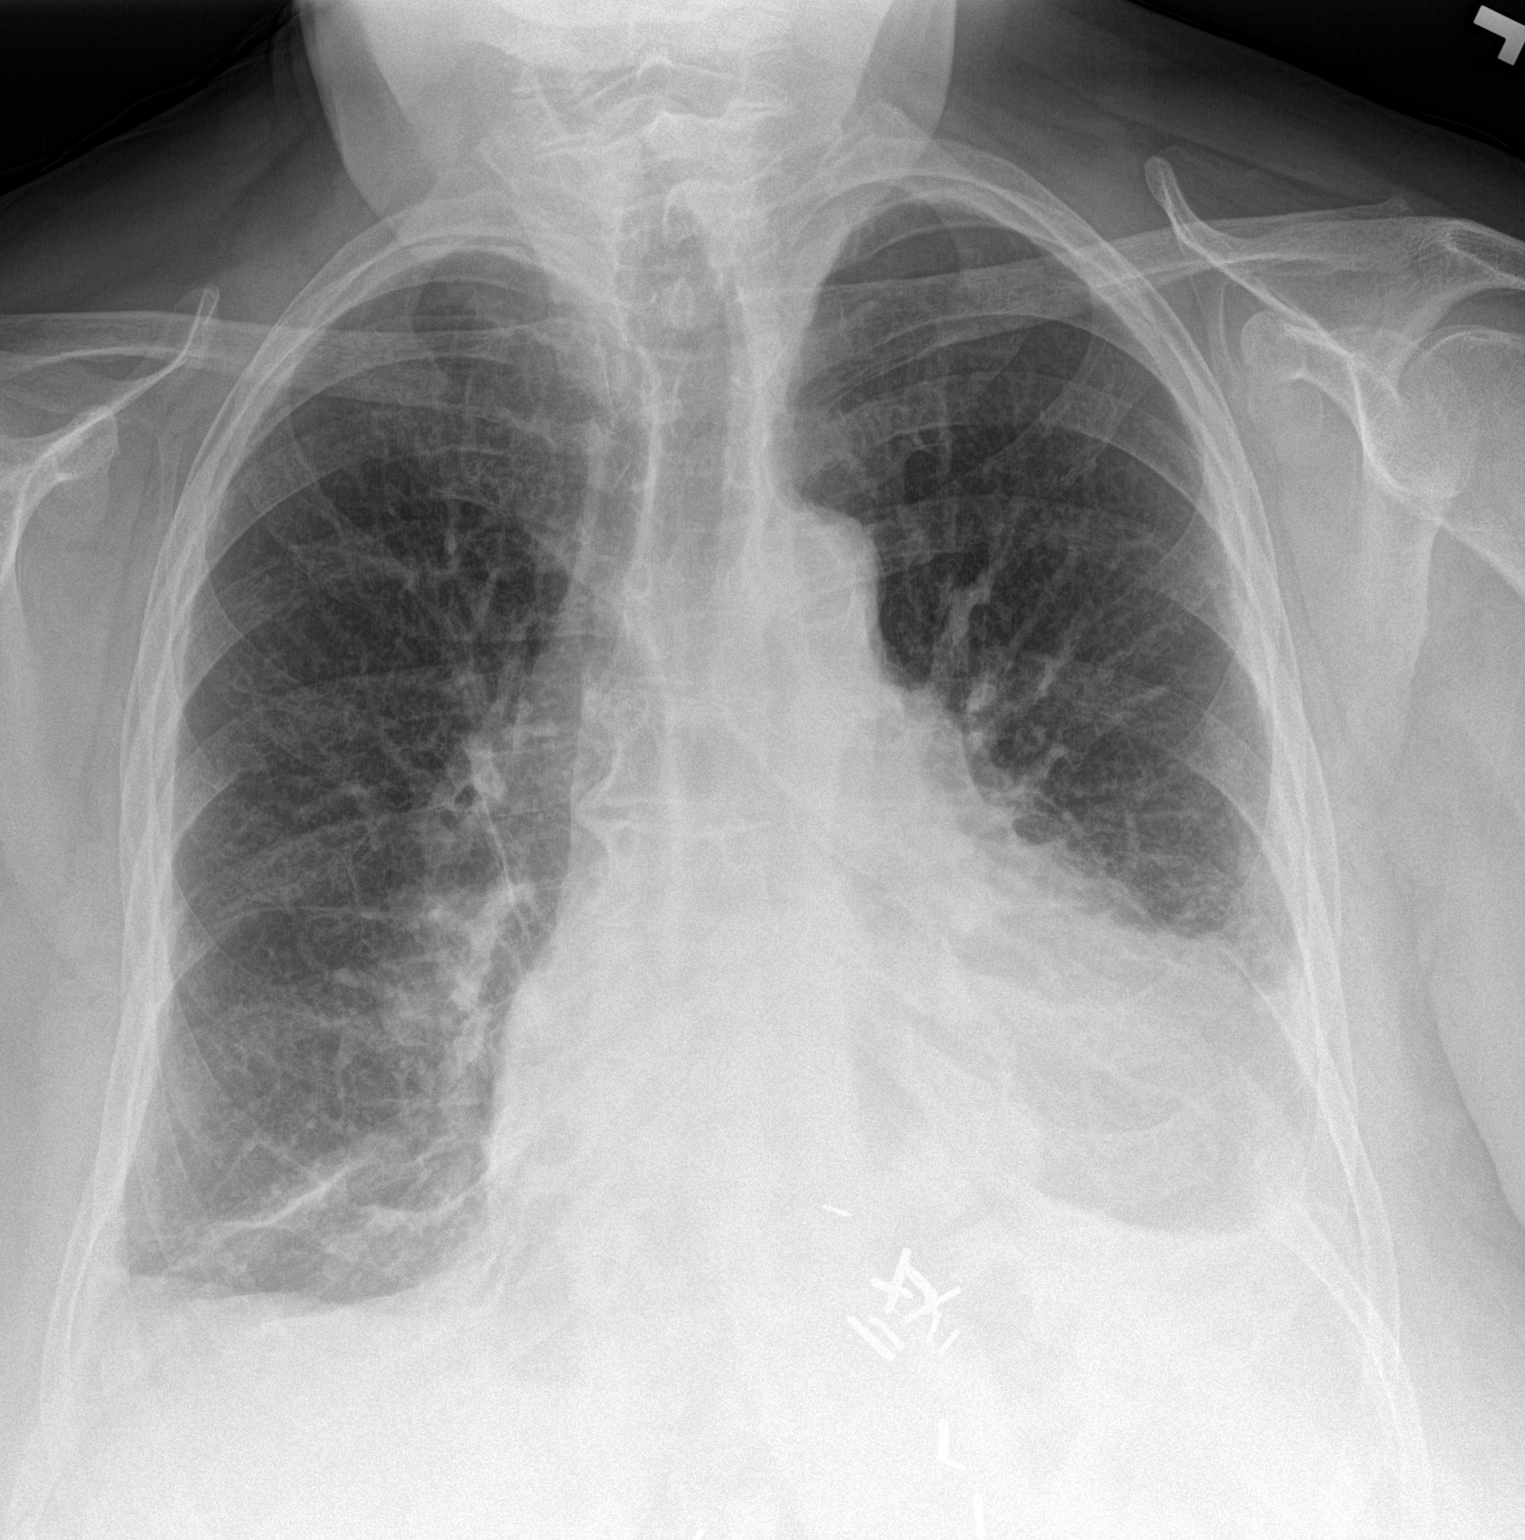

[chest lat]
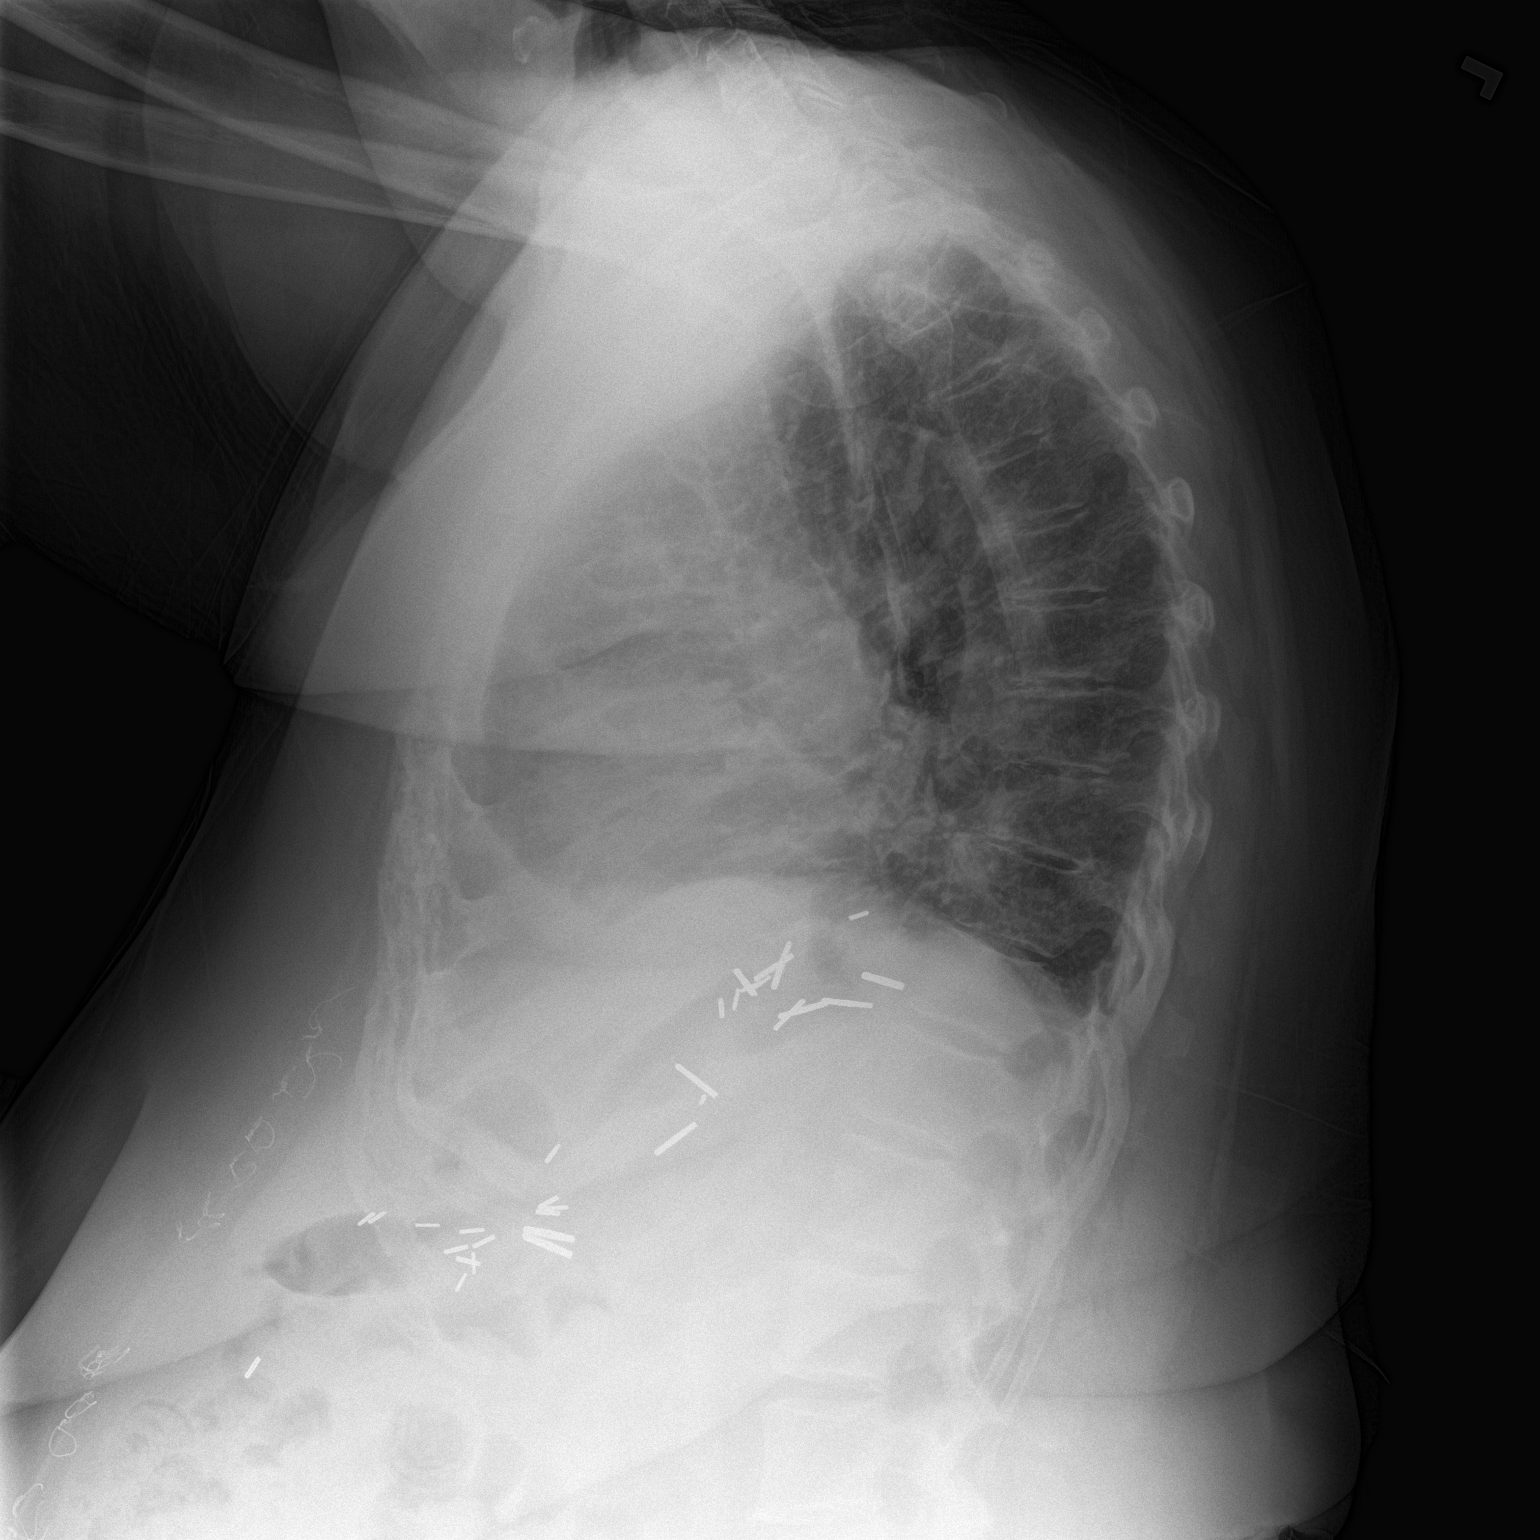

[2 of 2 positions shown; findings below may reference images not displayed]

FINDINGS: There is moderate cardiac enlargement. Aortic atherosclerosis. Small
bilateral pleural effusions identified. There is scar versus
atelectasis noted in the right base.
IMPRESSION: 1. Cardiac enlargement and small bilateral pleural effusions.
2. Scar versus atelectasis in the right base.

## 2017-03-11 NOTE — Telephone Encounter (Signed)
Transition Care Management Follow-up Telephone Call  Physician Discharge Summary  Becky Newton [id number]:[id number] DOB: 12/10/1933 DOA: 03/08/2017  PCP: [name], NP  Admit date: 03/08/2017 Discharge date: 03/09/2017  Admitted From: home Disposition:  Home  Recommendations for Outpatient Follow-up:  1. Follow up with Cardiology in 1-2 weeks, will probably need a stress test as an outpatient. 2. Follow up with PCP , check .  Basic metabolic panel on Monday, 03/11/2017 when she follow-up with her PCP  Home Health:No Equipment/Devices:none  Discharge Condition:stable CODE STATUS:full Diet recommendation: Heart Healthy  Brief/Interim Summary: 84 y.o.femalewith PMH of HTN, dCHF, PAF (on xarelto), Left hemidiaphragm paralysis, COPD on home oxygen (2lpm), DM presented with palpitations and chest pressure. Patient states that's she wok up around 0.1.00AM with mild substernal, no radiating chest pressure sensations. She felt her heart rate racing and checked her heart rate with blood pressure monitor and was found to be around 150's. She was instructed to take extra diltiazem for palpitation. She took 120 mg of Cardizem and heart rate has improved.    How have you been since you were released from the hospital? "better"   Do you understand why you were in the hospital? yes   Do you understand the discharge instructions? yes   Where were you discharged to? Home   Items Reviewed:  Medications reviewed: yes  Allergies reviewed: yes  Dietary changes reviewed: yes  Referrals reviewed: yes   Functional Questionnaire:   Activities of Daily Living (ADLs):   She states they are independent in the following: ambulation, bathing and hygiene, feeding, continence, grooming, toileting and dressing States they require assistance with the following: none   Any transportation issues/concerns?: no   Any patient concerns? no   Confirmed importance and date/time of  follow-up visits scheduled yes  Provider Appointment booked with [name] on 03/13/2017 Wednesday at 3:00 pm  Confirmed with patient if condition begins to worsen call PCP or go to the ER.  Patient was given the office number and encouraged to call back with question or concerns.  : yes

## 2017-03-11 NOTE — Telephone Encounter (Signed)
I was on the phone going over medications and our phone line went out, will try again later on.

## 2017-03-13 ENCOUNTER — Encounter: Payer: Self-pay | Admitting: Adult Health

## 2017-03-13 ENCOUNTER — Ambulatory Visit (INDEPENDENT_AMBULATORY_CARE_PROVIDER_SITE_OTHER): Payer: Medicare HMO | Admitting: Adult Health

## 2017-03-13 VITALS — BP 118/72 | HR 50 | Temp 97.6°F

## 2017-03-13 DIAGNOSIS — R079 Chest pain, unspecified: Secondary | ICD-10-CM | POA: Diagnosis not present

## 2017-03-13 DIAGNOSIS — I4891 Unspecified atrial fibrillation: Secondary | ICD-10-CM

## 2017-03-13 DIAGNOSIS — I5032 Chronic diastolic (congestive) heart failure: Secondary | ICD-10-CM

## 2017-03-13 LAB — BASIC METABOLIC PANEL
BUN: 28 mg/dL — ABNORMAL HIGH (ref 6–23)
CALCIUM: 9.9 mg/dL (ref 8.4–10.5)
CHLORIDE: 99 meq/L (ref 96–112)
CO2: 39 meq/L — AB (ref 19–32)
CREATININE: 1.09 mg/dL (ref 0.40–1.20)
GFR: 51.17 mL/min — ABNORMAL LOW (ref >60.00)
GLUCOSE: 125 mg/dL — AB (ref 70–99)
Potassium: 3.7 mEq/L (ref 3.5–5.1)
Sodium: 143 mEq/L (ref 135–145)

## 2017-03-13 NOTE — Progress Notes (Signed)
Subjective:    Patient ID: Becky Newton, female    DOB: [birth year], 84 y.o.   MRN: [id number]  HPI  84 year old female who  has a past medical history of A-fib ([place]), Atrial fibrillation ([place]), Chicken pox, COPD (chronic obstructive pulmonary disease) ([place]), Diabetes ([place]), Fibrocystic breast determined by biopsy (1977), GERD (gastroesophageal reflux disease), Glaucoma, H/O hernia repair (2006), H/O left breast biopsy (1982), Incisional hernia, Lung disease, S/P scar revision, and Uterine cancer ([place]).   She presents to the office today for TCM visit  She was admitted on 03/08/2017 She was discharged on 03/09/2017  She presented to the emergency room with the complaint of palpitations and chest pressure.  She stated that she woke up around 1 AM with mild substernal, nonradiating pressure sensation.  She felt her heart rate racing and checked her heart rate with a blood pressure monitor was found to be in the 150s.  She was instructed to take an extra dose of Dital exam for palpitations.  She took 120 mg of Dital exam and her heart rate had improved presentation.  She had no recurrent chest pain while in the hospital, initial EKG and cardiac markers were unremarkable.  2D echo was done which was unchanged.  She did admit that at times she misses her Lasix dose  She was advised to continue with her current home regimen of Cardizem, metoprolol, and Xarelto.  She had no events on telemetry while in the hospital  Due to chronic diastolic heart failure upon discharge she was told to take 40 mg of Lasix twice a day for 1 day and then go back to her regular dose of 20 mg twice daily  Upon discharge she was advised to follow-up with her CP for repeat BMP and also follow-up with cardiology, she sees Dr. [name] for possible repeat stress test.   Today in the office she reports that she has had no chest pain or palpations since she was discharged. She feels as though she is back to baseline. Denies  any acute complaints.     Review of Systems  Constitutional: Negative.   Respiratory: Positive for shortness of breath (chronic ).   Cardiovascular: Negative.   Genitourinary: Negative.   Musculoskeletal: Positive for arthralgias, back pain and gait problem.  Skin: Negative.   All other systems reviewed and are negative.  Past Medical History:  Diagnosis Date  . A-fib ([place])   . Atrial fibrillation ([place])   . Chicken pox   . COPD (chronic obstructive pulmonary disease) ([place])   . Diabetes ([place])   . Fibrocystic breast determined by biopsy 1977  . GERD (gastroesophageal reflux disease)   . Glaucoma   . H/O hernia repair 2006  . H/O left breast biopsy 1982  . Incisional hernia   . Lung disease    Paralyzed left hemidiaphragm  . S/P scar revision   . Uterine cancer [facility])     Social History   Socioeconomic History  . Marital status: Married    Spouse name: Not on file  . Number of children: Not on file  . Years of education: Not on file  . Highest education level: Not on file  Social Needs  . Financial resource strain: Not on file  . Food insecurity - worry: Not on file  . Food insecurity - inability: Not on file  . Transportation needs - medical: Not on file  . Transportation needs - non-medical: Not on file  Occupational History  . Not  on file  Tobacco Use  . Smoking status: Former Smoker    Packs/day: 0.50    Years: 20.00    Pack years: 10.00    Types: Cigarettes    Last attempt to quit: 08/23/1989    Years since quitting: 27.5  . Smokeless tobacco: Never Used  Substance and Sexual Activity  . Alcohol use: No    Alcohol/week: 0.0 oz  . Drug use: No  . Sexual activity: No    Partners: Male  Other Topics Concern  . Not on file  Social History Narrative  . Not on file    Past Surgical History:  Procedure Laterality Date  . APPENDECTOMY  1966  . BREAST BIOPSY    . CATARACT EXTRACTION  2004,2005  . CHOLECYSTECTOMY  1975  . HERNIA REPAIR  2006  . HIATAL  HERNIA REPAIR  1966  . NISSEN FUNDOPLICATION    . TOTAL ABDOMINAL HYSTERECTOMY W/ BILATERAL SALPINGOOPHORECTOMY  1974   hx of cancer     Family History  Problem Relation Age of Onset  . Breast cancer Mother   . Arthritis Mother   . Stroke Mother   . Heart attack Mother   . Heart disease Mother   . Alcohol abuse Brother     Allergies  Allergen Reactions  . Iodine Nausea And Vomiting  . Ivp Dye [Iodinated Diagnostic Agents] Nausea And Vomiting    Current Outpatient Medications on File Prior to Visit  Medication Sig Dispense Refill  . benzonatate (TESSALON) 200 MG capsule Take 1 capsule (200 mg total) by mouth 3 (three) times daily as needed for cough. 20 capsule 2  . cholecalciferol (VITAMIN D) 1000 units tablet Take 1,000 Units by mouth daily.    Marland Kitchen diltiazem (CARDIZEM CD) 180 MG 24 hr capsule Take 1 capsule (180 mg total) by mouth daily. 90 capsule 3  . furosemide (LASIX) 20 MG tablet Take 1 tablet (20 mg total) by mouth 2 (two) times daily. (Patient taking differently: Take 20 mg by mouth daily. ) 180 tablet 1  . hydrochlorothiazide (MICROZIDE) 12.5 MG capsule TAKE 1 CAPSULE EVERY DAY 90 capsule 1  . ipratropium-albuterol (DUONEB) 0.5-2.5 (3) MG/3ML SOLN Take 3 mLs by nebulization every 4 (four) hours as needed. 360 mL 3  . latanoprost (XALATAN) 0.005 % ophthalmic solution Place 1 drop into both eyes at bedtime.    . Lutein (CVS LUTEIN) 40 MG CAPS Take 1 capsule by mouth daily.    . metFORMIN (GLUCOPHAGE) 500 MG tablet Take 500 mg by mouth 2 (two) times daily with a meal.    . metoprolol succinate (TOPROL-XL) 25 MG 24 hr tablet Take 1 tablet (25 mg total) by mouth daily. 14 tablet 0  . Multiple Vitamins-Minerals (PRESERVISION AREDS PO) Take 1 capsule by mouth 2 (two) times daily.     Marland Kitchen omeprazole (PRILOSEC) 20 MG capsule Take 1 capsule (20 mg total) by mouth 2 (two) times daily before a meal. 180 capsule 3  . ondansetron (ZOFRAN) 4 MG tablet Take 1 tablet (4 mg total) by mouth  every 6 (six) hours. 12 tablet 0  . OXYGEN Inhale 2 L into the lungs.    . potassium chloride (KLOR-CON 10) 10 MEQ tablet Take 1 tablet (10 mEq total) by mouth daily. 90 tablet 1  . rivaroxaban (XARELTO) 20 MG TABS tablet Take 1 tablet (20 mg total) by mouth daily with supper. 90 tablet 3  . VENTOLIN HFA 108 (90 Base) MCG/ACT inhaler INHALE TWO PUFFS EVERY 4-6 HOURS  ONLY AS NEEDED FOR SHORTNESS OF BREATH OR WHEEZING.  4  . AMBULATORY NON FORMULARY MEDICATION Accu-Chek FastClix Lancets Drum. Use to check blood sugar twice a day. Type 2 diabetes. 5 Units 11  . AMBULATORY NON FORMULARY MEDICATION Accu-Chek SmartView glucose testing strips. Use to check blood sugar twice a day.  Type 2 diabetes. 100 Units 11   No current facility-administered medications on file prior to visit.     BP 118/72 (BP Location: Right Wrist)   Pulse (!) 50   Temp 97.6 F (36.4 C) (Oral)   SpO2 95%       Objective:   Physical Exam  Constitutional: She is oriented to person, place, and time. She appears well-developed and well-nourished. No distress.  Eyes: Conjunctivae and EOM are normal. Pupils are equal, round, and reactive to light. Right eye exhibits no discharge. Left eye exhibits no discharge. No scleral icterus.  Neck: Normal range of motion. Neck supple.  Cardiovascular: Normal rate, regular rhythm, normal heart sounds and intact distal pulses. Exam reveals no gallop and no friction rub.  No murmur heard. Pulmonary/Chest: Effort normal and breath sounds normal. No respiratory distress. She has no wheezes. She has no rales. She exhibits no tenderness.  Continuous 02 via [place]   Musculoskeletal: She exhibits no edema, tenderness or deformity.  In motorized wheel chair   Lymphadenopathy:    She has no cervical adenopathy.  Neurological: She is alert and oriented to person, place, and time.  Skin: Skin is warm and dry. No rash noted. She is not diaphoretic. No erythema. No pallor.  Psychiatric: She has a  normal mood and affect. Her behavior is normal. Judgment and thought content normal.  Nursing note and vitals reviewed.     Assessment & Plan:  1. Atrial fibrillation with RVR ([place]) - Reviewed lab work and testing that was done during hospital admission with patient and her husband. All questions answered  - Normal sinus today  - Advised to follow up with  Dr. [name] - Continue current medication regimen  - Basic Metabolic Panel - Return precautions reviewed  -  2. Chronic diastolic CHF (congestive heart failure) (HCC) - Does not appear fluid overloaded at this time.  - Continue with lasix 20 mg BID  - Follow up as needed - Basic Metabolic Panel   [name], NP

## 2017-03-14 ENCOUNTER — Encounter: Payer: Self-pay | Admitting: Adult Health

## 2017-03-19 ENCOUNTER — Other Ambulatory Visit: Payer: Self-pay | Admitting: Family Medicine

## 2017-03-19 DIAGNOSIS — H01009 Unspecified blepharitis unspecified eye, unspecified eyelid: Secondary | ICD-10-CM

## 2017-03-27 ENCOUNTER — Encounter: Payer: Self-pay | Admitting: Physician Assistant

## 2017-03-27 ENCOUNTER — Ambulatory Visit: Payer: Medicare HMO | Admitting: Physician Assistant

## 2017-03-27 VITALS — BP 90/70 | HR 72 | Ht 61.5 in | Wt 226.0 lb

## 2017-03-27 DIAGNOSIS — I4891 Unspecified atrial fibrillation: Secondary | ICD-10-CM

## 2017-03-27 DIAGNOSIS — R079 Chest pain, unspecified: Secondary | ICD-10-CM | POA: Diagnosis not present

## 2017-03-27 DIAGNOSIS — I5032 Chronic diastolic (congestive) heart failure: Secondary | ICD-10-CM

## 2017-03-27 DIAGNOSIS — E119 Type 2 diabetes mellitus without complications: Secondary | ICD-10-CM | POA: Diagnosis not present

## 2017-03-27 DIAGNOSIS — J449 Chronic obstructive pulmonary disease, unspecified: Secondary | ICD-10-CM | POA: Diagnosis not present

## 2017-03-27 NOTE — Patient Instructions (Signed)
Schedule Lexiscan   No medication to hold    Schedule 24 hour holter monitor    Your physician recommends that you schedule a follow-up appointment in: 2 months with Dr.Crenshaw

## 2017-03-27 NOTE — Progress Notes (Signed)
Cardiology Office Note    Date:  03/29/2017   ID:  [name], DOB [birth year], MRN [id number]  PCP:  [name], NP  Cardiologist:  Dr. [name] (last visit 11/03/2014)   Chief Complaint  Patient presents with  . Follow-up    seen for Dr. [name]    History of Present Illness:  TIAH Becky Newton is a 84 y.o. female with PMH of atrial fibrillation, DM 2, GERD, COPD on 2 L pm, paralyzed left hemidiaphragm, and history of uterine cancer.  Patient was last seen by Dr. [name] in October 2016.  Prior to that she was admitted to [facility] with new onset of atrial fibrillation with RVR.  She converted to sinus rhythm with Cardizem.  She was ultimately discharged on Toprol-XL and Xarelto.  TSH was normal.  Echocardiogram E Plessis obtained in September 2016 showed normal LV function, grade 2 DD, moderate LAE, mild tricuspid regurgitation and trace aortic insufficiency.  Chest x-ray obtained in September 2016 showed probable edema and mild effusion.  During her last office visit she was doing very well, although she did have chest pain during her atrial fibrillation.  Dr. [name] recommended outpatient stress test to rule out ischemia.  She was readmitted in February 2017 with recurrent atrial fibrillation.  Since 2016, she has failed to follow-up with cardiology service.  She was most recently admitted on 03/08/2017 with chest pain.  Her heart rate was tachycardic prior to arrival with symptomatic improvement.  EKG obtained in the ED showed that she has already converted to sinus rhythm.  She again had a chest pressure in the setting of atrial fibrillation with RVR.  Echocardiogram obtained on 03/08/2017 showed EF 60-65%, mild LAE, peak PA pressure 88 mmHg.  She has been doing well since her recent discharge.  She is on 24/7 home O2.  Most of the time she used 2 L, occasionally uses 3 L.  She did have chest pressure during the atrial fibrillation, however none since.  I  recommended a Lexiscan Myoview in this case.  She did not have any obvious wheezing on physical exam.  Her initial blood pressure was low, however blood pressure obtained on her wrist later was 110/60.  I am hesitant to increase her long-term diltiazem especially since the atrial fibrillation seems to be quite rare and she has not had a recurrence of atrial fibrillation since February 2017 until recently.  On the most recent office visit with her PCP, her heart rate was noted to be 50 at the time.  I recommended a 24-hour Holter monitor to assess her heart rate variation throughout the day.  If she does not have significant bradycardia on the 24-hour Holter monitor, I think it would be reasonable to increase her diltiazem to 240 mg at some point in the future to suppress recurrence of atrial fibrillation.    Past Medical History:  Diagnosis Date  . A-fib ([place])   . Atrial fibrillation ([place])   . Chicken pox   . COPD (chronic obstructive pulmonary disease) ([place])   . Diabetes ([place])   . Fibrocystic breast determined by biopsy 1977  . GERD (gastroesophageal reflux disease)   . Glaucoma   . H/O hernia repair 2006  . H/O left breast biopsy 1982  . Incisional hernia   . Lung disease    Paralyzed left hemidiaphragm  . S/P scar revision   . Uterine cancer [facility])     Past Surgical History:  Procedure Laterality Date  . APPENDECTOMY  1966  . BREAST BIOPSY    . CATARACT EXTRACTION  2004,2005  . CHOLECYSTECTOMY  1975  . HERNIA REPAIR  2006  . HIATAL HERNIA REPAIR  1966  . NISSEN FUNDOPLICATION    . TOTAL ABDOMINAL HYSTERECTOMY W/ BILATERAL SALPINGOOPHORECTOMY  1974   hx of cancer     Current Medications: Outpatient Medications Prior to Visit  Medication Sig Dispense Refill  . AMBULATORY NON FORMULARY MEDICATION Accu-Chek FastClix Lancets Drum. Use to check blood sugar twice a day. Type 2 diabetes. 5 Units 11  . AMBULATORY NON FORMULARY MEDICATION Accu-Chek SmartView glucose testing strips.  Use to check blood sugar twice a day.  Type 2 diabetes. 100 Units 11  . benzonatate (TESSALON) 200 MG capsule Take 1 capsule (200 mg total) by mouth 3 (three) times daily as needed for cough. 20 capsule 2  . cholecalciferol (VITAMIN D) 1000 units tablet Take 1,000 Units by mouth daily.    Marland Kitchen diltiazem (CARDIZEM CD) 180 MG 24 hr capsule Take 1 capsule (180 mg total) by mouth daily. 90 capsule 3  . furosemide (LASIX) 20 MG tablet Take 1 tablet (20 mg total) by mouth 2 (two) times daily. (Patient taking differently: Take 20 mg by mouth daily. ) 180 tablet 1  . hydrochlorothiazide (MICROZIDE) 12.5 MG capsule TAKE 1 CAPSULE EVERY DAY 90 capsule 1  . ipratropium-albuterol (DUONEB) 0.5-2.5 (3) MG/3ML SOLN Take 3 mLs by nebulization every 4 (four) hours as needed. 360 mL 3  . latanoprost (XALATAN) 0.005 % ophthalmic solution Place 1 drop into both eyes at bedtime.    . Lutein ([employer] LUTEIN) 40 MG CAPS Take 1 capsule by mouth daily.    . metFORMIN (GLUCOPHAGE) 500 MG tablet Take 500 mg by mouth 2 (two) times daily with a meal.    . metoprolol succinate (TOPROL-XL) 25 MG 24 hr tablet Take 1 tablet (25 mg total) by mouth daily. 14 tablet 0  . Multiple Vitamins-Minerals (PRESERVISION AREDS PO) Take 1 capsule by mouth 2 (two) times daily.     Marland Kitchen omeprazole (PRILOSEC) 20 MG capsule Take 1 capsule (20 mg total) by mouth 2 (two) times daily before a meal. 180 capsule 3  . ondansetron (ZOFRAN) 4 MG tablet Take 1 tablet (4 mg total) by mouth every 6 (six) hours. 12 tablet 0  . OXYGEN Inhale 2 L into the lungs.    . potassium chloride (KLOR-CON 10) 10 MEQ tablet Take 1 tablet (10 mEq total) by mouth daily. 90 tablet 1  . rivaroxaban (XARELTO) 20 MG TABS tablet Take 1 tablet (20 mg total) by mouth daily with supper. 90 tablet 3  . VENTOLIN HFA 108 (90 Base) MCG/ACT inhaler INHALE TWO PUFFS EVERY 4-6 HOURS ONLY AS NEEDED FOR SHORTNESS OF BREATH OR WHEEZING.  4   No facility-administered medications prior to visit.       Allergies:   Iodine and Ivp dye [iodinated diagnostic agents]   Social History   Socioeconomic History  . Marital status: Married    Spouse name: None  . Number of children: None  . Years of education: None  . Highest education level: None  Social Needs  . Financial resource strain: None  . Food insecurity - worry: None  . Food insecurity - inability: None  . Transportation needs - medical: None  . Transportation needs - non-medical: None  Occupational History  . None  Tobacco Use  . Smoking status: Former Smoker    Packs/day: 0.50    Years: 20.00  Pack years: 10.00    Types: Cigarettes    Last attempt to quit: 08/23/1989    Years since quitting: 27.6  . Smokeless tobacco: Never Used  Substance and Sexual Activity  . Alcohol use: No    Alcohol/week: 0.0 oz  . Drug use: No  . Sexual activity: No    Partners: Male  Other Topics Concern  . None  Social History Narrative  . None     Family History:  The patient's family history includes Alcohol abuse in her brother; Arthritis in her mother; Breast cancer in her mother; Heart attack in her mother; Heart disease in her mother; Stroke in her mother.   ROS:   Please see the history of present illness.    ROS All other systems reviewed and are negative.   PHYSICAL EXAM:   VS:  BP 90/70   Pulse 72   Ht 5' 1.5" (1.562 m)   Wt 226 lb (102.5 kg)   BMI 42.01 kg/m    GEN: sitting in wheelchair, on home O2 HEENT: normal  Neck: no JVD, carotid bruits, or masses Cardiac: RRR; no murmurs, rubs, or gallops,no edema  Respiratory:  clear to auscultation bilaterally, normal work of breathing GI: soft, nontender, nondistended, + BS MS: no deformity or atrophy  Skin: warm and dry, no rash Neuro:  Alert and Oriented x 3, Strength and sensation are intact Psych: euthymic mood, full affect  Wt Readings from Last 3 Encounters:  03/27/17 226 lb (102.5 kg)  12/27/16 200 lb (90.7 kg)  10/10/16 219 lb (99.3 kg)       Studies/Labs Reviewed:   EKG:  EKG is not ordered today.    Recent Labs: 03/08/2017: ALT 12; Hemoglobin 13.0; Magnesium 1.7; Platelets 223 03/13/2017: BUN 28; Creatinine, Ser 1.09; Potassium 3.7; Sodium 143   Lipid Panel    Component Value Date/Time   CHOL 164 07/08/2014   TRIG 162 (A) 07/08/2014   HDL 43 07/08/2014   LDLCALC 89 07/08/2014    Additional studies/ records that were reviewed today include:   Echo 03/08/2016 LV EF: 60% -   65%  Study Conclusions  - Left ventricle: The cavity size was normal. Wall thickness was   normal. Systolic function was normal. The estimated ejection   fraction was in the range of 60% to 65%. Wall motion was normal;   there were no regional wall motion abnormalities. - Aortic valve: There was mild regurgitation. - Left atrium: The atrium was mildly dilated. - Right atrium: The atrium was mildly dilated. - Atrial septum: A patent foramen ovale cannot be excluded. - Pulmonary arteries: PA peak pressure: 88 mm Hg (S).    ASSESSMENT:    1. Atrial fibrillation with RVR ([place])   2. Chronic diastolic CHF (congestive heart failure) ([place])   3. Chronic obstructive pulmonary disease, unspecified COPD type ([place])   4. Controlled type 2 diabetes mellitus without complication, without long-term current use of insulin ([facility])   5. Chest pain, unspecified type      PLAN:  In order of problems listed above:  1. Paroxysmal atrial fibrillation: She has not had any recurrent chest pain since February 2017.  On Xarelto. CHA2DS2-Vasc score 5 (HF, DM II, female, age)  2. Chest pain: Tend to occur more often during atrial fibrillation.  I recommended outpatient Lexiscan Myoview  3. Chronic diastolic heart failure: Appears to be euvolemic on physical exam.    4. DM 2: Managed by primary care provider  5. COPD on O2:  On home O2 24/7.    Medication Adjustments/Labs and Tests Ordered: Current medicines are reviewed at length with the patient today.   Concerns regarding medicines are outlined above.  Medication changes, Labs and Tests ordered today are listed in the Patient Instructions below. Patient Instructions  Schedule Lexiscan   No medication to hold    Schedule 24 hour holter monitor    Your physician recommends that you schedule a follow-up appointment in: 2 months with Dr.Crenshaw     Signed, Almyra Deforest, [place]  03/29/2017 9:35 AM    [address], [place], [place]  [address] Phone: [phone]; Fax: [phone]

## 2017-03-29 ENCOUNTER — Encounter: Payer: Self-pay | Admitting: Physician Assistant

## 2017-04-03 DIAGNOSIS — J449 Chronic obstructive pulmonary disease, unspecified: Secondary | ICD-10-CM | POA: Diagnosis not present

## 2017-04-11 ENCOUNTER — Ambulatory Visit (INDEPENDENT_AMBULATORY_CARE_PROVIDER_SITE_OTHER): Payer: Medicare HMO

## 2017-04-11 DIAGNOSIS — I4891 Unspecified atrial fibrillation: Secondary | ICD-10-CM | POA: Diagnosis not present

## 2017-04-11 DIAGNOSIS — I5032 Chronic diastolic (congestive) heart failure: Secondary | ICD-10-CM

## 2017-04-12 DIAGNOSIS — H02423 Myogenic ptosis of bilateral eyelids: Secondary | ICD-10-CM | POA: Diagnosis not present

## 2017-04-12 DIAGNOSIS — Z01818 Encounter for other preprocedural examination: Secondary | ICD-10-CM | POA: Diagnosis not present

## 2017-04-12 DIAGNOSIS — H02413 Mechanical ptosis of bilateral eyelids: Secondary | ICD-10-CM | POA: Diagnosis not present

## 2017-04-15 DIAGNOSIS — H02413 Mechanical ptosis of bilateral eyelids: Secondary | ICD-10-CM | POA: Diagnosis not present

## 2017-04-16 ENCOUNTER — Telehealth: Payer: Self-pay | Admitting: *Deleted

## 2017-04-16 ENCOUNTER — Telehealth: Payer: Self-pay | Admitting: Pulmonary Disease

## 2017-04-16 MED ORDER — VENTOLIN HFA 108 (90 BASE) MCG/ACT IN AERS: 2.0000 | INHALATION_SPRAY | RESPIRATORY_TRACT | 0 refills | Status: DC | PRN

## 2017-04-16 NOTE — Telephone Encounter (Signed)
Spoke with pt. She is needing a refill on Ventolin. Rx has been sent in. Nothing further was needed.

## 2017-04-16 NOTE — Telephone Encounter (Signed)
   [facility] Pre-operative Risk Assessment    Request for surgical clearance:  1. What type of surgery is being performed? BLEPHROPLASTY/INTERNAL PTOSIS REPAIR-BILATERAL     2. When is this surgery scheduled? 04/29/2017    3. What type of clearance is required (medical clearance vs. Pharmacy clearance to hold med vs. Both)? BOTH  4. Are there any medications that need to be held prior to surgery and how long?XARELTO 2 WEEKS PRIOR, IF DO NOT AGREE HOW LONG   5. Practice name and name of physician performing surgery? [facility]    6. What is your office phone and fax number? [phone] ATTN SHERRY    7. Anesthesia type (None, local, MAC, general) ? IV SEDATION

## 2017-04-18 ENCOUNTER — Ambulatory Visit (HOSPITAL_COMMUNITY): Payer: Medicare HMO

## 2017-04-18 NOTE — Telephone Encounter (Signed)
Follow Up:    Becky Newton is checking on the status of pt's clearance.Please fax this to [phone].

## 2017-04-19 ENCOUNTER — Ambulatory Visit (HOSPITAL_COMMUNITY): Payer: Medicare HMO

## 2017-04-19 NOTE — Telephone Encounter (Signed)
New message  Becky Newton verbalized that she is calling again for RN  And surgical clearance

## 2017-04-22 NOTE — Telephone Encounter (Signed)
Clinical pharmacist to review how long to hold Xarelto prior to eye lid surgery.  Patient well known to me, h/o PAF, previous seen by Dr. [name] in 2016, she had chest pain with afib, it was recommended for her to have myoview, this was not done. Admitted recently in Feb 2019 for chest pain and found to have recurrent PAF. Chest pain tend to occur more often with afib. I recommended myoview, this was not done either.   Granted eyelid surgery is low risk, will check with Dr. [name] to see his opinion.  Hilbert Corrigan PA Pager: [phone]

## 2017-04-22 NOTE — Telephone Encounter (Signed)
New Message:    Rinaldo Cloud calling to check on medical clearance for pt surgery on 4/8

## 2017-04-22 NOTE — Telephone Encounter (Signed)
Need stress test for risk stratification prior to surgery.

## 2017-04-22 NOTE — Telephone Encounter (Signed)
Agree with nuclear study preop [name]

## 2017-04-23 NOTE — Telephone Encounter (Signed)
Pt has been scheduled for myoview 4/4 and 4/5.

## 2017-04-23 NOTE — Telephone Encounter (Signed)
Spoke to nuc med, will attempt to have her stress test done this week so she can have surgery next week.

## 2017-04-23 NOTE — Telephone Encounter (Signed)
Patient with diagnosis of atrial fibrillation on Xarelto for anticoagulation.    Procedure: eyelid repair Date of procedure: TBD  CHADS2-VASc score of  4 (AGE x 2, DM2, female)  CrCl 65.5 Platelet count 223  Per office protocol, patient can hold Xarelto for 2 days prior to procedure.    Patient should restart Xarelto on the evening of procedure or day after, at discretion of procedure MD

## 2017-04-23 NOTE — Telephone Encounter (Signed)
I called the pt in regards to her upcoming eye surgery scheduled for 04/29/17. I s/w pt per Almyra Deforest, PA and Dr. [name] pt will need to have Myoview before she can be cleared for her surgery. I see pt was scheduled for Myoview already though has been cancelled. Pt states she was told by Desiree Hane that the stress test was up to her if she wanted to have it or not. I explained to her that is not the message I see here today. Pt states she had a heart monitor that she saw the results on MY CHART and that everything was fine. I advised pt that I am going to need to d/w Almyra Deforest, PA for further advice since she tells me that he told her she could have the stress test if she wants. Pt also asked for me to please let Pa know that her husband has already taken time off from work for Monday 4/8 for her eye surgery. Advised pt we will call back once we have further instructions.

## 2017-04-24 ENCOUNTER — Telehealth (HOSPITAL_COMMUNITY): Payer: Self-pay

## 2017-04-24 NOTE — Telephone Encounter (Signed)
Encounter complete. 

## 2017-04-25 ENCOUNTER — Ambulatory Visit (HOSPITAL_COMMUNITY)
Admission: RE | Admit: 2017-04-25 | Discharge: 2017-04-25 | Disposition: A | Discharge status: Home / Self Care | Payer: Medicare HMO | Source: Ambulatory Visit | Attending: Cardiovascular Disease | Admitting: Cardiovascular Disease

## 2017-04-25 ENCOUNTER — Encounter (HOSPITAL_COMMUNITY): Payer: Medicare HMO

## 2017-04-25 DIAGNOSIS — I5032 Chronic diastolic (congestive) heart failure: Secondary | ICD-10-CM | POA: Insufficient documentation

## 2017-04-25 DIAGNOSIS — I4891 Unspecified atrial fibrillation: Secondary | ICD-10-CM | POA: Diagnosis not present

## 2017-04-25 MED ORDER — TECHNETIUM TC 99M TETROFOSMIN IV KIT
30.2000 | PACK | Freq: Once | INTRAVENOUS | Status: AC | PRN
  Administered 2017-04-25: 30.2 via INTRAVENOUS
  Filled 2017-04-25: qty 31

## 2017-04-25 MED ORDER — REGADENOSON 0.4 MG/5ML IV SOLN
0.4000 mg | Freq: Once | INTRAVENOUS | Status: AC
  Administered 2017-04-25: 0.4 mg via INTRAVENOUS

## 2017-04-26 ENCOUNTER — Telehealth: Payer: Self-pay | Admitting: Cardiology

## 2017-04-26 ENCOUNTER — Encounter: Payer: Self-pay | Admitting: Physician Assistant

## 2017-04-26 ENCOUNTER — Ambulatory Visit (HOSPITAL_COMMUNITY)
Admission: RE | Admit: 2017-04-26 | Discharge: 2017-04-26 | Disposition: A | Discharge status: Home / Self Care | Payer: Medicare HMO | Source: Ambulatory Visit | Attending: Cardiovascular Disease | Admitting: Cardiovascular Disease

## 2017-04-26 ENCOUNTER — Encounter (HOSPITAL_COMMUNITY): Payer: Medicare HMO

## 2017-04-26 LAB — MYOCARDIAL PERFUSION IMAGING
CHL CUP NUCLEAR SDS: 3
CHL CUP RESTING HR STRESS: 63 {beats}/min
LV sys vol: 17 mL
LVDIAVOL: 64 mL (ref 46–106)
Peak HR: 90 {beats}/min
SRS: 0
SSS: 3
TID: 0.66

## 2017-04-26 MED ORDER — TECHNETIUM TC 99M TETROFOSMIN IV KIT
30.6000 | PACK | Freq: Once | INTRAVENOUS | Status: AC | PRN
  Administered 2017-04-26: 30.6 via INTRAVENOUS

## 2017-04-26 NOTE — Telephone Encounter (Signed)
In order to facilitate the surgery which is scheduled for next Monday and avoid delay. Cecilie Kicks our APP on call has contacted the patient and informed her to hold Xarelto for 2 days prior to surgery. I have electronically send the clearance, but in order to make sure there would not be any delay, I will also personally fax the clearance letter myself from the hospital to make sure the clearance reaches the surgeon's office prior to Monday

## 2017-04-26 NOTE — Telephone Encounter (Signed)
Left message pt's phone that she is cleared for surgery,  Stress test was normal.  I will call her again tomorrow to confirm.

## 2017-04-26 NOTE — Telephone Encounter (Signed)
   Primary Cardiologist: [name], MD  Chart reviewed as part of pre-operative protocol coverage. Patient was contacted 04/26/2017 in reference to pre-operative risk assessment for pending surgery as outlined below.  YOANNA JURCZYK was last seen on 03/27/2017 by Almyra Deforest.  Since that day, TANNER VIGNA has done well with negative stress test on 04/26/2017. Patient is cleared to proceed with surgery after holding Xarelto for 2 days  Therefore, based on ACC/AHA guidelines, the patient would be at acceptable risk for the planned procedure without further cardiovascular testing.   I will route this recommendation to the requesting party via Epic fax function and remove from pre-op pool.  Please call with questions.  [place], [place] 04/26/2017, 7:54 PM

## 2017-05-04 DIAGNOSIS — J449 Chronic obstructive pulmonary disease, unspecified: Secondary | ICD-10-CM | POA: Diagnosis not present

## 2017-05-07 ENCOUNTER — Encounter: Payer: Self-pay | Admitting: Adult Health

## 2017-05-07 DIAGNOSIS — R0602 Shortness of breath: Secondary | ICD-10-CM

## 2017-05-07 DIAGNOSIS — R062 Wheezing: Secondary | ICD-10-CM

## 2017-05-07 DIAGNOSIS — R059 Cough, unspecified: Secondary | ICD-10-CM

## 2017-05-07 DIAGNOSIS — J441 Chronic obstructive pulmonary disease with (acute) exacerbation: Secondary | ICD-10-CM

## 2017-05-07 DIAGNOSIS — R05 Cough: Secondary | ICD-10-CM

## 2017-05-07 DIAGNOSIS — J9611 Chronic respiratory failure with hypoxia: Secondary | ICD-10-CM

## 2017-05-08 DIAGNOSIS — H02421 Myogenic ptosis of right eyelid: Secondary | ICD-10-CM | POA: Diagnosis not present

## 2017-05-08 DIAGNOSIS — Z9889 Other specified postprocedural states: Secondary | ICD-10-CM | POA: Insufficient documentation

## 2017-05-08 DIAGNOSIS — H02422 Myogenic ptosis of left eyelid: Secondary | ICD-10-CM | POA: Diagnosis not present

## 2017-05-08 DIAGNOSIS — H02413 Mechanical ptosis of bilateral eyelids: Secondary | ICD-10-CM | POA: Diagnosis not present

## 2017-05-08 MED ORDER — BENZONATATE 200 MG PO CAPS: 200.0000 mg | ORAL_CAPSULE | Freq: Three times a day (TID) | ORAL | 0 refills | Status: DC | PRN

## 2017-05-14 NOTE — Progress Notes (Signed)
Subjective:   Becky Newton is a 84 y.o. female who presents for Medicare Annual (Subsequent) preventive examination.  Reports health as  [facility] completed eye surgery for ptosis  Dr. 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 HEMOGLOBIN A1C  02/22/2017   Eye exam 02/2017 Goes to [place] for pedicure Pulses are color  Eye exam; every couple of months Dr.Kim Lajoyce Corners - don'd no diabetic retinopathy Does have open angle glaucoma Ischemic optic neuropathy  Hearing is good;     Cardiac Risk Factors include: advanced age (>42mn, >>14women);diabetes mellitus;family history of premature cardiovascular disease;hypertension;female gender;obesity (BMI >30kg/m2);sedentary lifestyleEducated regarding shingrix     Objective:     Vitals: BP 100/70   Pulse 61   Ht 5' 1" (1.549 m)   Wt 200 lb (90.7 kg)   SpO2 91%   BMI 37.79 kg/m   Body mass index is 37.79 kg/m.  Advanced Directives 05/15/2017 03/08/2017 03/08/2017 10/10/2016 03/05/2016 08/24/2014  Does Patient Have a Medical Advance Directive? Yes No No Yes Yes Yes  Type of Advance Directive - - - Living will HDonaldLiving will (No Data)  Does patient want to make changes to medical advance directive? - - - - No - Patient declined No - Patient declined  Copy of [place] Chart? - - - - No - copy requested -  Would patient like information on creating a medical  advance directive? - No - Patient declined No - Patient declined - - -    Tobacco Social History   Tobacco Use  Smoking Status Former Smoker  . Packs/day: 0.50  . Years: 20.00  . Pack years: 10.00  . Types: Cigarettes  . Last attempt to quit: 08/23/1989  . Years since quitting: 27.7  Smokeless Tobacco Never Used     Counseling given: Yes   Clinical Intake:  Past Medical History:  Diagnosis Date  . A-fib ([place]   . Atrial fibrillation ([place]   . Chicken pox   . COPD (chronic obstructive pulmonary disease) ([place]   . Diabetes ([place]   . Fibrocystic breast determined by biopsy 1977  . GERD (gastroesophageal reflux disease)   . Glaucoma   . H/O hernia repair 2006  . H/O left breast biopsy 1982  . Incisional hernia   . Lung disease    Paralyzed left hemidiaphragm  . S/P scar revision   . Uterine cancer ([facility]    Past Surgical History:  Procedure Laterality Date  . APPENDECTOMY  1966  . BREAST BIOPSY    . CATARACT EXTRACTION  2004,2005  . CHOLECYSTECTOMY  1975  . HERNIA REPAIR  2006  . HIATAL HERNIA REPAIR  1966  . NISSEN FUNDOPLICATION    . TOTAL ABDOMINAL HYSTERECTOMY W/ BILATERAL SALPINGOOPHORECTOMY  1974   hx of cancer  Family History  Problem Relation Age of Onset  . Breast cancer Mother   . Arthritis Mother   . Stroke Mother   . Heart attack Mother   . Heart disease Mother   . Alcohol abuse Brother    Social History   Socioeconomic History  . Marital status: Married    Spouse name: Not on file  . Number of children: Not on file  . Years of education: Not on file  . Highest education level: Not on file  Occupational History  . Not on file  Social Needs  . Financial resource strain: Not on file  . Food insecurity:    Worry: Not on file    Inability: Not on file  . Transportation needs:    Medical: Not on file    Non-medical: Not on file  Tobacco Use  . Smoking status: Former Smoker    Packs/day: 0.50    Years: 20.00    Pack years: 10.00     Types: Cigarettes    Last attempt to quit: 08/23/1989    Years since quitting: 27.7  . Smokeless tobacco: Never Used  Substance and Sexual Activity  . Alcohol use: No    Alcohol/week: 0.0 oz  . Drug use: No  . Sexual activity: Never    Partners: Male  Lifestyle  . Physical activity:    Days per week: Not on file    Minutes per session: Not on file  . Stress: Not on file  Relationships  . Social connections:    Talks on phone: Not on file    Gets together: Not on file    Attends religious service: Not on file    Active member of club or organization: Not on file    Attends meetings of clubs or organizations: Not on file    Relationship status: Not on file  Other Topics Concern  . Not on file  Social History Narrative  . Not on file    Outpatient Encounter Medications as of 05/15/2017  Medication Sig  . AMBULATORY NON FORMULARY MEDICATION Accu-Chek FastClix Lancets Drum. Use to check blood sugar twice a day. Type 2 diabetes.  . AMBULATORY NON FORMULARY MEDICATION Accu-Chek SmartView glucose testing strips. Use to check blood sugar twice a day.  Type 2 diabetes.  . benzonatate (TESSALON) 200 MG capsule Take 1 capsule (200 mg total) by mouth 3 (three) times daily as needed for cough.  . cholecalciferol (VITAMIN D) 1000 units tablet Take 1,000 Units by mouth daily.  Marland Kitchen diltiazem (CARDIZEM CD) 180 MG 24 hr capsule Take 1 capsule (180 mg total) by mouth daily.  . furosemide (LASIX) 20 MG tablet Take 1 tablet (20 mg total) by mouth 2 (two) times daily. (Patient taking differently: Take 20 mg by mouth daily. )  . Glucosamine-Chondroit-Vit C-Mn (GLUCOSAMINE CHONDR 500 COMPLEX PO) Take by mouth.  . hydrochlorothiazide (MICROZIDE) 12.5 MG capsule TAKE 1 CAPSULE EVERY DAY  . ipratropium-albuterol (DUONEB) 0.5-2.5 (3) MG/3ML SOLN Take 3 mLs by nebulization every 4 (four) hours as needed.  . latanoprost (XALATAN) 0.005 % ophthalmic solution Place 1 drop into both eyes at bedtime.  . Lutein ([facility]  LUTEIN) 40 MG CAPS Take 1 capsule by mouth daily.  . metFORMIN (GLUCOPHAGE) 500 MG tablet Take 500 mg by mouth 2 (two) times daily with a meal.  . metoprolol succinate (TOPROL-XL) 25 MG 24 hr tablet Take 1 tablet (25 mg total) by mouth daily.  . Multiple Vitamins-Minerals (PRESERVISION AREDS PO) Take 1 capsule  by mouth 2 (two) times daily.   Marland Kitchen omeprazole (PRILOSEC) 20 MG capsule Take 1 capsule (20 mg total) by mouth 2 (two) times daily before a meal.  . ondansetron (ZOFRAN) 4 MG tablet Take 1 tablet (4 mg total) by mouth every 6 (six) hours.  . OXYGEN Inhale 2 L into the lungs.  . potassium chloride (KLOR-CON 10) 10 MEQ tablet Take 1 tablet (10 mEq total) by mouth daily.  . rivaroxaban (XARELTO) 20 MG TABS tablet Take 1 tablet (20 mg total) by mouth daily with supper.  . VENTOLIN HFA 108 (90 Base) MCG/ACT inhaler Inhale 2 puffs into the lungs every 4 (four) hours as needed for wheezing or shortness of breath.   No facility-administered encounter medications on file as of 05/15/2017.     Activities of Daily Living In your present state of health, do you have any difficulty performing the following activities: 05/15/2017 03/08/2017  Hearing? N N  Vision? N N  Difficulty concentrating or making decisions? N N  Walking or climbing stairs? Y Y  Dressing or bathing? N Y  Doing errands, shopping? Tempie Donning  Preparing Food and eating ? Y -  Using the Toilet? N -  In the past six months, have you accidently leaked urine? Y -  Comment up going to the bathroom due to lasix -  Do you have problems with loss of bowel control? N -  Managing your Medications? N -  Managing your Finances? N -  Housekeeping or managing your Housekeeping? N -  Some recent data might be hidden    Patient Care Team: [name], NP as PCP - General (Family Medicine) [name] Denice Bors, MD as PCP - Cardiology (Cardiology) [name] Denice Bors, MD as Consulting Physician (Cardiology)    Assessment:   This is a routine  wellness examination for Becky Newton.  Exercise Activities and Dietary recommendations Current Exercise Habits: Home exercise routine  Goals    . Patient Stated     Go to gospel singings to [place] and other places for relaxation and enjoyment.         Fall Risk Fall Risk  05/15/2017 03/05/2016  Falls in the past year? No No  Risk for fall due to : - Impaired mobility    Depression Screen PHQ 2/9 Scores 05/15/2017 03/05/2016  PHQ - 2 Score 0 0     Cognitive Function MMSE - Mini Mental State Exam 05/15/2017  Not completed: (No Data)   Ad8 score reviewed for issues:  Issues making decisions:  Less interest in hobbies / activities:  Repeats questions, stories (family complaining):  Trouble using ordinary gadgets (microwave, computer, phone):  Forgets the month or year:   Mismanaging finances:   Remembering appts:  Daily problems with thinking and/or memory: Ad8 score is=0 Good historian;        6CIT Screen 03/05/2016  What Year? 0 points  What month? 0 points  What time? 0 points  Count back from 20 0 points  Months in reverse 0 points  Repeat phrase 0 points  Total Score 0    Immunization History  Administered Date(s) Administered  . Influenza, High Dose Seasonal PF 09/20/2015  . Influenza,inj,Quad PF,6+ Mos 10/31/2016  . Influenza-Unspecified 10/03/2014  . Pneumococcal Conjugate-13 10/31/2016    Screening Tests Health Maintenance  Topic Date Due  . HEMOGLOBIN A1C  02/22/2017  . TETANUS/TDAP  05/16/2018 (Originally 11/28/1952)  . URINE MICROALBUMIN  05/22/2018 (Originally 11/27/1943)  . INFLUENZA VACCINE  08/22/2017  . PNA vac  Low Risk Adult (2 of 2 - PPSV23) 10/31/2017  . OPHTHALMOLOGY EXAM  02/22/2018  . FOOT EXAM  05/16/2018          Plan:      PCP Notes   Health Maintenance Educated regarding shingrix  Eye exam 02/2017 and eye visits q 2 months due to glaucoma\ Does have open angle glaucoma Ischemic optic neuropathy  Goes to [facility]  for pedicure; feet screen completed and WNL  Dr.Kim Lajoyce Corners checks eyes- the patient reports no diabetic retinopathy  Will get tetanus at drug store due to pricing  Discussed psv 23 due this year but feels she had one at 12  To make an apt for A1c check and other labs  Ur micro albumin postponed per the patient  Hearing is WNL   Abnormal Screens  no    Referrals  No   Patient concerns; Would like refill 1. Benzonatate (tessalon) 200 ; take on capsule tid as needed (looks like this was reordered)( refill was ordered for 20 capsules and the pharmacy refilled but the patient is out today)  2. Request refill on  Zofran, 72m post op but would like to keep on hand to combat nausea.  ( will send basket note for refills) stated mail order pharmacy was fine)   Nurse Concerns; As noted   Next PCP apt Offered A1c today, but the patient stated she would reschedule for labs, BS at home running 120       I have personally reviewed and noted the following in the patient's chart:   . Medical and social history . Use of alcohol, tobacco or illicit drugs  . Current medications and supplements . Functional ability and status . Nutritional status . Physical activity . Advanced directives . List of other physicians . Hospitalizations, surgeries, and ER visits in previous 12 months . Vitals . Screenings to include cognitive, depression, and falls . Referrals and appointments  In addition, I have reviewed and discussed with patient certain preventive protocols, quality metrics, and best practice recommendations. A written personalized care plan for preventive services as well as general preventive health recommendations were provided to patient.     HWynetta Fines RN  05/15/2017

## 2017-05-15 ENCOUNTER — Other Ambulatory Visit: Payer: Self-pay | Admitting: Adult Health

## 2017-05-15 ENCOUNTER — Telehealth: Payer: Self-pay

## 2017-05-15 ENCOUNTER — Encounter: Payer: Medicare HMO | Admitting: Adult Health

## 2017-05-15 ENCOUNTER — Ambulatory Visit (INDEPENDENT_AMBULATORY_CARE_PROVIDER_SITE_OTHER): Payer: Medicare HMO

## 2017-05-15 VITALS — BP 100/70 | HR 61 | Ht 61.0 in | Wt 200.0 lb

## 2017-05-15 DIAGNOSIS — J9611 Chronic respiratory failure with hypoxia: Secondary | ICD-10-CM

## 2017-05-15 DIAGNOSIS — R05 Cough: Secondary | ICD-10-CM

## 2017-05-15 DIAGNOSIS — R0602 Shortness of breath: Secondary | ICD-10-CM

## 2017-05-15 DIAGNOSIS — R062 Wheezing: Secondary | ICD-10-CM

## 2017-05-15 DIAGNOSIS — R059 Cough, unspecified: Secondary | ICD-10-CM

## 2017-05-15 DIAGNOSIS — Z0000 Encounter for general adult medical examination without abnormal findings: Secondary | ICD-10-CM | POA: Diagnosis not present

## 2017-05-15 DIAGNOSIS — J441 Chronic obstructive pulmonary disease with (acute) exacerbation: Secondary | ICD-10-CM

## 2017-05-15 MED ORDER — ONDANSETRON HCL 4 MG PO TABS: 4.0000 mg | ORAL_TABLET | Freq: Four times a day (QID) | ORAL | 0 refills | Status: DC | PRN

## 2017-05-15 MED ORDER — BENZONATATE 200 MG PO CAPS: 200.0000 mg | ORAL_CAPSULE | Freq: Three times a day (TID) | ORAL | 1 refills | Status: DC | PRN

## 2017-05-15 NOTE — Patient Instructions (Addendum)
Becky Newton , Thank you for taking time to come for your Medicare Wellness Visit. I appreciate your ongoing commitment to your health goals. Please review the following plan we discussed and let me know if I can assist you in the future.   Will make an apt with Tommi Rumps for up regarding A1c and other  De[facility] of Hearing [facility] Services - can assist with hearing aid x 1  No reviews  [facility] [phone]  [phone] 0- Carrolyn Leigh  [email or web address]  A Tetanus is recommended every 10 years. Medicare covers a tetanus if you have a cut or wound; otherwise, there may be a charge. If you had not had a tetanus with pertusses, known as the Tdap, you can take this anytime.   New shingles shots as of May 2018 Shingrix is a vaccine for the prevention of Shingles in Adults 94 and older.  If you are on Medicare, the shingrix is covered under your Part D plan, so you will take both of the vaccines in the series at your pharmacy. Please check with your benefits regarding applicable copays or out of pocket expenses.  The Shingrix is given in 2 vaccines approx 8 weeks apart. You must receive the 2nd dose prior to 6 months from receipt of the first. Please have the pharmacist print out you Immunization  dates for our office records     These are the goals we discussed: Goals    . Patient Stated     Go to gospel singings to [place] and other places for relaxation and enjoyment.         This is a list of the screening recommended for you and due dates:  Health Maintenance  Topic Date Due  . Complete foot exam   [birth year]27/1943  . Urine Protein Check  11/27/1945  . Tetanus Vaccine  11/28/1954  . DEXA scan (bone density measurement)  11/27/2000  . Eye exam for diabetics  11/21/2016  . Hemoglobin A1C  02/22/2017  . Flu Shot  08/22/2017  . Pneumonia vaccines (2 of 2 - PPSV23)  10/31/2017      Fall Prevention in the Home Falls can cause injuries. They can happen to people of all ages. There are many things you can do to make your home safe and to help prevent falls. What can I do on the outside of my home?  Regularly fix the edges of walkways and driveways and fix any cracks.  Remove anything that might make you trip as you walk through a door, such as a raised step or threshold.  Trim any bushes or trees on the path to your home.  Use bright outdoor lighting.  Clear any walking paths of anything that might make someone trip, such as rocks or tools.  Regularly check to see if handrails are loose or broken. Make sure that both sides of any steps have handrails.  Any raised decks and porches should have guardrails on the edges.  Have any leaves, snow, or ice cleared regularly.  Use sand or salt on walking paths during winter.  Clean up any spills in your garage right away. This includes oil or grease spills. What can I do in the bathroom?  Use night lights.  Install grab bars by the toilet and in the tub and shower. Do not use towel bars as grab bars.  Use non-skid mats or decals in the tub or shower.  If you need to sit  down in the shower, use a plastic, non-slip stool.  Keep the floor dry. Clean up any water that spills on the floor as soon as it happens.  Remove soap buildup in the tub or shower regularly.  Attach bath mats securely with double-sided non-slip rug tape.  Do not have throw rugs and other things on the floor that can make you trip. What can I do in the bedroom?  Use night lights.  Make sure that you have a light by your bed that is easy to reach.  Do not use any sheets or blankets that are too big for your bed. They should not hang down onto the floor.  Have a firm chair that has side arms. You can use this for support while you get dressed.  Do not have throw rugs and other things on the floor that can make you  trip. What can I do in the kitchen?  Clean up any spills right away.  Avoid walking on wet floors.  Keep items that you use a lot in easy-to-reach places.  If you need to reach something above you, use a strong step stool that has a grab bar.  Keep electrical cords out of the way.  Do not use floor polish or wax that makes floors slippery. If you must use wax, use non-skid floor wax.  Do not have throw rugs and other things on the floor that can make you trip. What can I do with my stairs?  Do not leave any items on the stairs.  Make sure that there are handrails on both sides of the stairs and use them. Fix handrails that are broken or loose. Make sure that handrails are as long as the stairways.  Check any carpeting to make sure that it is firmly attached to the stairs. Fix any carpet that is loose or worn.  Avoid having throw rugs at the top or bottom of the stairs. If you do have throw rugs, attach them to the floor with carpet tape.  Make sure that you have a light switch at the top of the stairs and the bottom of the stairs. If you do not have them, ask someone to add them for you. What else can I do to help prevent falls?  Wear shoes that: ? Do not have high heels. ? Have rubber bottoms. ? Are comfortable and fit you well. ? Are closed at the toe. Do not wear sandals.  If you use a stepladder: ? Make sure that it is fully opened. Do not climb a closed stepladder. ? Make sure that both sides of the stepladder are locked into place. ? Ask someone to hold it for you, if possible.  Clearly mark and make sure that you can see: ? Any grab bars or handrails. ? First and last steps. ? Where the edge of each step is.  Use tools that help you move around (mobility aids) if they are needed. These include: ? Canes. ? Walkers. ? Scooters. ? Crutches.  Turn on the lights when you go into a dark area. Replace any light bulbs as soon as they burn out.  Set up your  furniture so you have a clear path. Avoid moving your furniture around.  If any of your floors are uneven, fix them.  If there are any pets around you, be aware of where they are.  Review your medicines with your doctor. Some medicines can make you feel dizzy. This can increase your chance of  falling. Ask your doctor what other things that you can do to help prevent falls. This information is not intended to replace advice given to you by your health care provider. Make sure you discuss any questions you have with your health care provider. Document Released: 11/04/2008 Document Revised: 06/16/2015 Document Reviewed: 02/12/2014 Elsevier Interactive Patient Education  2018 [place] Maintenance, Female Adopting a healthy lifestyle and getting preventive care can go a long way to promote health and wellness. Talk with your health care provider about what schedule of regular examinations is right for you. This is a good chance for you to check in with your provider about disease prevention and staying healthy. In between checkups, there are plenty of things you can do on your own. Experts have done a lot of research about which lifestyle changes and preventive measures are most likely to keep you healthy. Ask your health care provider for more information. Weight and diet Eat a healthy diet  Be sure to include plenty of vegetables, fruits, low-fat dairy products, and lean protein.  Do not eat a lot of foods high in solid fats, added sugars, or salt.  Get regular exercise. This is one of the most important things you can do for your health. ? Most adults should exercise for at least 150 minutes each week. The exercise should increase your heart rate and make you sweat (moderate-intensity exercise). ? Most adults should also do strengthening exercises at least twice a week. This is in addition to the moderate-intensity exercise.  Maintain a healthy weight  Body mass index (BMI) is  a measurement that can be used to identify possible weight problems. It estimates body fat based on height and weight. Your health care provider can help determine your BMI and help you achieve or maintain a healthy weight.  For females 52 years of age and older: ? A BMI below 18.5 is considered underweight. ? A BMI of 18.5 to 24.9 is normal. ? A BMI of 25 to 29.9 is considered overweight. ? A BMI of 30 and above is considered obese.  Watch levels of cholesterol and blood lipids  You should start having your blood tested for lipids and cholesterol at 82 years of age, then have this test every 5 years.  You may need to have your cholesterol levels checked more often if: ? Your lipid or cholesterol levels are high. ? You are older than 82 years of age. ? You are at high risk for heart disease.  Cancer screening Lung Cancer  Lung cancer screening is recommended for adults 51-7 years old who are at high risk for lung cancer because of a history of smoking.  A yearly low-dose CT scan of the lungs is recommended for people who: ? Currently smoke. ? Have quit within the past 15 years. ? Have at least a 30-pack-year history of smoking. A pack year is smoking an average of one pack of cigarettes a day for 1 year.  Yearly screening should continue until it has been 15 years since you quit.  Yearly screening should stop if you develop a health problem that would prevent you from having lung cancer treatment.  Breast Cancer  Practice breast self-awareness. This means understanding how your breasts normally appear and feel.  It also means doing regular breast self-exams. Let your health care provider know about any changes, no matter how small.  If you are in your 20s or 30s, you should have a clinical breast exam (CBE)  by a health care provider every 1-3 years as part of a regular health exam.  If you are 61 or older, have a CBE every year. Also consider having a breast X-ray (mammogram)  every year.  If you have a family history of breast cancer, talk to your health care provider about genetic screening.  If you are at high risk for breast cancer, talk to your health care provider about having an MRI and a mammogram every year.  Breast cancer gene (BRCA) assessment is recommended for women who have family members with BRCA-related cancers. BRCA-related cancers include: ? Breast. ? Ovarian. ? Tubal. ? Peritoneal cancers.  Results of the assessment will determine the need for genetic counseling and BRCA1 and BRCA2 testing.  Cervical Cancer Your health care provider may recommend that you be screened regularly for cancer of the pelvic organs (ovaries, uterus, and vagina). This screening involves a pelvic examination, including checking for microscopic changes to the surface of your cervix (Pap test). You may be encouraged to have this screening done every 3 years, beginning at age 28.  For women ages 66-65, health care providers may recommend pelvic exams and Pap testing every 3 years, or they may recommend the Pap and pelvic exam, combined with testing for human papilloma virus (HPV), every 5 years. Some types of HPV increase your risk of cervical cancer. Testing for HPV may also be done on women of any age with unclear Pap test results.  Other health care providers may not recommend any screening for nonpregnant women who are considered low risk for pelvic cancer and who do not have symptoms. Ask your health care provider if a screening pelvic exam is right for you.  If you have had past treatment for cervical cancer or a condition that could lead to cancer, you need Pap tests and screening for cancer for at least 20 years after your treatment. If Pap tests have been discontinued, your risk factors (such as having a new sexual partner) need to be reassessed to determine if screening should resume. Some women have medical problems that increase the chance of getting cervical  cancer. In these cases, your health care provider may recommend more frequent screening and Pap tests.  Colorectal Cancer  This type of cancer can be detected and often prevented.  Routine colorectal cancer screening usually begins at 82 years of age and continues through 82 years of age.  Your health care provider may recommend screening at an earlier age if you have risk factors for colon cancer.  Your health care provider may also recommend using home test kits to check for hidden blood in the stool.  A small camera at the end of a tube can be used to examine your colon directly (sigmoidoscopy or colonoscopy). This is done to check for the earliest forms of colorectal cancer.  Routine screening usually begins at age 43.  Direct examination of the colon should be repeated every 5-10 years through 82 years of age. However, you may need to be screened more often if early forms of precancerous polyps or small growths are found.  Skin Cancer  Check your skin from head to toe regularly.  Tell your health care provider about any new moles or changes in moles, especially if there is a change in a mole's shape or color.  Also tell your health care provider if you have a mole that is larger than the size of a pencil eraser.  Always use sunscreen. Apply sunscreen liberally and  repeatedly throughout the day.  Protect yourself by wearing long sleeves, pants, a wide-brimmed hat, and sunglasses whenever you are outside.  Heart disease, diabetes, and high blood pressure  High blood pressure causes heart disease and increases the risk of stroke. High blood pressure is more likely to develop in: ? People who have blood pressure in the high end of the normal range (130-139/85-89 mm Hg). ? People who are overweight or obese. ? People who are African American.  If you are 70-53 years of age, have your blood pressure checked every 3-5 years. If you are 95 years of age or older, have your blood  pressure checked every year. You should have your blood pressure measured twice-once when you are at a hospital or clinic, and once when you are not at a hospital or clinic. Record the average of the two measurements. To check your blood pressure when you are not at a hospital or clinic, you can use: ? An automated blood pressure machine at a pharmacy. ? A home blood pressure monitor.  If you are between 57 years and 29 years old, ask your health care provider if you should take aspirin to prevent strokes.  Have regular diabetes screenings. This involves taking a blood sample to check your fasting blood sugar level. ? If you are at a normal weight and have a low risk for diabetes, have this test once every three years after 82 years of age. ? If you are overweight and have a high risk for diabetes, consider being tested at a younger age or more often. Preventing infection Hepatitis B  If you have a higher risk for hepatitis B, you should be screened for this virus. You are considered at high risk for hepatitis B if: ? You were born in a country where hepatitis B is common. Ask your health care provider which countries are considered high risk. ? Your parents were born in a high-risk country, and you have not been immunized against hepatitis B (hepatitis B vaccine). ? You have HIV or AIDS. ? You use needles to inject street drugs. ? You live with someone who has hepatitis B. ? You have had sex with someone who has hepatitis B. ? You get hemodialysis treatment. ? You take certain medicines for conditions, including cancer, organ transplantation, and autoimmune conditions.  Hepatitis C  Blood testing is recommended for: ? Everyone born from [birth year] through 1963. ? Anyone with known risk factors for hepatitis C.  Sexually transmitted infections (STIs)  You should be screened for sexually transmitted infections (STIs) including gonorrhea and chlamydia if: ? You are sexually active and are  younger than 82 years of age. ? You are older than 82 years of age and your health care provider tells you that you are at risk for this type of infection. ? Your sexual activity has changed since you were last screened and you are at an increased risk for chlamydia or gonorrhea. Ask your health care provider if you are at risk.  If you do not have HIV, but are at risk, it may be recommended that you take a prescription medicine daily to prevent HIV infection. This is called pre-exposure prophylaxis (PrEP). You are considered at risk if: ? You are sexually active and do not regularly use condoms or know the HIV status of your partner(s). ? You take drugs by injection. ? You are sexually active with a partner who has HIV.  Talk with your health care provider about whether you  are at high risk of being infected with HIV. If you choose to begin PrEP, you should first be tested for HIV. You should then be tested every 3 months for as long as you are taking PrEP. Pregnancy  If you are premenopausal and you may become pregnant, ask your health care provider about preconception counseling.  If you may become pregnant, take 400 to 800 micrograms (mcg) of folic acid every day.  If you want to prevent pregnancy, talk to your health care provider about birth control (contraception). Osteoporosis and menopause  Osteoporosis is a disease in which the bones lose minerals and strength with aging. This can result in serious bone fractures. Your risk for osteoporosis can be identified using a bone density scan.  If you are 57 years of age or older, or if you are at risk for osteoporosis and fractures, ask your health care provider if you should be screened.  Ask your health care provider whether you should take a calcium or vitamin D supplement to lower your risk for osteoporosis.  Menopause may have certain physical symptoms and risks.  Hormone replacement therapy may reduce some of these symptoms and  risks. Talk to your health care provider about whether hormone replacement therapy is right for you. Follow these instructions at home:  Schedule regular health, dental, and eye exams.  Stay current with your immunizations.  Do not use any tobacco products including cigarettes, chewing tobacco, or electronic cigarettes.  If you are pregnant, do not drink alcohol.  If you are breastfeeding, limit how much and how often you drink alcohol.  Limit alcohol intake to no more than 1 drink per day for nonpregnant women. One drink equals 12 ounces of beer, 5 ounces of wine, or 1 ounces of hard liquor.  Do not use street drugs.  Do not share needles.  Ask your health care provider for help if you need support or information about quitting drugs.  Tell your health care provider if you often feel depressed.  Tell your health care provider if you have ever been abused or do not feel safe at home. This information is not intended to replace advice given to you by your health care provider. Make sure you discuss any questions you have with your health care provider. Document Released: 07/24/2010 Document Revised: 06/16/2015 Document Reviewed: 10/12/2014 Elsevier Interactive Patient Education  [employer].

## 2017-05-15 NOTE — Telephone Encounter (Signed)
Last OV 03/13/2017   Sent to PCP for approval

## 2017-05-15 NOTE — Telephone Encounter (Addendum)
In for AWV today Would like refills on   1. Benzonatate (tessalon) 200 ; take on capsule tid as needed (looks like this was reordered)( refill was ordered for 20 capsules and the pharmacy refilled but the patient is out today)   2. Request refill on  Zofran, 72m post op but would like to keep on hand to combat nausea.  ( will send basket note for refills) stated mail order pharmacy was fine)   Thanks SManuela Schwartz

## 2017-05-15 NOTE — Progress Notes (Signed)
I have reviewed and agree with this plan

## 2017-05-15 NOTE — Telephone Encounter (Signed)
Sent to PCP for approval.  

## 2017-05-16 ENCOUNTER — Encounter: Payer: Self-pay | Admitting: Adult Health

## 2017-05-16 NOTE — Progress Notes (Signed)
I have reviewed and agree with this plan

## 2017-05-22 ENCOUNTER — Encounter: Payer: Self-pay | Admitting: Adult Health

## 2017-06-03 ENCOUNTER — Encounter: Payer: Self-pay | Admitting: Adult Health

## 2017-06-03 DIAGNOSIS — J449 Chronic obstructive pulmonary disease, unspecified: Secondary | ICD-10-CM | POA: Diagnosis not present

## 2017-06-04 MED ORDER — GLUCOSE BLOOD VI STRP: ORAL_STRIP | 3 refills | Status: DC

## 2017-06-05 DIAGNOSIS — H401133 Primary open-angle glaucoma, bilateral, severe stage: Secondary | ICD-10-CM | POA: Diagnosis not present

## 2017-06-06 ENCOUNTER — Encounter: Payer: Self-pay | Admitting: Adult Health

## 2017-06-06 ENCOUNTER — Ambulatory Visit (INDEPENDENT_AMBULATORY_CARE_PROVIDER_SITE_OTHER): Payer: Medicare HMO | Admitting: Adult Health

## 2017-06-06 VITALS — BP 140/70 | HR 70 | Temp 97.8°F

## 2017-06-06 DIAGNOSIS — E119 Type 2 diabetes mellitus without complications: Secondary | ICD-10-CM

## 2017-06-06 LAB — POCT GLYCOSYLATED HEMOGLOBIN (HGB A1C): Hemoglobin A1C: 5.9

## 2017-06-06 MED ORDER — CYCLOBENZAPRINE HCL 5 MG PO TABS: 5.0000 mg | ORAL_TABLET | Freq: Every day | ORAL | 1 refills | Status: DC

## 2017-06-06 NOTE — Progress Notes (Signed)
Subjective:    Patient ID: Becky Newton, female    DOB: [birth year], 84 y.o.   MRN: [id number]  HPI  84 year old female who  has a past medical history of A-fib ([place]), Atrial fibrillation ([place]), Chicken pox, COPD (chronic obstructive pulmonary disease) ([place]), Diabetes ([place]), Fibrocystic breast determined by biopsy (1977), GERD (gastroesophageal reflux disease), Glaucoma, H/O hernia repair (2006), H/O left breast biopsy (1982), Incisional hernia, Lung disease, S/P scar revision, and Uterine cancer ([place]).  She presents to the office today for follow up regarding diabetes. She is currently maintained on Metformin 500 mg BID. Her last A1c in August 2018 was 6.6.   She monitors her blood sugars at home and reports readings in the 120-130's.   She has no acute complaints.    Review of Systems See HPI   Past Medical History:  Diagnosis Date  . A-fib ([place])   . Atrial fibrillation ([place])   . Chicken pox   . COPD (chronic obstructive pulmonary disease) ([place])   . Diabetes ([place])   . Fibrocystic breast determined by biopsy 1977  . GERD (gastroesophageal reflux disease)   . Glaucoma   . H/O hernia repair 2006  . H/O left breast biopsy 1982  . Incisional hernia   . Lung disease    Paralyzed left hemidiaphragm  . S/P scar revision   . Uterine cancer [facility])     Social History   Socioeconomic History  . Marital status: Married    Spouse name: Not on file  . Number of children: Not on file  . Years of education: Not on file  . Highest education level: Not on file  Occupational History  . Not on file  Social Needs  . Financial resource strain: Not on file  . Food insecurity:    Worry: Not on file    Inability: Not on file  . Transportation needs:    Medical: Not on file    Non-medical: Not on file  Tobacco Use  . Smoking status: Former Smoker    Packs/day: 0.50    Years: 20.00    Pack years: 10.00    Types: Cigarettes    Last attempt to quit: 08/23/1989    Years since  quitting: 27.8  . Smokeless tobacco: Never Used  Substance and Sexual Activity  . Alcohol use: No    Alcohol/week: 0.0 oz  . Drug use: No  . Sexual activity: Never    Partners: Male  Lifestyle  . Physical activity:    Days per week: Not on file    Minutes per session: Not on file  . Stress: Not on file  Relationships  . Social connections:    Talks on phone: Not on file    Gets together: Not on file    Attends religious service: Not on file    Active member of club or organization: Not on file    Attends meetings of clubs or organizations: Not on file    Relationship status: Not on file  . Intimate partner violence:    Fear of current or ex partner: Not on file    Emotionally abused: Not on file    Physically abused: Not on file    Forced sexual activity: Not on file  Other Topics Concern  . Not on file  Social History Narrative  . Not on file    Past Surgical History:  Procedure Laterality Date  . APPENDECTOMY  1966  . BREAST BIOPSY    .  CATARACT EXTRACTION  2004,2005  . CHOLECYSTECTOMY  1975  . HERNIA REPAIR  2006  . HIATAL HERNIA REPAIR  1966  . NISSEN FUNDOPLICATION    . TOTAL ABDOMINAL HYSTERECTOMY W/ BILATERAL SALPINGOOPHORECTOMY  1974   hx of cancer     Family History  Problem Relation Age of Onset  . Breast cancer Mother   . Arthritis Mother   . Stroke Mother   . Heart attack Mother   . Heart disease Mother   . Alcohol abuse Brother     Allergies  Allergen Reactions  . Iodine Nausea And Vomiting  . Ivp Dye [Iodinated Diagnostic Agents] Nausea And Vomiting    Current Outpatient Medications on File Prior to Visit  Medication Sig Dispense Refill  . AMBULATORY NON FORMULARY MEDICATION Accu-Chek FastClix Lancets Drum. Use to check blood sugar twice a day. Type 2 diabetes. 5 Units 11  . AMBULATORY NON FORMULARY MEDICATION Accu-Chek SmartView glucose testing strips. Use to check blood sugar twice a day.  Type 2 diabetes. 100 Units 11  . benzonatate  (TESSALON) 200 MG capsule Take 1 capsule (200 mg total) by mouth 3 (three) times daily as needed for cough. 20 capsule 1  . cholecalciferol (VITAMIN D) 1000 units tablet Take 1,000 Units by mouth daily.    Marland Kitchen diltiazem (CARDIZEM CD) 180 MG 24 hr capsule Take 1 capsule (180 mg total) by mouth daily. 90 capsule 3  . furosemide (LASIX) 20 MG tablet Take 1 tablet (20 mg total) by mouth 2 (two) times daily. (Patient taking differently: Take 20 mg by mouth daily. ) 180 tablet 1  . Glucosamine-Chondroit-Vit C-Mn (GLUCOSAMINE CHONDR 500 COMPLEX PO) Take by mouth.    Marland Kitchen glucose blood test strip USE TO TEST BLOOD GLUCOSE ONCE DAILY 300 each 3  . hydrochlorothiazide (MICROZIDE) 12.5 MG capsule TAKE 1 CAPSULE EVERY DAY 90 capsule 1  . ipratropium-albuterol (DUONEB) 0.5-2.5 (3) MG/3ML SOLN Take 3 mLs by nebulization every 4 (four) hours as needed. 360 mL 3  . latanoprost (XALATAN) 0.005 % ophthalmic solution Place 1 drop into both eyes at bedtime.    . Lutein ([employer] LUTEIN) 40 MG CAPS Take 1 capsule by mouth daily.    . metFORMIN (GLUCOPHAGE) 500 MG tablet Take 500 mg by mouth 2 (two) times daily with a meal.    . metoprolol succinate (TOPROL-XL) 25 MG 24 hr tablet Take 1 tablet (25 mg total) by mouth daily. 14 tablet 0  . Multiple Vitamins-Minerals (PRESERVISION AREDS PO) Take 1 capsule by mouth 2 (two) times daily.     Marland Kitchen omeprazole (PRILOSEC) 20 MG capsule Take 1 capsule (20 mg total) by mouth 2 (two) times daily before a meal. 180 capsule 3  . ondansetron (ZOFRAN) 4 MG tablet Take 1 tablet (4 mg total) by mouth every 6 (six) hours as needed for nausea or vomiting. 20 tablet 0  . OXYGEN Inhale 2 L into the lungs.    . potassium chloride (KLOR-CON 10) 10 MEQ tablet Take 1 tablet (10 mEq total) by mouth daily. 90 tablet 1  . rivaroxaban (XARELTO) 20 MG TABS tablet Take 1 tablet (20 mg total) by mouth daily with supper. 90 tablet 3  . VENTOLIN HFA 108 (90 Base) MCG/ACT inhaler Inhale 2 puffs into the lungs every 4  (four) hours as needed for wheezing or shortness of breath. 3 Inhaler 0   No current facility-administered medications on file prior to visit.     BP 140/70   Pulse 70   Temp  97.8 F (36.6 C) (Oral)   SpO2 96%       Objective:   Physical Exam  Constitutional: She appears well-developed and well-nourished. No distress.  Cardiovascular: Normal rate, regular rhythm, normal heart sounds and intact distal pulses. Exam reveals no gallop and no friction rub.  No murmur heard. Pulmonary/Chest: Effort normal and breath sounds normal. No stridor. No respiratory distress. She has no wheezes. She has no rales. She exhibits no tenderness.  Skin: She is not diaphoretic.  Vitals reviewed.     Assessment & Plan:  1. Type 2 diabetes mellitus without complication, without long-term current use of insulin (HCC)  - POCT A1C= has improved - 5.9 - Continue with current POC  - Follow up for CPE in 3 months   [name], NP

## 2017-06-06 NOTE — Progress Notes (Deleted)
HPI: FU atrial fibrillation. Echocardiogram February 2019 showed normal LV function, mild biatrial enlargement and mild aortic insufficiency.  Holter monitor March 2019 showed sinus rhythm with PACs and PVCs.  No significant bradycardia.  Nuclear study April 2019 showed ejection fraction 73% with soft tissue attenuation but no ischemia.  Patient readmitted February 2019 with chest pain.  This occurred in the setting of atrial fibrillation.  She ruled out.  Since last seen,   Current Outpatient Medications  Medication Sig Dispense Refill  . AMBULATORY NON FORMULARY MEDICATION Accu-Chek FastClix Lancets Drum. Use to check blood sugar twice a day. Type 2 diabetes. 5 Units 11  . AMBULATORY NON FORMULARY MEDICATION Accu-Chek SmartView glucose testing strips. Use to check blood sugar twice a day.  Type 2 diabetes. 100 Units 11  . benzonatate (TESSALON) 200 MG capsule Take 1 capsule (200 mg total) by mouth 3 (three) times daily as needed for cough. 20 capsule 1  . cholecalciferol (VITAMIN D) 1000 units tablet Take 1,000 Units by mouth daily.    . cyclobenzaprine (FLEXERIL) 5 MG tablet Take 1 tablet (5 mg total) by mouth at bedtime. 5 tablet 1  . diltiazem (CARDIZEM CD) 180 MG 24 hr capsule Take 1 capsule (180 mg total) by mouth daily. 90 capsule 3  . furosemide (LASIX) 20 MG tablet Take 1 tablet (20 mg total) by mouth 2 (two) times daily. (Patient taking differently: Take 20 mg by mouth daily. ) 180 tablet 1  . Glucosamine-Chondroit-Vit C-Mn (GLUCOSAMINE CHONDR 500 COMPLEX PO) Take by mouth.    Marland Kitchen glucose blood test strip USE TO TEST BLOOD GLUCOSE ONCE DAILY 300 each 3  . hydrochlorothiazide (MICROZIDE) 12.5 MG capsule TAKE 1 CAPSULE EVERY DAY 90 capsule 1  . ipratropium-albuterol (DUONEB) 0.5-2.5 (3) MG/3ML SOLN Take 3 mLs by nebulization every 4 (four) hours as needed. 360 mL 3  . latanoprost (XALATAN) 0.005 % ophthalmic solution Place 1 drop into both eyes at bedtime.    . Lutein ([employer]S LUTEIN) 40  MG CAPS Take 1 capsule by mouth daily.    . metFORMIN (GLUCOPHAGE) 500 MG tablet Take 500 mg by mouth 2 (two) times daily with a meal.    . metoprolol succinate (TOPROL-XL) 25 MG 24 hr tablet Take 1 tablet (25 mg total) by mouth daily. 14 tablet 0  . Multiple Vitamins-Minerals (PRESERVISION AREDS PO) Take 1 capsule by mouth 2 (two) times daily.     Marland Kitchen omeprazole (PRILOSEC) 20 MG capsule Take 1 capsule (20 mg total) by mouth 2 (two) times daily before a meal. 180 capsule 3  . ondansetron (ZOFRAN) 4 MG tablet Take 1 tablet (4 mg total) by mouth every 6 (six) hours as needed for nausea or vomiting. 20 tablet 0  . OXYGEN Inhale 2 L into the lungs.    . potassium chloride (KLOR-CON 10) 10 MEQ tablet Take 1 tablet (10 mEq total) by mouth daily. 90 tablet 1  . rivaroxaban (XARELTO) 20 MG TABS tablet Take 1 tablet (20 mg total) by mouth daily with supper. 90 tablet 3  . VENTOLIN HFA 108 (90 Base) MCG/ACT inhaler Inhale 2 puffs into the lungs every 4 (four) hours as needed for wheezing or shortness of breath. 3 Inhaler 0   No current facility-administered medications for this visit.      Past Medical History:  Diagnosis Date  . A-fib ([place])   . Atrial fibrillation ([place])   . Chicken pox   . COPD (chronic obstructive pulmonary disease) ([place])   .  Diabetes ([place])   . Fibrocystic breast determined by biopsy 1977  . GERD (gastroesophageal reflux disease)   . Glaucoma   . H/O hernia repair 2006  . H/O left breast biopsy 1982  . Incisional hernia   . Lung disease    Paralyzed left hemidiaphragm  . S/P scar revision   . Uterine cancer [facility])     Past Surgical History:  Procedure Laterality Date  . APPENDECTOMY  1966  . BREAST BIOPSY    . CATARACT EXTRACTION  2004,2005  . CHOLECYSTECTOMY  1975  . HERNIA REPAIR  2006  . HIATAL HERNIA REPAIR  1966  . NISSEN FUNDOPLICATION    . TOTAL ABDOMINAL HYSTERECTOMY W/ BILATERAL SALPINGOOPHORECTOMY  1974   hx of cancer     Social History   Socioeconomic  History  . Marital status: Married    Spouse name: Not on file  . Number of children: Not on file  . Years of education: Not on file  . Highest education level: Not on file  Occupational History  . Not on file  Social Needs  . Financial resource strain: Not on file  . Food insecurity:    Worry: Not on file    Inability: Not on file  . Transportation needs:    Medical: Not on file    Non-medical: Not on file  Tobacco Use  . Smoking status: Former Smoker    Packs/day: 0.50    Years: 20.00    Pack years: 10.00    Types: Cigarettes    Last attempt to quit: 08/23/1989    Years since quitting: 27.8  . Smokeless tobacco: Never Used  Substance and Sexual Activity  . Alcohol use: No    Alcohol/week: 0.0 oz  . Drug use: No  . Sexual activity: Never    Partners: Male  Lifestyle  . Physical activity:    Days per week: Not on file    Minutes per session: Not on file  . Stress: Not on file  Relationships  . Social connections:    Talks on phone: Not on file    Gets together: Not on file    Attends religious service: Not on file    Active member of club or organization: Not on file    Attends meetings of clubs or organizations: Not on file    Relationship status: Not on file  . Intimate partner violence:    Fear of current or ex partner: Not on file    Emotionally abused: Not on file    Physically abused: Not on file    Forced sexual activity: Not on file  Other Topics Concern  . Not on file  Social History Narrative  . Not on file    Family History  Problem Relation Age of Onset  . Breast cancer Mother   . Arthritis Mother   . Stroke Mother   . Heart attack Mother   . Heart disease Mother   . Alcohol abuse Brother     ROS: no fevers or chills, productive cough, hemoptysis, dysphasia, odynophagia, melena, hematochezia, dysuria, hematuria, rash, seizure activity, orthopnea, PND, pedal edema, claudication. Remaining systems are negative.  Physical Exam: Well-developed  well-nourished in no acute distress.  Skin is warm and dry.  HEENT is normal.  Neck is supple.  Chest is clear to auscultation with normal expansion.  Cardiovascular exam is regular rate and rhythm.  Abdominal exam nontender or distended. No masses palpated. Extremities show no edema. neuro grossly intact  ECG-  personally reviewed  A/P  1  [name], MD

## 2017-06-12 ENCOUNTER — Telehealth: Payer: Self-pay | Admitting: Adult Health

## 2017-06-12 NOTE — Telephone Encounter (Signed)
Copied from [place] [id number]. Topic: Quick Communication - Rx Refill/Question >> Jun 12, 2017  4:35 PM Marin Olp L wrote: Medication: AVIVA ACCUCHECK SMARTVIEW TEST STRIPS  Has the patient contacted their pharmacy? Yes.   (Agent: If no, request that the patient contact the pharmacy for the refill.) (Agent: If yes, when and what did the pharmacy advise?)  Preferred Pharmacy (with phone number or street name): [place], [place] [address] Phone: [phone] Fax: [phone]  Agent: Please be advised that RX refills may take up to 3 business days. We ask that you follow-up with your pharmacy.  [employer] calling because the script needs to be renewed. Fax: [phone] The member ID number needs to be on the request as well as DOB and shipping address.

## 2017-06-13 DIAGNOSIS — H401131 Primary open-angle glaucoma, bilateral, mild stage: Secondary | ICD-10-CM | POA: Diagnosis not present

## 2017-06-13 MED ORDER — ACCU-CHEK SMARTVIEW VI STRP: ORAL_STRIP | 3 refills | Status: DC

## 2017-06-13 NOTE — Telephone Encounter (Signed)
Aviva Accucheck Smartview Test strips   [employer] needs script to be renewed.   See attached.   Thanks.  Pt of [employer]

## 2017-06-13 NOTE — Telephone Encounter (Signed)
Sent to the pharmacy by e-scribe. 

## 2017-06-14 ENCOUNTER — Ambulatory Visit: Payer: Medicare HMO | Admitting: Cardiology

## 2017-06-20 ENCOUNTER — Encounter: Payer: Self-pay | Admitting: Physician Assistant

## 2017-06-20 ENCOUNTER — Ambulatory Visit: Payer: Medicare HMO | Admitting: Physician Assistant

## 2017-06-20 ENCOUNTER — Ambulatory Visit
Admission: RE | Admit: 2017-06-20 | Discharge: 2017-06-20 | Disposition: A | Discharge status: Home / Self Care | Payer: Medicare HMO | Source: Ambulatory Visit | Attending: Physician Assistant | Admitting: Physician Assistant

## 2017-06-20 VITALS — BP 122/60 | HR 70 | Ht 61.0 in | Wt 225.0 lb

## 2017-06-20 DIAGNOSIS — E119 Type 2 diabetes mellitus without complications: Secondary | ICD-10-CM | POA: Diagnosis not present

## 2017-06-20 DIAGNOSIS — I480 Paroxysmal atrial fibrillation: Secondary | ICD-10-CM

## 2017-06-20 DIAGNOSIS — J986 Disorders of diaphragm: Secondary | ICD-10-CM | POA: Diagnosis not present

## 2017-06-20 DIAGNOSIS — R058 Other specified cough: Secondary | ICD-10-CM

## 2017-06-20 DIAGNOSIS — R05 Cough: Secondary | ICD-10-CM

## 2017-06-20 DIAGNOSIS — Z9981 Dependence on supplemental oxygen: Secondary | ICD-10-CM

## 2017-06-20 DIAGNOSIS — J449 Chronic obstructive pulmonary disease, unspecified: Secondary | ICD-10-CM | POA: Diagnosis not present

## 2017-06-20 MED ORDER — DILTIAZEM HCL ER COATED BEADS 240 MG PO CP24: 240.0000 mg | ORAL_CAPSULE | Freq: Every day | ORAL | 3 refills | Status: DC

## 2017-06-20 NOTE — Patient Instructions (Signed)
Medication Instructions: INCREASE the Diltiazem to 240 mg daily  If you need a refill on your cardiac medications before your next appointment, please call your pharmacy.    Procedures/Testing: A chest x-ray takes a picture of the organs and structures inside the chest, including the heart, lungs, and blood vessels. This test can show several things, including, whether the heart is enlarges; whether fluid is building up in the lungs; and whether pacemaker / defibrillator leads are still in place. This can be done at [facility].   Follow-Up: Your physician wants you to follow-up in 4 months with Dr. [name]. You will receive a reminder letter in the mail two months in advance. If you don't receive a letter, please call our office at [phone] to schedule this follow-up appointment.   Thank you for choosing [facility] at [facility]!!

## 2017-06-20 NOTE — Progress Notes (Signed)
Cardiology Office Note    Date:  06/20/2017   ID:  Becky Newton, DOB 01-11-1934, MRN [id number]  PCP:  [name], NP  Cardiologist:  Dr. [name] (last visit 11/03/2014)   Chief Complaint  Patient presents with  . Follow-up    seen for Dr. [name], PAF    History of Present Illness:  Becky Newton is a 84 y.o. female with PMH of atrial fibrillation, DM 2, GERD, COPD on 2 L pm, paralyzed left hemidiaphragm, and history of uterine cancer.  Patient was last seen by Dr. [name] in October 2016.  Prior to that she was admitted to [facility] with new onset of atrial fibrillation with RVR.  She converted to sinus rhythm with Cardizem.  She was ultimately discharged on Toprol-XL and Xarelto.  TSH was normal.  Echocardiogram in [place] obtained in September 2016 showed normal LV function, grade 2 DD, moderate LAE, mild tricuspid regurgitation and trace aortic insufficiency.  Chest x-ray obtained in September 2016 showed probable edema and mild effusion.  During her last office visit she was doing very well, although she did have chest pain during her atrial fibrillation.  Dr. [name] recommended outpatient stress test to rule out ischemia.  She was readmitted in February 2017 with recurrent atrial fibrillation.  Since 2016, she has failed to follow-up with cardiology service.  She was admitted in February 2019 with chest pain.  Her heart rate was tachycardic prior to arrival.  EKG obtained in the ED showed she has Artie converted to sinus rhythm.  She also has chest pressure in the setting of atrial fibrillation with RVR.  Echocardiogram obtained on 03/08/2017 showed EF 60 to 65%, mild LAE, PA peak pressure 88 mmHg.  I last saw the patient on 03/27/2017, she was doing well at the time.  She was on 24 7 at home oxygen, most of the time she used 2 L with occasional use of 3 L.  I recommended a 24-hour Holter monitor to check daily heart rate variations.  Holter monitor obtained on  04/11/2017 showed a sinus rhythm with occasional PACs and PVCs however no significant bradycardia.  Myoview obtained on 04/26/2017 was low risk study, EF 73%, no significant ischemia or scar.  Recent hemoglobin A1c obtained on 06/06/2017 was 5.9 after treated with metformin.  Patient presents today for follow-up.  She denies any recent palpitation or chest discomfort.  She is accompanied by her husband.  Since there is no significant bradycardia on recent monitor, I will increase her diltiazem to 240 mg daily.  She has been dealing with a cough for the past month.  It is nonproductive.  She denies any recent fever or chills.  Given her chronic COPD with oxygen need and a paralyzed left hemidiaphragm, she is at high risk for pulmonary infection.  I will obtain a two-view chest x-ray.  She is aware that if chest x-ray is abnormal, she will need to see her primary care provider for additional work-up and treatment.  Otherwise she can follow-up with Dr. [name].  Overall she is euvolemic on exam with no lower extremity edema.  Her lung does not pass breath sound very well, this is consistent with chronic COPD.   Past Medical History:  Diagnosis Date  . A-fib ([place])   . Atrial fibrillation ([place])   . Chicken pox   . COPD (chronic obstructive pulmonary disease) ([place])   . Diabetes ([place])   . Fibrocystic breast determined by biopsy 1977  . GERD (gastroesophageal  reflux disease)   . Glaucoma   . H/O hernia repair 2006  . H/O left breast biopsy 1982  . Incisional hernia   . Lung disease    Paralyzed left hemidiaphragm  . S/P scar revision   . Uterine cancer [facility])     Past Surgical History:  Procedure Laterality Date  . APPENDECTOMY  1966  . BREAST BIOPSY    . CATARACT EXTRACTION  2004,2005  . CHOLECYSTECTOMY  1975  . HERNIA REPAIR  2006  . HIATAL HERNIA REPAIR  1966  . NISSEN FUNDOPLICATION    . TOTAL ABDOMINAL HYSTERECTOMY W/ BILATERAL SALPINGOOPHORECTOMY  1974   hx of cancer     Current  Medications: Outpatient Medications Prior to Visit  Medication Sig Dispense Refill  . ACCU-CHEK SMARTVIEW test strip USE TO TEST BLOOD GLUCOSE 1-2 TIMES DAILY 200 each 3  . AMBULATORY NON FORMULARY MEDICATION Accu-Chek FastClix Lancets Drum. Use to check blood sugar twice a day. Type 2 diabetes. 5 Units 11  . AMBULATORY NON FORMULARY MEDICATION Accu-Chek SmartView glucose testing strips. Use to check blood sugar twice a day.  Type 2 diabetes. 100 Units 11  . benzonatate (TESSALON) 200 MG capsule Take 1 capsule (200 mg total) by mouth 3 (three) times daily as needed for cough. 20 capsule 1  . cholecalciferol (VITAMIN D) 1000 units tablet Take 1,000 Units by mouth daily.    . cyclobenzaprine (FLEXERIL) 5 MG tablet Take 1 tablet (5 mg total) by mouth at bedtime. 5 tablet 1  . furosemide (LASIX) 20 MG tablet Take 1 tablet (20 mg total) by mouth 2 (two) times daily. (Patient taking differently: Take 20 mg by mouth daily. ) 180 tablet 1  . Glucosamine-Chondroit-Vit C-Mn (GLUCOSAMINE CHONDR 500 COMPLEX PO) Take by mouth.    . hydrochlorothiazide (MICROZIDE) 12.5 MG capsule TAKE 1 CAPSULE EVERY DAY 90 capsule 1  . ipratropium-albuterol (DUONEB) 0.5-2.5 (3) MG/3ML SOLN Take 3 mLs by nebulization every 4 (four) hours as needed. 360 mL 3  . latanoprost (XALATAN) 0.005 % ophthalmic solution Place 1 drop into both eyes at bedtime.    . Lutein ([employer] LUTEIN) 40 MG CAPS Take 1 capsule by mouth daily.    . metFORMIN (GLUCOPHAGE) 500 MG tablet Take 500 mg by mouth 2 (two) times daily with a meal.    . metoprolol succinate (TOPROL-XL) 25 MG 24 hr tablet Take 1 tablet (25 mg total) by mouth daily. 14 tablet 0  . Multiple Vitamins-Minerals (PRESERVISION AREDS PO) Take 1 capsule by mouth 2 (two) times daily. Areds Preservision    . omeprazole (PRILOSEC) 20 MG capsule Take 1 capsule (20 mg total) by mouth 2 (two) times daily before a meal. 180 capsule 3  . ondansetron (ZOFRAN) 4 MG tablet Take 1 tablet (4 mg total) by  mouth every 6 (six) hours as needed for nausea or vomiting. 20 tablet 0  . OXYGEN Inhale 2 L into the lungs.    . potassium chloride (KLOR-CON 10) 10 MEQ tablet Take 1 tablet (10 mEq total) by mouth daily. 90 tablet 1  . rivaroxaban (XARELTO) 20 MG TABS tablet Take 1 tablet (20 mg total) by mouth daily with supper. 90 tablet 3  . VENTOLIN HFA 108 (90 Base) MCG/ACT inhaler Inhale 2 puffs into the lungs every 4 (four) hours as needed for wheezing or shortness of breath. 3 Inhaler 0  . diltiazem (CARDIZEM CD) 180 MG 24 hr capsule Take 1 capsule (180 mg total) by mouth daily. 90 capsule 3  No facility-administered medications prior to visit.      Allergies:   Ivp dye [iodinated diagnostic agents]   Social History   Socioeconomic History  . Marital status: Married    Spouse name: Not on file  . Number of children: Not on file  . Years of education: Not on file  . Highest education level: Not on file  Occupational History  . Not on file  Social Needs  . Financial resource strain: Not on file  . Food insecurity:    Worry: Not on file    Inability: Not on file  . Transportation needs:    Medical: Not on file    Non-medical: Not on file  Tobacco Use  . Smoking status: Former Smoker    Packs/day: 0.50    Years: 20.00    Pack years: 10.00    Types: Cigarettes    Last attempt to quit: 08/23/1989    Years since quitting: 27.8  . Smokeless tobacco: Never Used  Substance and Sexual Activity  . Alcohol use: No    Alcohol/week: 0.0 oz  . Drug use: No  . Sexual activity: Never    Partners: Male  Lifestyle  . Physical activity:    Days per week: Not on file    Minutes per session: Not on file  . Stress: Not on file  Relationships  . Social connections:    Talks on phone: Not on file    Gets together: Not on file    Attends religious service: Not on file    Active member of club or organization: Not on file    Attends meetings of clubs or organizations: Not on file    Relationship  status: Not on file  Other Topics Concern  . Not on file  Social History Narrative  . Not on file     Family History:  The patient's family history includes Alcohol abuse in her brother; Arthritis in her mother; Breast cancer in her mother; Heart attack in her mother; Heart disease in her mother; Stroke in her mother.   ROS:   Please see the history of present illness.    ROS All other systems reviewed and are negative.   PHYSICAL EXAM:   VS:  BP 122/60   Pulse 70   Ht _0  (1.549 m)   Wt 225 lb (102.1 kg)   SpO2 96%   BMI 42.51 kg/m    GEN: Well nourished, well developed, in no acute distress  On home O2 HEENT: normal  Neck: no JVD, carotid bruits, or masses Cardiac: RRR; no murmurs, rubs, or gallops,no edema  Respiratory: Diminished breath sounds throughout GI: soft, nontender, nondistended, + BS MS: no deformity or atrophy  Skin: warm and dry, no rash Neuro:  Alert and Oriented x 3, Strength and sensation are intact Psych: euthymic mood, full affect  Wt Readings from Last 3 Encounters:  06/20/17 225 lb (102.1 kg)  05/15/17 200 lb (90.7 kg)  04/25/17 226 lb (102.5 kg)      Studies/Labs Reviewed:   EKG:  EKG is not ordered today.   Recent Labs: 03/08/2017: ALT 12; Hemoglobin 13.0; Magnesium 1.7; Platelets 223 03/13/2017: BUN 28; Creatinine, Ser 1.09; Potassium 3.7; Sodium 143   Lipid Panel    Component Value Date/Time   CHOL 164 07/08/2014   TRIG 162 (A) 07/08/2014   HDL 43 07/08/2014   LDLCALC 89 07/08/2014    Additional studies/ records that were reviewed today include:   Echo 03/08/2017 LV  EF: 60% -   65% Study Conclusions  - Left ventricle: The cavity size was normal. Wall thickness was   normal. Systolic function was normal. The estimated ejection   fraction was in the range of 60% to 65%. Wall motion was normal;   there were no regional wall motion abnormalities. - Aortic valve: There was mild regurgitation. - Left atrium: The atrium was  mildly dilated. - Right atrium: The atrium was mildly dilated. - Atrial septum: A patent foramen ovale cannot be excluded. - Pulmonary arteries: PA peak pressure: 88 mm Hg (S).    ASSESSMENT:    1. PAF (paroxysmal atrial fibrillation) ([place])   2. Dry cough   3. Chronic obstructive pulmonary disease, unspecified COPD type ([place])   4. On home O2   5. Controlled type 2 diabetes mellitus without complication, without long-term current use of insulin (HCC)   6. Paralyzed hemidiaphragm      PLAN:  In order of problems listed above:  1. PAF: Continue Xarelto, we have given her some samples as she is currently in the insurance donut hole.  She is rate controlled on both metoprolol and diltiazem.  I am hesitant to increase metoprolol due to her chronic pulmonary issue.  I will however increase her diltiazem to 240 mg daily.  2. Dry cough: This has been going on for the past month, she is not on any ACE inhibitor.  She is prone to pulmonary infection due to her chronic COPD, I will obtain a two-view chest x-ray to assess.  She denies any fever or chill  3. COPD: Likely contributed to her elevated PA peak pressure on recent echocardiogram, she is on 2 L 24/7 at home O2   4. DM 2: On metformin, recent hemoglobin A1c improved.  5. Left hemidiaphragm paralysis: Chronic, although she did have a sore spot on the left flank, this is likely musculoskeletal in nature as it is reproducible with light palpation    Medication Adjustments/Labs and Tests Ordered: Current medicines are reviewed at length with the patient today.  Concerns regarding medicines are outlined above.  Medication changes, Labs and Tests ordered today are listed in the Patient Instructions below. Patient Instructions  Medication Instructions: INCREASE the Diltiazem to 240 mg daily  If you need a refill on your cardiac medications before your next appointment, please call your pharmacy.    Procedures/Testing: A chest x-ray  takes a picture of the organs and structures inside the chest, including the heart, lungs, and blood vessels. This test can show several things, including, whether the heart is enlarges; whether fluid is building up in the lungs; and whether pacemaker / defibrillator leads are still in place. This can be done at [facility].   Follow-Up: Your physician wants you to follow-up in 4 months with Dr. [name]. You will receive a reminder letter in the mail two months in advance. If you don't receive a letter, please call our office at [phone] to schedule this follow-up appointment.   Thank you for choosing [facility], [place], [place]  06/20/2017 9:20 AM    [address], [place], [place]  [address] Phone: [phone]; Fax: [phone]

## 2017-06-21 NOTE — Progress Notes (Signed)
No acute process was seen to explain the cough, some chronic stable changes.

## 2017-06-24 ENCOUNTER — Encounter: Payer: Self-pay | Admitting: Physician Assistant

## 2017-06-25 ENCOUNTER — Encounter: Payer: Self-pay | Admitting: Family Medicine

## 2017-06-25 ENCOUNTER — Telehealth: Payer: Self-pay | Admitting: Physician Assistant

## 2017-06-25 NOTE — Telephone Encounter (Signed)
Spoke with Becky Newton who states she wanted to make a sooner appointment with Dr. [name] to discuss changing xarelto due to the expenses and to see if she is a candidate for a watchman device. Appointment made for 07/08/17  At 840 am with Dr. [name].

## 2017-06-25 NOTE — Telephone Encounter (Signed)
New Message    Pt c/o medication issue:  1. Name of Medication: Xarelto  2. How are you currently taking this medication (dosage and times per day)?   3. Are you having a reaction (difficulty breathing--STAT)?   4. What is your medication issue? Patient states that the Xarelto is too expensive is requesting to try something else. Please call.

## 2017-07-01 DIAGNOSIS — H401111 Primary open-angle glaucoma, right eye, mild stage: Secondary | ICD-10-CM | POA: Diagnosis not present

## 2017-07-04 ENCOUNTER — Ambulatory Visit: Payer: Medicare HMO | Admitting: Adult Health

## 2017-07-04 DIAGNOSIS — J449 Chronic obstructive pulmonary disease, unspecified: Secondary | ICD-10-CM | POA: Diagnosis not present

## 2017-07-07 ENCOUNTER — Encounter: Payer: Self-pay | Admitting: Adult Health

## 2017-07-08 ENCOUNTER — Ambulatory Visit: Payer: Medicare HMO | Admitting: Cardiology

## 2017-07-08 ENCOUNTER — Encounter: Payer: Self-pay | Admitting: Cardiology

## 2017-07-08 VITALS — BP 122/58 | HR 72 | Ht 61.0 in | Wt 225.0 lb

## 2017-07-08 DIAGNOSIS — I480 Paroxysmal atrial fibrillation: Secondary | ICD-10-CM

## 2017-07-08 DIAGNOSIS — R0600 Dyspnea, unspecified: Secondary | ICD-10-CM | POA: Diagnosis not present

## 2017-07-08 NOTE — Patient Instructions (Signed)
Your physician wants you to follow-up in: [place] will receive a reminder letter in the mail two months in advance. If you don't receive a letter, please call our office to schedule the follow-up appointment.   If you need a refill on your cardiac medications before your next appointment, please call your pharmacy.

## 2017-07-08 NOTE — Progress Notes (Signed)
HPI: Follow-up atrial fibrillation.  Last echocardiogram February 2019 showed ejection fraction 60 to 65% and mild left atrial enlargement.  Holter monitor March 2019 showed sinus rhythm with occasional PAC and PVC.  Nuclear study April 2019 showed ejection fraction 73% and no ischemia or infarction.  Patient also with history of chronic COPD and paralyzed left hemidiaphragm.  Since last seen she has chronic dyspnea unchanged.  No chest pain, bleeding or syncope.  Occasional brief palpitations but she has not had any episodes of atrial fibrillation by report.  Current Outpatient Medications  Medication Sig Dispense Refill  . ACCU-CHEK SMARTVIEW test strip USE TO TEST BLOOD GLUCOSE 1-2 TIMES DAILY 200 each 3  . AMBULATORY NON FORMULARY MEDICATION Accu-Chek FastClix Lancets Drum. Use to check blood sugar twice a day. Type 2 diabetes. 5 Units 11  . AMBULATORY NON FORMULARY MEDICATION Accu-Chek SmartView glucose testing strips. Use to check blood sugar twice a day.  Type 2 diabetes. 100 Units 11  . benzonatate (TESSALON) 200 MG capsule Take 1 capsule (200 mg total) by mouth 3 (three) times daily as needed for cough. 20 capsule 1  . cholecalciferol (VITAMIN D) 1000 units tablet Take 1,000 Units by mouth daily.    . cyclobenzaprine (FLEXERIL) 5 MG tablet Take 1 tablet (5 mg total) by mouth at bedtime. 5 tablet 1  . diltiazem (CARDIZEM CD) 240 MG 24 hr capsule Take 1 capsule (240 mg total) by mouth daily. 90 capsule 3  . furosemide (LASIX) 20 MG tablet Take 1 tablet (20 mg total) by mouth 2 (two) times daily. (Patient taking differently: Take 20 mg by mouth daily. ) 180 tablet 1  . Glucosamine-Chondroit-Vit C-Mn (GLUCOSAMINE CHONDR 500 COMPLEX PO) Take by mouth.    . hydrochlorothiazide (MICROZIDE) 12.5 MG capsule TAKE 1 CAPSULE EVERY DAY 90 capsule 1  . ipratropium-albuterol (DUONEB) 0.5-2.5 (3) MG/3ML SOLN Take 3 mLs by nebulization every 4 (four) hours as needed. 360 mL 3  . latanoprost  (XALATAN) 0.005 % ophthalmic solution Place 1 drop into both eyes at bedtime.    . Lutein ([employer]S LUTEIN) 40 MG CAPS Take 1 capsule by mouth daily.    . metFORMIN (GLUCOPHAGE) 500 MG tablet Take 500 mg by mouth 2 (two) times daily with a meal.    . metoprolol succinate (TOPROL-XL) 25 MG 24 hr tablet Take 1 tablet (25 mg total) by mouth daily. 14 tablet 0  . Multiple Vitamins-Minerals (PRESERVISION AREDS PO) Take 1 capsule by mouth 2 (two) times daily. Areds Preservision    . omeprazole (PRILOSEC) 20 MG capsule Take 1 capsule (20 mg total) by mouth 2 (two) times daily before a meal. 180 capsule 3  . ondansetron (ZOFRAN) 4 MG tablet Take 1 tablet (4 mg total) by mouth every 6 (six) hours as needed for nausea or vomiting. 20 tablet 0  . OXYGEN Inhale 2 L into the lungs.    . potassium chloride (KLOR-CON 10) 10 MEQ tablet Take 1 tablet (10 mEq total) by mouth daily. 90 tablet 1  . rivaroxaban (XARELTO) 20 MG TABS tablet Take 1 tablet (20 mg total) by mouth daily with supper. 90 tablet 3  . VENTOLIN HFA 108 (90 Base) MCG/ACT inhaler Inhale 2 puffs into the lungs every 4 (four) hours as needed for wheezing or shortness of breath. 3 Inhaler 0   No current facility-administered medications for this visit.      Past Medical History:  Diagnosis Date  . A-fib ([place])   .  Atrial fibrillation ([place])   . Chicken pox   . COPD (chronic obstructive pulmonary disease) ([place])   . Diabetes ([place])   . Fibrocystic breast determined by biopsy 1977  . GERD (gastroesophageal reflux disease)   . Glaucoma   . H/O hernia repair 2006  . H/O left breast biopsy 1982  . Incisional hernia   . Lung disease    Paralyzed left hemidiaphragm  . S/P scar revision   . Uterine cancer [facility])     Past Surgical History:  Procedure Laterality Date  . APPENDECTOMY  1966  . BREAST BIOPSY    . CATARACT EXTRACTION  2004,2005  . CHOLECYSTECTOMY  1975  . HERNIA REPAIR  2006  . HIATAL HERNIA REPAIR  1966  . NISSEN FUNDOPLICATION      . TOTAL ABDOMINAL HYSTERECTOMY W/ BILATERAL SALPINGOOPHORECTOMY  1974   hx of cancer     Social History   Socioeconomic History  . Marital status: Married    Spouse name: Not on file  . Number of children: Not on file  . Years of education: Not on file  . Highest education level: Not on file  Occupational History  . Not on file  Social Needs  . Financial resource strain: Not on file  . Food insecurity:    Worry: Not on file    Inability: Not on file  . Transportation needs:    Medical: Not on file    Non-medical: Not on file  Tobacco Use  . Smoking status: Former Smoker    Packs/day: 0.50    Years: 20.00    Pack years: 10.00    Types: Cigarettes    Last attempt to quit: 08/23/1989    Years since quitting: 27.8  . Smokeless tobacco: Never Used  Substance and Sexual Activity  . Alcohol use: No    Alcohol/week: 0.0 oz  . Drug use: No  . Sexual activity: Never    Partners: Male  Lifestyle  . Physical activity:    Days per week: Not on file    Minutes per session: Not on file  . Stress: Not on file  Relationships  . Social connections:    Talks on phone: Not on file    Gets together: Not on file    Attends religious service: Not on file    Active member of club or organization: Not on file    Attends meetings of clubs or organizations: Not on file    Relationship status: Not on file  . Intimate partner violence:    Fear of current or ex partner: Not on file    Emotionally abused: Not on file    Physically abused: Not on file    Forced sexual activity: Not on file  Other Topics Concern  . Not on file  Social History Narrative  . Not on file    Family History  Problem Relation Age of Onset  . Breast cancer Mother   . Arthritis Mother   . Stroke Mother   . Heart attack Mother   . Heart disease Mother   . Alcohol abuse Brother     ROS: no fevers or chills, productive cough, hemoptysis, dysphasia, odynophagia, melena, hematochezia, dysuria, hematuria,  rash, seizure activity, claudication. Remaining systems are negative.  Physical Exam: Well-developed obese in no acute distress.  Skin is warm and dry.  HEENT is normal.  Neck is supple.  Chest is clear to auscultation with normal expansion.  Cardiovascular exam is regular rate and rhythm.  Abdominal exam nontender or distended. No masses palpated. Extremities show no edema. neuro grossly intact  ECG-normal sinus rhythm at a rate of 72.  No ST changes.  Personally reviewed  A/P  1 paroxysmal atrial fibrillation-patient remains in sinus rhythm on examination.  Continue metoprolol and Cardizem for rate control if atrial fibrillation recurs.  Continue Xarelto.  Patient is having difficulties affording Xarelto.  She may need to change to Coumadin.  She will contact us if necessary.  2 COPD-patient is on home oxygen.  Management per primary care.  3 diabetes mellitus-Per primary care.  4 paralyzed diaphragm  [name], MD

## 2017-07-09 MED ORDER — OMEPRAZOLE 20 MG PO CPDR: 20.0000 mg | DELAYED_RELEASE_CAPSULE | Freq: Two times a day (BID) | ORAL | 3 refills | Status: DC

## 2017-07-11 ENCOUNTER — Telehealth: Payer: Self-pay | Admitting: *Deleted

## 2017-07-11 NOTE — Telephone Encounter (Signed)
PA request received for cyclobenzaprine. Called patient to see if she still needs this med since it was prescribed over a month ago. Patient states she no longer needs the medication and never picked it up. Med removed from med list.

## 2017-07-15 DIAGNOSIS — H401131 Primary open-angle glaucoma, bilateral, mild stage: Secondary | ICD-10-CM | POA: Diagnosis not present

## 2017-07-22 ENCOUNTER — Telehealth: Payer: Self-pay | Admitting: Pulmonary Disease

## 2017-07-22 NOTE — Telephone Encounter (Signed)
Spoke with patient. She stated that she has been coughing up yellow phlegm for the past 4 days. OTC medications are not helping. She wishes to be seen in the office by a MD.   Spoke with Joellen Jersey and CY, they stated patient could be seen tomorrow at 230.   Spoke with patient again, she stated that that appt will be ok. She has been scheduled for 07/23/17 at 230. Nothing else needed at time of call.

## 2017-07-23 ENCOUNTER — Ambulatory Visit (INDEPENDENT_AMBULATORY_CARE_PROVIDER_SITE_OTHER)
Admission: RE | Admit: 2017-07-23 | Discharge: 2017-07-23 | Disposition: A | Discharge status: Home / Self Care | Payer: Medicare HMO | Source: Ambulatory Visit | Attending: Internal Medicine | Admitting: Internal Medicine

## 2017-07-23 ENCOUNTER — Ambulatory Visit: Payer: Medicare HMO | Admitting: Internal Medicine

## 2017-07-23 ENCOUNTER — Encounter: Payer: Self-pay | Admitting: Internal Medicine

## 2017-07-23 DIAGNOSIS — J9611 Chronic respiratory failure with hypoxia: Secondary | ICD-10-CM

## 2017-07-23 DIAGNOSIS — J441 Chronic obstructive pulmonary disease with (acute) exacerbation: Secondary | ICD-10-CM

## 2017-07-23 DIAGNOSIS — R0602 Shortness of breath: Secondary | ICD-10-CM

## 2017-07-23 DIAGNOSIS — R059 Cough, unspecified: Secondary | ICD-10-CM

## 2017-07-23 DIAGNOSIS — R05 Cough: Secondary | ICD-10-CM | POA: Diagnosis not present

## 2017-07-23 DIAGNOSIS — R062 Wheezing: Secondary | ICD-10-CM | POA: Diagnosis not present

## 2017-07-23 MED ORDER — IPRATROPIUM-ALBUTEROL 0.5-2.5 (3) MG/3ML IN SOLN: 3.0000 mL | RESPIRATORY_TRACT | 3 refills | Status: DC | PRN

## 2017-07-23 MED ORDER — BUDESONIDE-FORMOTEROL FUMARATE 160-4.5 MCG/ACT IN AERO: INHALATION_SPRAY | RESPIRATORY_TRACT | 4 refills | Status: DC

## 2017-07-23 MED ORDER — LEVALBUTEROL HCL 0.63 MG/3ML IN NEBU
0.6300 mg | INHALATION_SOLUTION | Freq: Once | RESPIRATORY_TRACT | Status: AC
  Administered 2017-07-23: 0.63 mg via RESPIRATORY_TRACT

## 2017-07-23 MED ORDER — ALBUTEROL SULFATE HFA 108 (90 BASE) MCG/ACT IN AERS: INHALATION_SPRAY | RESPIRATORY_TRACT | 4 refills | Status: DC

## 2017-07-23 MED ORDER — AZITHROMYCIN 250 MG PO TABS: ORAL_TABLET | ORAL | 0 refills | Status: DC

## 2017-07-23 NOTE — Progress Notes (Signed)
HPI  84 year old female former smoker seen December 27, 2016 to establish for chronic hypoxic respiratory failure.  Patient has been on chronic oxygen since 2013 and needed to establish with a local pulmonologist.   01/10/2017 Acute office visit Patient presents for a work in visit.  Patient complains that she has been having intermittent nosebleeds for the last year however last night had a very severe nosebleed.  It took a long time to get it to stop bleeding.  Patient is on Xarelto and oxygen.  Patient says she has not had any bleeding since 5 AM this morning.  She did apply pressure and put a cotton  ball she removed it 2 hours ago with no residual bleeding.  Patient is followed by ENT Dr. Carman Ching but has not made an appointment with them.  She denies any chest pain orthopnea PND hemoptysis.  07/23/2017-84 year old female former smoker followed by Dr [place] Bells for chronic hypoxic respiratory failure.  Last seen in December. Problem list includes A. fib, diastolic CHF, COPD, DM 2, glaucoma, GERD/ repair,, OSA, diaphragm paralysis (L), PHTN, hx uterine Ca O2  2L/ [employer] -----BQ patient Pt started coughing Sunday-productive yellow in color. Denies any fever but notes chills. Pt has started using her nebulizer machine.  Symbicort 160, Ventolin HFA, DuoNeb Acute visit-onset 3 days ago of malaise, cough with scant yellow sputum.  Denies fever, chills, GI upset or palpitation..  Complains Symbicort and Ventolin are too expensive. CXR 06/20/2017 IMPRESSION: Stable chronic changes particularly at the left lung base. No definite active process.  ROS-see HPI   + = positive Constitutional:    weight loss, night sweats, fevers, chills, fatigue, lassitude. HEENT:    headaches, difficulty swallowing, tooth/dental problems, sore throat,       sneezing, itching, ear ache, nasal congestion, post nasal drip, snoring CV:    chest pain, orthopnea, PND, swelling in lower extremities, anasarca,                                   dizziness, palpitations Resp:   +shortness of breath with exertion or at rest.                +productive cough,   non-productive cough, coughing up of blood.              +change in color of mucus.  wheezing.   Skin:    rash or lesions. GI:  No-   heartburn, indigestion, abdominal pain, nausea, vomiting, diarrhea,                 change in bowel habits, loss of appetite GU: dysuria, change in color of urine, no urgency or frequency.   flank pain. MS:   joint pain, stiffness, decreased range of motion, back pain. Neuro-     nothing unusual Psych:  change in mood or affect.  depression or anxiety.   memory loss.  OBJ- Physical Exam   + obese, + power scooter General- Alert, Oriented, Affect-appropriate, Distress- none acute Skin- rash-none, lesions- none, excoriation- none Lymphadenopathy- none Head- atraumatic            Eyes- Gross vision intact, PERRLA, conjunctivae and secretions clear            Ears- Hearing, canals-normal            Nose- Clear, no-Septal dev, mucus, polyps, erosion, perforation  Throat- Mallampati II , mucosa clear , drainage- none, tonsils- atrophic Neck- flexible , trachea midline, no stridor , thyroid nl, carotid no bruit Chest - symmetrical excursion , unlabored           Heart/CV- RRR , no murmur , no gallop  , no rub, nl s1 s2                           - JVD- none , edema- none, stasis changes- none, varices- none           Lung- clear to P&A, wheeze- none, cough+ light , dullness-none, rub- none           Chest wall-  Abd-  Br/ Gen/ Rectal- Not done, not indicated Extrem- cyanosis- none, clubbing, none, atrophy- none, strength- nl Neuro- grossly intact to observation

## 2017-07-23 NOTE — Patient Instructions (Addendum)
Scripts printed for your inhaled meds, changing Ventolin to Proair to see if it is cheaper for you.  We may be able to help you see if your [facility] insurance covers [place] or [place] cheaper than Symbicort  Script sent for Zpak antibiotic  Order- CXR    Dx exacerbation COPD mixed type  Order- neb Xop 0.63

## 2017-07-24 ENCOUNTER — Encounter: Payer: Self-pay | Admitting: Internal Medicine

## 2017-07-24 NOTE — Telephone Encounter (Signed)
Pt emailed through [employer] this morning asking for results of her CXR yesterday. Advised her the results have not been reviewed by CY at this time. Once they have been resulted a nurse will call with CY results and recommendations.  CY please advise.  Allergies  Allergen Reactions  . Ivp Dye [Iodinated Diagnostic Agents] Nausea And Vomiting   Current Outpatient Medications on File Prior to Visit  Medication Sig Dispense Refill  . ACCU-CHEK SMARTVIEW test strip USE TO TEST BLOOD GLUCOSE 1-2 TIMES DAILY 200 each 3  . albuterol (PROAIR HFA) 108 (90 Base) MCG/ACT inhaler Inhale 2 puffs every 4-6 hours as needed 3 Inhaler 4  . AMBULATORY NON FORMULARY MEDICATION Accu-Chek FastClix Lancets Drum. Use to check blood sugar twice a day. Type 2 diabetes. 5 Units 11  . AMBULATORY NON FORMULARY MEDICATION Accu-Chek SmartView glucose testing strips. Use to check blood sugar twice a day.  Type 2 diabetes. 100 Units 11  . azithromycin (ZITHROMAX) 250 MG tablet 2 today then one daily 6 tablet 0  . benzonatate (TESSALON) 200 MG capsule Take 1 capsule (200 mg total) by mouth 3 (three) times daily as needed for cough. 20 capsule 1  . budesonide-formoterol (SYMBICORT) 160-4.5 MCG/ACT inhaler Inhale 2 puffs, then rinse mouth, twice daily 3 Inhaler 4  . cholecalciferol (VITAMIN D) 1000 units tablet Take 1,000 Units by mouth daily.    Marland Kitchen diltiazem (CARDIZEM CD) 240 MG 24 hr capsule Take 1 capsule (240 mg total) by mouth daily. 90 capsule 3  . furosemide (LASIX) 20 MG tablet Take 1 tablet (20 mg total) by mouth 2 (two) times daily. (Patient taking differently: Take 20 mg by mouth daily. ) 180 tablet 1  . Glucosamine-Chondroit-Vit C-Mn (GLUCOSAMINE CHONDR 500 COMPLEX PO) Take by mouth.    . hydrochlorothiazide (MICROZIDE) 12.5 MG capsule TAKE 1 CAPSULE EVERY DAY 90 capsule 1  . ipratropium-albuterol (DUONEB) 0.5-2.5 (3) MG/3ML SOLN Take 3 mLs by nebulization every 4 (four) hours as needed. 360 mL 3  . latanoprost  (XALATAN) 0.005 % ophthalmic solution Place 1 drop into both eyes at bedtime.    . Lutein ([employer]S LUTEIN) 40 MG CAPS Take 1 capsule by mouth daily.    . metFORMIN (GLUCOPHAGE) 500 MG tablet Take 500 mg by mouth 2 (two) times daily with a meal.    . metoprolol succinate (TOPROL-XL) 25 MG 24 hr tablet Take 1 tablet (25 mg total) by mouth daily. 14 tablet 0  . Multiple Vitamins-Minerals (PRESERVISION AREDS PO) Take 1 capsule by mouth 2 (two) times daily. Areds Preservision    . omeprazole (PRILOSEC) 20 MG capsule Take 1 capsule (20 mg total) by mouth 2 (two) times daily before a meal. 180 capsule 3  . ondansetron (ZOFRAN) 4 MG tablet Take 1 tablet (4 mg total) by mouth every 6 (six) hours as needed for nausea or vomiting. 20 tablet 0  . OXYGEN Inhale 2 L into the lungs.    . potassium chloride (KLOR-CON 10) 10 MEQ tablet Take 1 tablet (10 mEq total) by mouth daily. 90 tablet 1  . rivaroxaban (XARELTO) 20 MG TABS tablet Take 1 tablet (20 mg total) by mouth daily with supper. 90 tablet 3   No current facility-administered medications on file prior to visit.

## 2017-07-24 NOTE — Assessment & Plan Note (Signed)
Acute exacerbation consistent with infection-likely viral with high potential to morph and it was sustained bacterial bronchitis/pneumonia.  She is worried she may have pneumonia. Plan-Z-Pak, nebulizer treatment Xopenex, refilled meds with discussion of how to review insurance formulary for cheaper alternatives.

## 2017-07-24 NOTE — Assessment & Plan Note (Signed)
She remains oxygen dependent, continuing 2 L.

## 2017-07-25 ENCOUNTER — Emergency Department (HOSPITAL_COMMUNITY): Payer: Medicare HMO

## 2017-07-25 ENCOUNTER — Emergency Department (HOSPITAL_COMMUNITY)
Admission: EM | Admit: 2017-07-25 | Discharge: 2017-07-25 | Disposition: A | Discharge status: Home / Self Care | Payer: Medicare HMO | Attending: Emergency Medicine | Admitting: Emergency Medicine

## 2017-07-25 ENCOUNTER — Encounter (HOSPITAL_COMMUNITY): Payer: Self-pay

## 2017-07-25 ENCOUNTER — Other Ambulatory Visit: Payer: Self-pay

## 2017-07-25 DIAGNOSIS — Z87891 Personal history of nicotine dependence: Secondary | ICD-10-CM | POA: Diagnosis not present

## 2017-07-25 DIAGNOSIS — Z8541 Personal history of malignant neoplasm of cervix uteri: Secondary | ICD-10-CM | POA: Diagnosis not present

## 2017-07-25 DIAGNOSIS — Z7984 Long term (current) use of oral hypoglycemic drugs: Secondary | ICD-10-CM | POA: Diagnosis not present

## 2017-07-25 DIAGNOSIS — E119 Type 2 diabetes mellitus without complications: Secondary | ICD-10-CM | POA: Insufficient documentation

## 2017-07-25 DIAGNOSIS — J441 Chronic obstructive pulmonary disease with (acute) exacerbation: Secondary | ICD-10-CM | POA: Insufficient documentation

## 2017-07-25 DIAGNOSIS — Z79899 Other long term (current) drug therapy: Secondary | ICD-10-CM | POA: Insufficient documentation

## 2017-07-25 DIAGNOSIS — I5032 Chronic diastolic (congestive) heart failure: Secondary | ICD-10-CM | POA: Diagnosis not present

## 2017-07-25 DIAGNOSIS — Z7901 Long term (current) use of anticoagulants: Secondary | ICD-10-CM | POA: Insufficient documentation

## 2017-07-25 DIAGNOSIS — R05 Cough: Secondary | ICD-10-CM | POA: Diagnosis not present

## 2017-07-25 DIAGNOSIS — R0602 Shortness of breath: Secondary | ICD-10-CM | POA: Diagnosis not present

## 2017-07-25 LAB — BASIC METABOLIC PANEL
Anion gap: 14 (ref 5–15)
BUN: 19 mg/dL (ref 8–23)
CALCIUM: 8.8 mg/dL — AB (ref 8.9–10.3)
CO2: 27 mmol/L (ref 22–32)
CREATININE: 1.06 mg/dL — AB (ref 0.44–1.00)
Chloride: 100 mmol/L (ref 98–111)
GFR calc non Af Amer: 48 mL/min — ABNORMAL LOW (ref >60)
GFR, EST AFRICAN AMERICAN: 56 mL/min — AB (ref >60)
Glucose, Bld: 118 mg/dL — ABNORMAL HIGH (ref 70–99)
Potassium: 4.3 mmol/L (ref 3.5–5.1)
SODIUM: 141 mmol/L (ref 135–145)

## 2017-07-25 LAB — CBC
HCT: 37.2 % (ref 36.0–46.0)
Hemoglobin: 11 g/dL — ABNORMAL LOW (ref 12.0–15.0)
MCH: 29.1 pg (ref 26.0–34.0)
MCHC: 29.6 g/dL — ABNORMAL LOW (ref 30.0–36.0)
MCV: 98.4 fL (ref 78.0–100.0)
PLATELETS: 189 10*3/uL (ref 150–400)
RBC: 3.78 MIL/uL — AB (ref 3.87–5.11)
RDW: 14.6 % (ref 11.5–15.5)
WBC: 6.9 10*3/uL (ref 4.0–10.5)

## 2017-07-25 LAB — I-STAT TROPONIN, ED: TROPONIN I, POC: 0.01 ng/mL (ref 0.00–0.08)

## 2017-07-25 MED ORDER — IPRATROPIUM-ALBUTEROL 0.5-2.5 (3) MG/3ML IN SOLN
3.0000 mL | Freq: Once | RESPIRATORY_TRACT | Status: AC
  Administered 2017-07-25: 3 mL via RESPIRATORY_TRACT
  Filled 2017-07-25: qty 3

## 2017-07-25 MED ORDER — PREDNISONE 10 MG PO TABS: 40.0000 mg | ORAL_TABLET | Freq: Every day | ORAL | 0 refills | Status: DC

## 2017-07-25 MED ORDER — ALBUTEROL SULFATE (2.5 MG/3ML) 0.083% IN NEBU
5.0000 mg | INHALATION_SOLUTION | Freq: Once | RESPIRATORY_TRACT | Status: AC
  Administered 2017-07-25: 5 mg via RESPIRATORY_TRACT
  Filled 2017-07-25: qty 6

## 2017-07-25 MED ORDER — METHYLPREDNISOLONE SODIUM SUCC 125 MG IJ SOLR
125.0000 mg | Freq: Once | INTRAMUSCULAR | Status: AC
  Administered 2017-07-25: 125 mg via INTRAVENOUS
  Filled 2017-07-25: qty 2

## 2017-07-25 MED ORDER — CEFTRIAXONE SODIUM 1 G IJ SOLR
1.0000 g | Freq: Once | INTRAMUSCULAR | Status: AC
  Administered 2017-07-25: 1 g via INTRAVENOUS
  Filled 2017-07-25: qty 10

## 2017-07-25 NOTE — ED Provider Notes (Signed)
[facility] EMERGENCY DEPARTMENT Provider Note   CSN: [id number] Arrival date & time: 07/25/17  1346      History   Chief Complaint Chief Complaint  Patient presents with  . Shortness of Breath    HPI Becky Newton is a 84 y.o. female.  The history is provided by the patient. No language interpreter was used.  Shortness of Breath    Becky Newton is a 84 y.o. female who presents to the Emergency Department complaining of sob. She presents from home accompanied by her husband for evaluation of shortness of breath and cough. She has been sick for the last four days with cough productive of yellow sputum and increased shortness of breath. She has left sided chest pain with coughing. Yesterday she had some nausea. No reports of fevers. She does have chronic lower extremity edema and this is unchanged from her baseline. She is on 2 to 3 L nasal cannula oxygen at baseline. Two days ago she saw pulmonary and was treated with a Z pack for COPD exacerbation. Today she presents due to worsening symptoms and difficulty with sleeping due to cough and shortness of breath. She does have a history of atrial fibrillation and takes as Xarelto. Past Medical History:  Diagnosis Date  . A-fib ([place])   . Atrial fibrillation ([place])   . Chicken pox   . COPD (chronic obstructive pulmonary disease) ([place])   . Diabetes ([place])   . Fibrocystic breast determined by biopsy 1977  . GERD (gastroesophageal reflux disease)   . Glaucoma   . H/O hernia repair 2006  . H/O left breast biopsy 1982  . Incisional hernia   . Lung disease    Paralyzed left hemidiaphragm  . S/P scar revision   . Uterine cancer [facility])     Patient Active Problem List   Diagnosis Date Noted  . S/p bilateral blepharoplasty 05/08/2017  . Atrial fibrillation with RVR ([place]) 03/09/2017  . Chronic diastolic CHF (congestive heart failure) ([place]) 03/09/2017  . Palpitation 03/09/2017  . COPD with acute exacerbation ([place])  08/22/2016  . Epistaxis 03/05/2016  . Hematoma of leg, right, initial encounter 11/29/2015  . Ischemic optic neuropathy of right eye 11/29/2015  . Vitreous degeneration, bilateral 11/29/2015  . Overactive bladder 08/19/2015  . Lower extremity edema 08/19/2015  . Stasis dermatitis 08/19/2015  . Paroxysmal atrial fibrillation ([place]) 11/03/2014  . Chest pain 11/03/2014  . Morbid obesity ([place]) 11/03/2014  . Chronic respiratory failure ([place]) 09/02/2014  . OSA (obstructive sleep apnea) 09/02/2014  . Hypercalcemia 09/02/2014  . Asthma, chronic 08/24/2014  . Type 2 diabetes mellitus without complication ([place]) 59/93/5701  . History of gastric ulcer 08/24/2014  . Paralysis, diaphragm 08/24/2014  . History of Nissen fundoplication 77/93/9030  . Pulmonary hypertension due to lung disease ([place]) 08/24/2014  . Open-angle glaucoma 08/24/2014    Past Surgical History:  Procedure Laterality Date  . APPENDECTOMY  1966  . BREAST BIOPSY    . CATARACT EXTRACTION  2004,2005  . CHOLECYSTECTOMY  1975  . HERNIA REPAIR  2006  . HIATAL HERNIA REPAIR  1966  . NISSEN FUNDOPLICATION    . TOTAL ABDOMINAL HYSTERECTOMY W/ BILATERAL SALPINGOOPHORECTOMY  1974   hx of cancer      OB History   None      Home Medications    Prior to Admission medications   Medication Sig Start Date End Date Taking? Authorizing Provider  albuterol (PROAIR HFA) 108 (90 Base) MCG/ACT inhaler Inhale 2 puffs  every 4-6 hours as needed 07/23/17  Yes Young, Clinton D, MD  azithromycin (ZITHROMAX) 250 MG tablet 2 today then one daily Patient taking differently: Take 250-500 mg by mouth See admin instructions. Take 2 tablets on Day 1, then 1 tablets daily for 4 additional days 07/23/17  Yes Young, Clinton D, MD  benzonatate (TESSALON) 200 MG capsule Take 1 capsule (200 mg total) by mouth 3 (three) times daily as needed for cough. 05/15/17  Yes Nafziger, Tommi Rumps, NP  budesonide-formoterol [facility]) 160-4.5 MCG/ACT inhaler Inhale 2 puffs,  then rinse mouth, twice daily 07/23/17  Yes Young, Clinton D, MD  cholecalciferol (VITAMIN D) 1000 units tablet Take 1,000 Units by mouth at bedtime.    Yes [provider]  diltiazem (CARDIZEM CD) 240 MG 24 hr capsule Take 1 capsule (240 mg total) by mouth daily. Patient taking differently: Take 240 mg by mouth at bedtime.  06/20/17  Yes Almyra Deforest, PA  furosemide (LASIX) 20 MG tablet Take 1 tablet (20 mg total) by mouth 2 (two) times daily. Patient taking differently: Take 20 mg by mouth daily.  12/25/16  Yes Nafziger, Tommi Rumps, NP  Glucosamine-Chondroit-Vit C-Mn (GLUCOSAMINE CHONDR 500 COMPLEX PO) Take 1 tablet by mouth daily.    Yes [provider]  hydrochlorothiazide (MICROZIDE) 12.5 MG capsule TAKE 1 CAPSULE EVERY DAY Patient taking differently: TAKE 1 CAPSULE EVERY DAY at noon 11/30/16  Yes Nafziger, Tommi Rumps, NP  ipratropium-albuterol (DUONEB) 0.5-2.5 (3) MG/3ML SOLN Take 3 mLs by nebulization every 4 (four) hours as needed. 07/23/17  Yes Young, Clinton D, MD  latanoprost (XALATAN) 0.005 % ophthalmic solution Place 1 drop into both eyes at bedtime.   Yes [provider]  Lutein ([employer]S LUTEIN) 40 MG CAPS Take 1 capsule by mouth daily.   Yes [provider]  metFORMIN (GLUCOPHAGE) 500 MG tablet Take 500 mg by mouth 2 (two) times daily with a meal.   Yes [provider]  metoprolol succinate (TOPROL-XL) 25 MG 24 hr tablet Take 1 tablet (25 mg total) by mouth daily. 11/15/16  Yes Nafziger, Tommi Rumps, NP  Multiple Vitamins-Minerals (PRESERVISION AREDS PO) Take 1 capsule by mouth 2 (two) times daily. Areds Preservision   Yes [provider]  omeprazole (PRILOSEC) 20 MG capsule Take 1 capsule (20 mg total) by mouth 2 (two) times daily before a meal. 07/09/17  Yes Nafziger, Tommi Rumps, NP  ondansetron (ZOFRAN) 4 MG tablet Take 1 tablet (4 mg total) by mouth every 6 (six) hours as needed for nausea or vomiting. 05/15/17  Yes Nafziger, Tommi Rumps, NP  OXYGEN Inhale 2 L into the  lungs.   Yes [provider]  potassium chloride (KLOR-CON 10) 10 MEQ tablet Take 1 tablet (10 mEq total) by mouth daily. 10/11/16  Yes Nafziger, Tommi Rumps, NP  rivaroxaban (XARELTO) 20 MG TABS tablet Take 1 tablet (20 mg total) by mouth daily with supper. 12/21/16  Yes Nafziger, Tommi Rumps, NP  ACCU-CHEK SMARTVIEW test strip USE TO TEST BLOOD GLUCOSE 1-2 TIMES DAILY 06/13/17   Nafziger, Tommi Rumps, NP  AMBULATORY NON FORMULARY MEDICATION Accu-Chek FastClix Lancets Drum. Use to check blood sugar twice a day. Type 2 diabetes. 06/14/15   Hommel, Hilliard Clark, DO  AMBULATORY NON FORMULARY MEDICATION Accu-Chek SmartView glucose testing strips. Use to check blood sugar twice a day.  Type 2 diabetes. 03/22/16   Breeback, Jade L, PA-C  predniSONE (DELTASONE) 10 MG tablet Take 4 tablets (40 mg total) by mouth daily. 07/25/17   Quintella Reichert, MD    Family History Family History  Problem Relation Age of Onset  . Breast cancer Mother   . Arthritis Mother   . Stroke Mother   . Heart attack Mother   . Heart disease Mother   . Alcohol abuse Brother     Social History Social History   Tobacco Use  . Smoking status: Former Smoker    Packs/day: 0.50    Years: 20.00    Pack years: 10.00    Types: Cigarettes    Last attempt to quit: 08/23/1989    Years since quitting: 27.9  . Smokeless tobacco: Never Used  Substance Use Topics  . Alcohol use: No    Alcohol/week: 0.0 oz  . Drug use: No     Allergies   Ivp dye [iodinated diagnostic agents]   Review of Systems Review of Systems  Respiratory: Positive for shortness of breath.   All other systems reviewed and are negative.    Physical Exam Updated Vital Signs BP (!) 120/57   Pulse 87   Resp 14   Ht _0  (1.549 m)   Wt 102.1 kg (225 lb)   SpO2 (!) 88%   BMI 42.51 kg/m   Physical Exam  Constitutional: She is oriented to person, place, and time. She appears well-developed and well-nourished.  HENT:  Head: Normocephalic and atraumatic.    Cardiovascular: Normal rate and regular rhythm.  No murmur heard. Pulmonary/Chest:  Tachypnea with diffuse wheezes and crackles, left greater than right. Frequent coughing.  Abdominal: Soft. There is no tenderness. There is no rebound and no guarding.  Musculoskeletal: She exhibits no tenderness.  Trace pitting edema to bilateral lower extremities  Neurological: She is alert and oriented to person, place, and time.  Skin: Skin is warm and dry.  Psychiatric: She has a normal mood and affect. Her behavior is normal.  Nursing note and vitals reviewed.    ED Treatments / Results  Labs (all labs ordered are listed, but only abnormal results are displayed) Labs Reviewed  BASIC METABOLIC PANEL - Abnormal; Notable for the following components:      Result Value   Glucose, Bld 118 (*)    Creatinine, Ser 1.06 (*)    Calcium 8.8 (*)    GFR calc non Af Amer 48 (*)    GFR calc Af Amer 56 (*)    All other components within normal limits  CBC - Abnormal; Notable for the following components:   RBC 3.78 (*)    Hemoglobin 11.0 (*)    MCHC 29.6 (*)    All other components within normal limits  I-STAT TROPONIN, ED    EKG EKG Interpretation  Date/Time:  Thursday July 25 2017 13:49:30 EDT Ventricular Rate:  98 PR Interval:  156 QRS Duration: 72 QT Interval:  322 QTC Calculation: 411 R Axis:   67 Text Interpretation:  Sinus rhythm with Possible Premature atrial complexes with Abberant conduction Otherwise normal ECG Confirmed by Quintella Reichert [phone]) on 07/25/2017 2:02:27 PM   Radiology Dg Chest 2 View  Result Date: 07/25/2017 CLINICAL DATA:  Pt c/o SOB since Sunday. Pt was seen at PCP and prescribed PO antibiotics and pt feels like they are not working. Pt endorse cough with productive yellow sputum. EXAM: CHEST - 2 VIEW COMPARISON:  07/23/2017 and multiple older exams. FINDINGS: Cardiac silhouette is mildly enlarged. No mediastinal or hilar masses. There is opacity on the left which  obscures most of the left heart border and portions of the left hemidiaphragm. This is similar to prior exams allowing for  differences patient positioning. There are prominent bronchovascular markings at the right lung base, also stable. No acute abnormality within the chest. No convincing pleural effusion or pneumothorax. Skeletal structures are demineralized but intact. IMPRESSION: 1. No acute cardiopulmonary disease. 2. Mild stable cardiomegaly. No change in the chronic opacity at the left lung base. Electronically Signed   By: Lajean Manes M.D.   On: 07/25/2017 15:19    Procedures Procedures (including critical care time)  Medications Ordered in ED Medications  cefTRIAXone (ROCEPHIN) 1 g in sodium chloride 0.9 % 100 mL IVPB (1 g Intravenous New Bag/Given 07/25/17 1539)  ipratropium-albuterol (DUONEB) 0.5-2.5 (3) MG/3ML nebulizer solution 3 mL (3 mLs Nebulization Given 07/25/17 1448)  methylPREDNISolone sodium succinate (SOLU-MEDROL) 125 mg/2 mL injection 125 mg (125 mg Intravenous Given 07/25/17 1448)  albuterol (PROVENTIL) (2.5 MG/3ML) 0.083% nebulizer solution 5 mg (5 mg Nebulization Given 07/25/17 1557)     Initial Impression / Assessment and Plan / ED Course  I have reviewed the triage vital signs and the nursing notes.  Pertinent labs & imaging results that were available during my care of the patient were reviewed by me and considered in my medical decision making (see chart for details).     Patient with history of COPD, atrial fibrillation on chronic anticoagulation here for evaluation of shortness of breath and cough productive of sputum for the last four days. She is to With wheezing on examination. She is feeling significantly improved following albuterol treatment times one as well as Solu-Medrol. On repeat lung assessment she has good air movement bilaterally but persistent and expiratory wheezes. Chest x-ray demonstrates stable cardiomegaly, no evidence of acute infiltrate or  pulmonary edema. Current presentation is not consistent with ACS, CHF, PE, pneumonia. Patient is setting between 92 and 95% at rest on her home supplemental oxygen. Discussed with patient admission for ongoing treatments given her symptoms, persistent wheezing and she declines. Plan to treat with steroids, continued oral antibiotics, continued albuterol treatments at home for COPD exacerbation. Counseled patient and husband on home care as well as close outpatient follow-up and return precautions.  Final Clinical Impressions(s) / ED Diagnoses   Final diagnoses:  COPD exacerbation [facility])    ED Discharge Orders        Ordered    predniSONE (DELTASONE) 10 MG tablet  Daily     07/25/17 1535       Quintella Reichert, MD 07/25/17 1610

## 2017-07-25 NOTE — ED Notes (Signed)
Patient verbalizes understanding of discharge instructions. Opportunity for questioning and answers were provided. Armband removed by staff, pt discharged from ED

## 2017-07-25 NOTE — ED Triage Notes (Signed)
Pt c/o SOB since Sunday. Pt was seen at PCP and prescribed PO antibiotics and pt feels like they are not working. Pt endorse cough with productive yellow sputum. Pt wear chronic O2 @ 2L, but has been wearing O2 @ 3L.

## 2017-07-25 NOTE — ED Notes (Signed)
Patient transported to X-ray

## 2017-07-26 ENCOUNTER — Encounter: Payer: Self-pay | Admitting: Adult Health

## 2017-07-27 ENCOUNTER — Encounter: Payer: Self-pay | Admitting: Adult Health

## 2017-07-29 ENCOUNTER — Ambulatory Visit: Payer: Medicare HMO | Admitting: Internal Medicine

## 2017-07-29 ENCOUNTER — Encounter: Payer: Self-pay | Admitting: Internal Medicine

## 2017-07-29 VITALS — BP 124/68 | HR 91 | Ht 61.5 in | Wt 250.0 lb

## 2017-07-29 DIAGNOSIS — J986 Disorders of diaphragm: Secondary | ICD-10-CM | POA: Diagnosis not present

## 2017-07-29 DIAGNOSIS — R05 Cough: Secondary | ICD-10-CM | POA: Diagnosis not present

## 2017-07-29 DIAGNOSIS — J9811 Atelectasis: Secondary | ICD-10-CM

## 2017-07-29 DIAGNOSIS — J441 Chronic obstructive pulmonary disease with (acute) exacerbation: Secondary | ICD-10-CM | POA: Diagnosis not present

## 2017-07-29 DIAGNOSIS — J9611 Chronic respiratory failure with hypoxia: Secondary | ICD-10-CM

## 2017-07-29 MED ORDER — AMOXICILLIN-POT CLAVULANATE 875-125 MG PO TABS: 1.0000 | ORAL_TABLET | Freq: Two times a day (BID) | ORAL | 0 refills | Status: DC

## 2017-07-29 MED ORDER — FLUTTER DEVI: 0 refills | Status: DC

## 2017-07-29 NOTE — Patient Instructions (Addendum)
Script printed for Flutter device-  Blow through 4 times per set, three times per day while needed to help clear secretions  Ok to reschedule August appointment to September  Order- CT chest, no contrast   Dx atelectasis left lung, bronchiectasis  Script sent for augmentin antibiotic

## 2017-07-29 NOTE — Progress Notes (Signed)
HPI   female former smoker seen December 27, 2016 to establish for chronic hypoxic respiratory failure.  Patient has been on chronic oxygen since 2013 and needed to establish with a local pulmonologist.   -----------------------------------------------------------------------  01/10/2017 Acute office visit Patient presents for a work in visit.  Patient complains that she has been having intermittent nosebleeds for the last year however last night had a very severe nosebleed.  It took a long time to get it to stop bleeding.  Patient is on Xarelto and oxygen.  Patient says she has not had any bleeding since 5 AM this morning.  She did apply pressure and put a cotton  ball she removed it 2 hours ago with no residual bleeding.  Patient is followed by ENT Dr. Carman Ching but has not made an appointment with them.  She denies any chest pain orthopnea PND hemoptysis.  07/23/2017-84 year old female former smoker followed by Dr Lake Bells for chronic hypoxic respiratory failure.  Last seen in December. Problem list includes A. fib, diastolic CHF, COPD, DM 2, glaucoma, GERD/ repair,, OSA, diaphragm paralysis (L), PHTN, hx uterine Ca O2  2L/ [employer] -----BQ patient Pt started coughing Sunday-productive yellow in color. Denies any fever but notes chills. Pt has started using her nebulizer machine.  Symbicort 160, Ventolin HFA, DuoNeb Acute visit-onset 3 days ago of malaise, cough with scant yellow sputum.  Denies fever, chills, GI upset or palpitation..  Complains Symbicort and Ventolin are too expensive. CXR 06/20/2017 IMPRESSION: Stable chronic changes particularly at the left lung base. No definite active process.  07/29/2017- 84 year old female former smoker followed by Dr Lake Bells for chronic hypoxic respiratory failure.  Problem list includes A. fib, diastolic CHF, COPD, DM 2, glaucoma, GERD/ repair, OSA, diaphragm paralysis (L), PHTN, hx uterine Ca O2  2L/ [employer] -----FOLLOWS FOR: Cough is not improved since  seeing Dr. Annamaria Boots on 07/23/17. ED visit 07/25/2017-for shortness of breath.  Wheezing responded to nebulizer, sent out taking prednisone 10 mg daily.  Given Rocephin.  Finished Z-Pak.  Symbicort 160, pro-air HFA, She and husband report breathing and dry cough have not significantly improved after Z-Pak And then treatment at ER.  We compared recent and prior CXR images looking at the atelectasis and left lower lung zone associated with elevated hemidiaphragm.  Most recent image was AP and not quite comparable.  Does not seem to be having fever or increased fluid retention. Labs 07/25/2017-troponin normal, WBC normal, hemoglobin 11, creatinine 1.06, glucose 118 CXR 07/25/2017- IMPRESSION: 1. No acute cardiopulmonary disease. 2. Mild stable cardiomegaly. No change in the chronic opacity at the left lung base.  ROS-see HPI   + = positive Constitutional:    weight loss, night sweats, fevers, chills, fatigue, lassitude. HEENT:    headaches, difficulty swallowing, tooth/dental problems, sore throat,       sneezing, itching, ear ache, nasal congestion, post nasal drip, snoring CV:    chest pain, orthopnea, PND, swelling in lower extremities, anasarca,                                  dizziness, palpitations Resp:   +shortness of breath with exertion or at rest.                +productive cough,   non-productive cough, coughing up of blood.              +change in color of mucus.  wheezing.   Skin:  rash or lesions. GI:  No-   heartburn, indigestion, abdominal pain, nausea, vomiting, diarrhea,                 change in bowel habits, loss of appetite GU: dysuria, change in color of urine, no urgency or frequency.   flank pain. MS:   joint pain, stiffness, decreased range of motion, back pain. Neuro-     nothing unusual Psych:  change in mood or affect.  depression or anxiety.   memory loss.  OBJ- Physical Exam   + obese, + power scooter, O2 90% sat General- Alert, Oriented, Affect-appropriate, Distress-  none acute Skin- rash-none, lesions- none, excoriation- none Lymphadenopathy- none Head- atraumatic            Eyes- Gross vision intact, PERRLA, conjunctivae and secretions clear            Ears- Hearing, canals-normal            Nose- Clear, no-Septal dev, mucus, polyps, erosion, perforation             Throat- Mallampati II , mucosa clear , drainage- none, tonsils- atrophic Neck- flexible , trachea midline, no stridor , thyroid nl, carotid no bruit Chest - symmetrical excursion , unlabored           Heart/CV- RRR , no murmur , no gallop  , no rub, nl s1 s2                           - JVD- none , edema- none, stasis changes- none, varices- none           Lung- + diminished left base, wheeze- none, cough+ active , dullness-none, rub- none           Chest wall-  Abd-  Br/ Gen/ Rectal- Not done, not indicated Extrem- cyanosis- none, clubbing, none, atrophy- none, strength- nl Neuro- grossly intact to observation

## 2017-07-30 ENCOUNTER — Other Ambulatory Visit: Payer: Self-pay | Admitting: Pulmonary Disease

## 2017-07-30 ENCOUNTER — Telehealth: Payer: Self-pay | Admitting: Pulmonary Disease

## 2017-07-30 DIAGNOSIS — R059 Cough, unspecified: Secondary | ICD-10-CM

## 2017-07-30 DIAGNOSIS — R062 Wheezing: Secondary | ICD-10-CM

## 2017-07-30 DIAGNOSIS — R0602 Shortness of breath: Secondary | ICD-10-CM

## 2017-07-30 DIAGNOSIS — R05 Cough: Secondary | ICD-10-CM

## 2017-07-30 DIAGNOSIS — J441 Chronic obstructive pulmonary disease with (acute) exacerbation: Secondary | ICD-10-CM

## 2017-07-30 DIAGNOSIS — J9611 Chronic respiratory failure with hypoxia: Secondary | ICD-10-CM

## 2017-07-30 MED ORDER — IPRATROPIUM-ALBUTEROL 0.5-2.5 (3) MG/3ML IN SOLN: 3.0000 mL | RESPIRATORY_TRACT | 3 refills | Status: DC | PRN

## 2017-07-30 MED ORDER — BUDESONIDE-FORMOTEROL FUMARATE 160-4.5 MCG/ACT IN AERO: INHALATION_SPRAY | RESPIRATORY_TRACT | 4 refills | Status: DC

## 2017-07-30 NOTE — Assessment & Plan Note (Signed)
Known history left hemidiaphragm paralysis.  Not clear how much of the opacity above her diaphragm is atelectasis versus pneumonia or other process. Plan-stepping up airway clearance procedures, now to include flutter device.  CT for better visualization.

## 2017-07-30 NOTE — Assessment & Plan Note (Signed)
Cannot exclude a pneumonia in the chronic left lower lung zone opacity.  We will try to improve airway clearance with a Flutter device.  She is allergic to IVP dye.  We will get CT without contrast looking for bronchiectasis that might qualify her for a pneumatic vest if needed.  CT will also better describe the left lower lung zone process. Plan-chest CT, Augmentin, flutter device.  Change August appointment to September.

## 2017-07-30 NOTE — Telephone Encounter (Signed)
Spoke with pt. She is needing her prescriptions for Symbicort and Duoneb sent to her local pharmacy. Rxs have been sent in. Nothing further was needed.

## 2017-07-30 NOTE — Assessment & Plan Note (Signed)
Oxygen.  At 2 L while awake saturation is 90%.

## 2017-07-30 NOTE — Telephone Encounter (Signed)
Called to speak with pt, pt stated to me she would rather only speak to [place] since she was working with CY when pt saw him at office 07/29/17.  Routing encounter to [place] for her to address pt's needs.

## 2017-07-31 ENCOUNTER — Telehealth: Payer: Self-pay | Admitting: Pulmonary Disease

## 2017-07-31 MED ORDER — ALBUTEROL SULFATE HFA 108 (90 BASE) MCG/ACT IN AERS: INHALATION_SPRAY | RESPIRATORY_TRACT | 5 refills | Status: DC

## 2017-07-31 NOTE — Telephone Encounter (Signed)
   Last ov 7.8.26 with CY  Called spoke with patient who verified medication needed and pharmacy  Did inform patient that this was last refilled yesterday #3 inhalers to [facility] - pt said not to worry about that, she has cancelled everything to [facility] and would [facility].  Verified with patient again that [facility] does NOT need to be called to cancel the Rx sent yesterday - it does not.  Patient would like a 1 month supply instead of 3 months.  [facility] [place] Nothing further needed at this time; will sign off.

## 2017-08-01 ENCOUNTER — Encounter: Payer: Self-pay | Admitting: Internal Medicine

## 2017-08-03 DIAGNOSIS — J449 Chronic obstructive pulmonary disease, unspecified: Secondary | ICD-10-CM | POA: Diagnosis not present

## 2017-08-07 ENCOUNTER — Telehealth: Payer: Self-pay | Admitting: Cardiology

## 2017-08-07 NOTE — Telephone Encounter (Signed)
New message    Patient calling to discuss assistance with Eliquis.  Pt c/o medication issue:  1. Name of Medication: rivaroxaban (XARELTO) 20 MG TABS tablet  2. How are you currently taking this medication (dosage and times per day)?   3. Are you having a reaction (difficulty breathing--STAT)? no  4. What is your medication issue? Too costly

## 2017-08-07 NOTE — Telephone Encounter (Signed)
Patient husband called in, stated that he spoke with Hilda Blades RN this morning regarding his patient assistance forms. He called the patient assistance line and they told him they did not have any form sent to them. They told him they were faxing over some paperwork for [place] to fill out, I looked at the back fax, and in Dr.Crenshaw's box but did not see any patient assistance forms.   Do you happen to have the paperwork from this patient assistance forms?

## 2017-08-08 ENCOUNTER — Ambulatory Visit (INDEPENDENT_AMBULATORY_CARE_PROVIDER_SITE_OTHER): Payer: Medicare HMO | Admitting: Adult Health

## 2017-08-08 ENCOUNTER — Ambulatory Visit (INDEPENDENT_AMBULATORY_CARE_PROVIDER_SITE_OTHER)
Admission: RE | Admit: 2017-08-08 | Discharge: 2017-08-08 | Disposition: A | Discharge status: Home / Self Care | Payer: Medicare HMO | Source: Ambulatory Visit | Attending: Internal Medicine | Admitting: Internal Medicine

## 2017-08-08 ENCOUNTER — Encounter: Payer: Self-pay | Admitting: Adult Health

## 2017-08-08 VITALS — BP 120/64 | HR 83 | Temp 97.9°F

## 2017-08-08 DIAGNOSIS — R0602 Shortness of breath: Secondary | ICD-10-CM

## 2017-08-08 DIAGNOSIS — J9811 Atelectasis: Secondary | ICD-10-CM | POA: Diagnosis not present

## 2017-08-08 DIAGNOSIS — R05 Cough: Secondary | ICD-10-CM | POA: Diagnosis not present

## 2017-08-08 DIAGNOSIS — J9691 Respiratory failure, unspecified with hypoxia: Secondary | ICD-10-CM | POA: Diagnosis not present

## 2017-08-08 DIAGNOSIS — R059 Cough, unspecified: Secondary | ICD-10-CM

## 2017-08-08 NOTE — Telephone Encounter (Signed)
Pt husband Herbie [place] walked into office for clarification on paperwork sent for Eliquis assistance as per yesterday the company was saying they had not received it. Told him I would look into it. In review it looks like the information was faxed on 07/27/2017 but when I called the company they did not get it and informed me they use a different form.   Phone: [phone]  Fax: [phone] Case #: [id number]  The representative faxed me another form to be completed and attach patient information. New application completed except provider signature and pt was given copy of first page to provide annual income along with bring back copy of W2.

## 2017-08-08 NOTE — Progress Notes (Signed)
Subjective:    Patient ID: Becky Newton, female    DOB: 03-06-1933, 84 y.o.   MRN: [id number]  HPI  84 year old female who  has a past medical history of A-fib ([place]), Atrial fibrillation ([place]), Chicken pox, COPD (chronic obstructive pulmonary disease) ([place]), Diabetes ([place]), Fibrocystic breast determined by biopsy (1977), GERD (gastroesophageal reflux disease), Glaucoma, H/O hernia repair (2006), H/O left breast biopsy (1982), Incisional hernia, Lung disease, S/P scar revision, and Uterine cancer ([place]).  She presents to the office today for follow up after ER visit on 07/25/2017 for COPD exacerbation. She was seen by pulmonary on 07/23/2017 complaint of productive cough that had not improved despite using nebulizer, Symbicort and Ventolin.  She denies any fevers but noted chills.  Did not have any GI upset or palpitations.  This time she was started on Z-Pak which did not help with her symptoms she presented to the emergency room.  In the emergency room she was given 1 g of IM Rocephin, Solu-Medrol 125 mg IV fusion, nebulizer treatments.  Chest CT demonstrated stable cardiomegaly, no evidence of acute infiltrate or pulmonary edema.  She was satting between 90 to 90% at rest on supplemental oxygen.  She presented back to pulmonary on 07/29/2017 for continued shortness of breath and dry cough.  He was prescribed a flutter device and CT was ordered for better visualization.  She has her CT at 1:00 this afternoon.  Today in the office she reports that she continues to have shortness of breath chronic dry cough.  She reports that she has had to sleep upright at night due to the shortness of breath and cough.  She continues to deny any fevers, chills, nausea, or vomiting.  But does feel fatigued from the cough not being able to sleep.  Review of Systems See HPI   Past Medical History:  Diagnosis Date  . A-fib ([place])   . Atrial fibrillation ([place])   . Chicken pox   . COPD (chronic obstructive pulmonary  disease) ([place])   . Diabetes ([place])   . Fibrocystic breast determined by biopsy 1977  . GERD (gastroesophageal reflux disease)   . Glaucoma   . H/O hernia repair 2006  . H/O left breast biopsy 1982  . Incisional hernia   . Lung disease    Paralyzed left hemidiaphragm  . S/P scar revision   . Uterine cancer [facility])     Social History   Socioeconomic History  . Marital status: Married    Spouse name: Not on file  . Number of children: Not on file  . Years of education: Not on file  . Highest education level: Not on file  Occupational History  . Not on file  Social Needs  . Financial resource strain: Not on file  . Food insecurity:    Worry: Not on file    Inability: Not on file  . Transportation needs:    Medical: Not on file    Non-medical: Not on file  Tobacco Use  . Smoking status: Former Smoker    Packs/day: 0.50    Years: 20.00    Pack years: 10.00    Types: Cigarettes    Last attempt to quit: 08/23/1989    Years since quitting: 27.9  . Smokeless tobacco: Never Used  Substance and Sexual Activity  . Alcohol use: No    Alcohol/week: 0.0 oz  . Drug use: No  . Sexual activity: Never    Partners: Male  Lifestyle  . Physical  activity:    Days per week: Not on file    Minutes per session: Not on file  . Stress: Not on file  Relationships  . Social connections:    Talks on phone: Not on file    Gets together: Not on file    Attends religious service: Not on file    Active member of club or organization: Not on file    Attends meetings of clubs or organizations: Not on file    Relationship status: Not on file  . Intimate partner violence:    Fear of current or ex partner: Not on file    Emotionally abused: Not on file    Physically abused: Not on file    Forced sexual activity: Not on file  Other Topics Concern  . Not on file  Social History Narrative  . Not on file    Past Surgical History:  Procedure Laterality Date  . APPENDECTOMY  1966  . BREAST  BIOPSY    . CATARACT EXTRACTION  2004,2005  . CHOLECYSTECTOMY  1975  . HERNIA REPAIR  2006  . HIATAL HERNIA REPAIR  1966  . NISSEN FUNDOPLICATION    . TOTAL ABDOMINAL HYSTERECTOMY W/ BILATERAL SALPINGOOPHORECTOMY  1974   hx of cancer     Family History  Problem Relation Age of Onset  . Breast cancer Mother   . Arthritis Mother   . Stroke Mother   . Heart attack Mother   . Heart disease Mother   . Alcohol abuse Brother     Allergies  Allergen Reactions  . Ivp Dye [Iodinated Diagnostic Agents] Nausea And Vomiting    Current Outpatient Medications on File Prior to Visit  Medication Sig Dispense Refill  . ACCU-CHEK SMARTVIEW test strip USE TO TEST BLOOD GLUCOSE 1-2 TIMES DAILY 200 each 3  . albuterol (VENTOLIN HFA) 108 (90 Base) MCG/ACT inhaler INHALE 2 PUFFS INTO THE LUNGS EVERY 4 (FOUR) HOURS AS NEEDED FOR WHEEZING OR SHORTNESS OF BREATH. 1 Inhaler 5  . AMBULATORY NON FORMULARY MEDICATION Accu-Chek FastClix Lancets Drum. Use to check blood sugar twice a day. Type 2 diabetes. 5 Units 11  . AMBULATORY NON FORMULARY MEDICATION Accu-Chek SmartView glucose testing strips. Use to check blood sugar twice a day.  Type 2 diabetes. 100 Units 11  . benzonatate (TESSALON) 200 MG capsule Take 1 capsule (200 mg total) by mouth 3 (three) times daily as needed for cough. 20 capsule 1  . budesonide-formoterol (SYMBICORT) 160-4.5 MCG/ACT inhaler Inhale 2 puffs, then rinse mouth, twice daily 3 Inhaler 4  . cholecalciferol (VITAMIN D) 1000 units tablet Take 1,000 Units by mouth at bedtime.     Marland Kitchen diltiazem (CARDIZEM CD) 240 MG 24 hr capsule Take 1 capsule (240 mg total) by mouth daily. (Patient taking differently: Take 240 mg by mouth at bedtime. ) 90 capsule 3  . furosemide (LASIX) 20 MG tablet Take 1 tablet (20 mg total) by mouth 2 (two) times daily. (Patient taking differently: Take 20 mg by mouth daily. ) 180 tablet 1  . Glucosamine-Chondroit-Vit C-Mn (GLUCOSAMINE CHONDR 500 COMPLEX PO) Take 1  tablet by mouth daily.     . hydrochlorothiazide (MICROZIDE) 12.5 MG capsule TAKE 1 CAPSULE EVERY DAY (Patient taking differently: TAKE 1 CAPSULE EVERY DAY at noon) 90 capsule 1  . ipratropium-albuterol (DUONEB) 0.5-2.5 (3) MG/3ML SOLN Take 3 mLs by nebulization every 4 (four) hours as needed. 360 mL 3  . latanoprost (XALATAN) 0.005 % ophthalmic solution Place 1 drop into both eyes  at bedtime.    . Lutein ([facility] LUTEIN) 40 MG CAPS Take 1 capsule by mouth daily.    . metFORMIN (GLUCOPHAGE) 500 MG tablet Take 500 mg by mouth 2 (two) times daily with a meal.    . metoprolol succinate (TOPROL-XL) 25 MG 24 hr tablet Take 1 tablet (25 mg total) by mouth daily. 14 tablet 0  . Multiple Vitamins-Minerals (PRESERVISION AREDS PO) Take 1 capsule by mouth 2 (two) times daily. [place] Preservision    . omeprazole (PRILOSEC) 20 MG capsule Take 1 capsule (20 mg total) by mouth 2 (two) times daily before a meal. 180 capsule 3  . ondansetron (ZOFRAN) 4 MG tablet Take 1 tablet (4 mg total) by mouth every 6 (six) hours as needed for nausea or vomiting. 20 tablet 0  . OXYGEN Inhale 3 L into the lungs.     . potassium chloride (KLOR-CON 10) 10 MEQ tablet Take 1 tablet (10 mEq total) by mouth daily. 90 tablet 1  . Respiratory Therapy Supplies (FLUTTER) DEVI Blow through 4 times per set, 3 sets per day 1 each 0  . rivaroxaban (XARELTO) 20 MG TABS tablet Take 1 tablet (20 mg total) by mouth daily with supper. 90 tablet 3   No current facility-administered medications on file prior to visit.     BP 120/64 (BP Location: Left Wrist)   Pulse 83   Temp 97.9 F (36.6 C) (Oral)   SpO2 91% Comment: 3 ltr      Objective:   Physical Exam  Constitutional: She is oriented to person, place, and time. She appears well-developed and well-nourished. No distress.  Eyes: Pupils are equal, round, and reactive to light. EOM are normal.  Cardiovascular: Normal rate and regular rhythm.  Pulmonary/Chest: Effort normal. She has decreased  breath sounds in the left middle field and the left lower field. She has wheezes in the right upper field, the right middle field, the left upper field and the left middle field. She has no rhonchi. She has no rales.  Dry cough present  Musculoskeletal: She exhibits edema (bilateral lower extremity swelling ).  In motorized wheelchair   Neurological: She is alert and oriented to person, place, and time.  Skin: Skin is warm and dry. Capillary refill takes less than 2 seconds. She is not diaphoretic.  Psychiatric: She has a normal mood and affect. Her behavior is normal. Judgment and thought content normal.  Nursing note and vitals reviewed.     Assessment & Plan:  She does not appear to be septic, does have a dry cough present throughout exam.  Advised from my point of view there is nothing to do at this point until least the CAT scan has resulted.  She ss okay with this plan.  [name], NP

## 2017-08-09 ENCOUNTER — Encounter: Payer: Self-pay | Admitting: Internal Medicine

## 2017-08-09 NOTE — Telephone Encounter (Signed)
The CT does not "show nothing". It shows areas where the lung tissue can't open up fully, apparently trapped by old scarring. Areas like this never clear mucus well and often have lingering irflammation which would cause cough. Unfortunately the cough can't clear the problem. There may not be a good solution, but we will keep trying.

## 2017-08-09 NOTE — Telephone Encounter (Signed)
Dr. Annamaria Boots please advise on patients e-mail below. Thank you.

## 2017-08-12 ENCOUNTER — Encounter: Payer: Self-pay | Admitting: Adult Health

## 2017-08-12 NOTE — Telephone Encounter (Signed)
The patient's husband came in the office today.  The patient has 4 pills left, as of today.  He needs this Rx sent to:  [id number] [place], [place] - [address] [phone](Phone) [phone](Fax)

## 2017-08-13 ENCOUNTER — Other Ambulatory Visit: Payer: Self-pay | Admitting: Adult Health

## 2017-08-13 DIAGNOSIS — R0602 Shortness of breath: Secondary | ICD-10-CM

## 2017-08-13 DIAGNOSIS — R059 Cough, unspecified: Secondary | ICD-10-CM

## 2017-08-13 DIAGNOSIS — R05 Cough: Secondary | ICD-10-CM

## 2017-08-13 DIAGNOSIS — R062 Wheezing: Secondary | ICD-10-CM

## 2017-08-13 DIAGNOSIS — J441 Chronic obstructive pulmonary disease with (acute) exacerbation: Secondary | ICD-10-CM

## 2017-08-13 DIAGNOSIS — J9611 Chronic respiratory failure with hypoxia: Secondary | ICD-10-CM

## 2017-08-13 MED ORDER — BENZONATATE 200 MG PO CAPS: 200.0000 mg | ORAL_CAPSULE | Freq: Three times a day (TID) | ORAL | 1 refills | Status: DC | PRN

## 2017-08-14 MED ORDER — APIXABAN 5 MG PO TABS: 5.0000 mg | ORAL_TABLET | Freq: Two times a day (BID) | ORAL | 3 refills | Status: DC

## 2017-08-20 NOTE — Telephone Encounter (Signed)
Paperwork faxed to [employer]

## 2017-08-23 ENCOUNTER — Ambulatory Visit: Payer: Medicare HMO | Admitting: Internal Medicine

## 2017-08-23 NOTE — Telephone Encounter (Signed)
[employer] assistance has been approved

## 2017-08-27 ENCOUNTER — Encounter: Payer: Self-pay | Admitting: Cardiology

## 2017-08-27 ENCOUNTER — Encounter: Payer: Self-pay | Admitting: Adult Health

## 2017-08-31 ENCOUNTER — Encounter: Payer: Self-pay | Admitting: Adult Health

## 2017-08-31 ENCOUNTER — Other Ambulatory Visit: Payer: Self-pay | Admitting: Adult Health

## 2017-08-31 DIAGNOSIS — E119 Type 2 diabetes mellitus without complications: Secondary | ICD-10-CM

## 2017-09-03 ENCOUNTER — Encounter: Payer: Self-pay | Admitting: Adult Health

## 2017-09-03 DIAGNOSIS — J449 Chronic obstructive pulmonary disease, unspecified: Secondary | ICD-10-CM | POA: Diagnosis not present

## 2017-09-03 DIAGNOSIS — R600 Localized edema: Secondary | ICD-10-CM

## 2017-09-03 MED ORDER — HYDROCHLOROTHIAZIDE 12.5 MG PO CAPS: 12.5000 mg | ORAL_CAPSULE | Freq: Every day | ORAL | 0 refills | Status: DC

## 2017-09-03 MED ORDER — ACCU-CHEK NANO SMARTVIEW W/DEVICE KIT: PACK | 0 refills | Status: DC

## 2017-09-03 MED ORDER — ACCU-CHEK FASTCLIX LANCETS MISC: 3 refills | Status: DC

## 2017-09-03 MED ORDER — ACCU-CHEK FASTCLIX LANCET KIT: PACK | 0 refills | Status: DC

## 2017-09-03 NOTE — Telephone Encounter (Signed)
Sent to the pharmacy by e-scribe. 

## 2017-09-11 ENCOUNTER — Ambulatory Visit (INDEPENDENT_AMBULATORY_CARE_PROVIDER_SITE_OTHER): Payer: Medicare HMO | Admitting: Adult Health

## 2017-09-11 ENCOUNTER — Encounter: Payer: Self-pay | Admitting: Adult Health

## 2017-09-11 VITALS — BP 132/78 | HR 67 | Temp 97.9°F | Wt 223.4 lb

## 2017-09-11 DIAGNOSIS — J441 Chronic obstructive pulmonary disease with (acute) exacerbation: Secondary | ICD-10-CM | POA: Diagnosis not present

## 2017-09-11 DIAGNOSIS — Z0000 Encounter for general adult medical examination without abnormal findings: Secondary | ICD-10-CM | POA: Diagnosis not present

## 2017-09-11 DIAGNOSIS — I4891 Unspecified atrial fibrillation: Secondary | ICD-10-CM

## 2017-09-11 DIAGNOSIS — E119 Type 2 diabetes mellitus without complications: Secondary | ICD-10-CM

## 2017-09-11 DIAGNOSIS — R600 Localized edema: Secondary | ICD-10-CM

## 2017-09-11 DIAGNOSIS — E559 Vitamin D deficiency, unspecified: Secondary | ICD-10-CM

## 2017-09-11 LAB — CBC WITH DIFFERENTIAL/PLATELET
BASOS PCT: 1 % (ref 0.0–3.0)
Basophils Absolute: 0.1 10*3/uL (ref 0.0–0.1)
EOS PCT: 3.5 % (ref 0.0–5.0)
Eosinophils Absolute: 0.3 10*3/uL (ref 0.0–0.7)
HCT: 35.6 % — ABNORMAL LOW (ref 36.0–46.0)
Hemoglobin: 11.3 g/dL — ABNORMAL LOW (ref 12.0–15.0)
Lymphocytes Relative: 28.4 % (ref 12.0–46.0)
Lymphs Abs: 2.3 10*3/uL (ref 0.7–4.0)
MCHC: 31.8 g/dL (ref 30.0–36.0)
MCV: 93.3 fl (ref 78.0–100.0)
MONO ABS: 0.7 10*3/uL (ref 0.1–1.0)
Monocytes Relative: 8.5 % (ref 3.0–12.0)
NEUTROS PCT: 58.6 % (ref 43.0–77.0)
Neutro Abs: 4.8 10*3/uL (ref 1.4–7.7)
Platelets: 208 10*3/uL (ref 150.0–400.0)
RBC: 3.81 Mil/uL — ABNORMAL LOW (ref 3.87–5.11)
RDW: 16.7 % — AB (ref 11.5–15.5)
WBC: 8.2 10*3/uL (ref 4.0–10.5)

## 2017-09-11 LAB — LIPID PANEL
CHOLESTEROL: 173 mg/dL (ref 0–200)
HDL: 43.3 mg/dL (ref >39.00)
LDL Cholesterol: 97 mg/dL (ref 0–99)
NonHDL: 130.15
Total CHOL/HDL Ratio: 4
Triglycerides: 165 mg/dL — ABNORMAL HIGH (ref 0.0–149.0)
VLDL: 33 mg/dL (ref 0.0–40.0)

## 2017-09-11 LAB — HEMOGLOBIN A1C: HEMOGLOBIN A1C: 6.3 % (ref 4.6–6.5)

## 2017-09-11 LAB — HEPATIC FUNCTION PANEL
ALBUMIN: 3.6 g/dL (ref 3.5–5.2)
ALT: 10 U/L (ref 0–35)
AST: 10 U/L (ref 0–37)
Alkaline Phosphatase: 210 U/L — ABNORMAL HIGH (ref 39–117)
Bilirubin, Direct: 0.1 mg/dL (ref 0.0–0.3)
Total Bilirubin: 0.5 mg/dL (ref 0.2–1.2)
Total Protein: 6.5 g/dL (ref 6.0–8.3)

## 2017-09-11 LAB — BASIC METABOLIC PANEL
BUN: 19 mg/dL (ref 6–23)
CHLORIDE: 98 meq/L (ref 96–112)
CO2: 35 meq/L — AB (ref 19–32)
Calcium: 9.7 mg/dL (ref 8.4–10.5)
Creatinine, Ser: 0.95 mg/dL (ref 0.40–1.20)
GFR: 59.89 mL/min — ABNORMAL LOW (ref >60.00)
Glucose, Bld: 119 mg/dL — ABNORMAL HIGH (ref 70–99)
POTASSIUM: 4 meq/L (ref 3.5–5.1)
Sodium: 141 mEq/L (ref 135–145)

## 2017-09-11 LAB — VITAMIN D 25 HYDROXY (VIT D DEFICIENCY, FRACTURES): VITD: 64.91 ng/mL (ref 30.00–100.00)

## 2017-09-11 LAB — TSH: TSH: 2.35 u[IU]/mL (ref 0.35–4.50)

## 2017-09-11 NOTE — Progress Notes (Signed)
Subjective:    Patient ID: Becky Newton, female    DOB: Feb 05, 1933, 84 y.o.   MRN: [id number]  HPI  Patient presents for yearly preventative medicine examination. She is a pleasant 84 year old female who  has a past medical history of A-fib ([place]), Atrial fibrillation ([place]), Chicken pox, COPD (chronic obstructive pulmonary disease) ([place]), Diabetes ([place]), Fibrocystic breast determined by biopsy (1977), GERD (gastroesophageal reflux disease), Glaucoma, H/O hernia repair (2006), H/O left breast biopsy (1982), Incisional hernia, Lung disease, S/P scar revision, and Uterine cancer ([place]).   COPD -is followed by pulmonary( Dr. Lake Bells).  She has been on chronic oxygen since 2013.  Currently on 2 L via nasal cannula.  Denies any acute issues  DM -been reasonably controlled in the past with metformin 500 mg twice daily.  She does check her blood sugars at home on a regular basis and reports blood sugar readings below 130 consistently. Lab Results  Component Value Date   HGBA1C 5.9 06/06/2017   Atrial Fibrillation -only controlled with metoprolol and Cardizem.  He was having difficulties affording Xarelto was switched to Eliquis for which she is getting free until the first of the year and then will be reevaluated.  Denies any chest pain or palpitations at this time  Vitamin D Deficiency - takes Vitamin D 100 units daily   Lower extremity edema - well controlled with Lasix 20 mg daily. Swelling worse in late afternoon and early evening   All immunizations and health maintenance protocols were reviewed with the patient and needed orders were placed.  She is up-to-date at this time, will be due for second pneumonia vaccination in the fall.  She would like to hold off on getting this vaccination until she is due  Appropriate screening laboratory values were ordered for the patient including screening of hyperlipidemia, renal function and hepatic function.  Medication reconciliation,  past medical  history, social history, problem list and allergies were reviewed in detail with the patient  Goals were established with regard to weight loss, exercise, and  diet in compliance with medications  End of life planning was discussed.  She has an advanced directive and living will  Review of Systems  Constitutional: Negative.   HENT: Negative.   Eyes: Negative.   Respiratory: Positive for shortness of breath.   Cardiovascular: Positive for leg swelling.  Gastrointestinal: Negative.   Endocrine: Negative.   Musculoskeletal: Negative.   Skin: Negative.   Allergic/Immunologic: Negative.   Neurological: Positive for numbness.  Hematological: Negative.   Psychiatric/Behavioral: Negative.   All other systems reviewed and are negative.  Past Medical History:  Diagnosis Date  . A-fib ([place])   . Atrial fibrillation ([place])   . Chicken pox   . COPD (chronic obstructive pulmonary disease) ([place])   . Diabetes ([place])   . Fibrocystic breast determined by biopsy 1977  . GERD (gastroesophageal reflux disease)   . Glaucoma   . H/O hernia repair 2006  . H/O left breast biopsy 1982  . Incisional hernia   . Lung disease    Paralyzed left hemidiaphragm  . S/P scar revision   . Uterine cancer [facility])     Social History   Socioeconomic History  . Marital status: Married    Spouse name: Not on file  . Number of children: Not on file  . Years of education: Not on file  . Highest education level: Not on file  Occupational History  . Not on file  Social Needs  .  Financial resource strain: Not on file  . Food insecurity:    Worry: Not on file    Inability: Not on file  . Transportation needs:    Medical: Not on file    Non-medical: Not on file  Tobacco Use  . Smoking status: Former Smoker    Packs/day: 0.50    Years: 20.00    Pack years: 10.00    Types: Cigarettes    Last attempt to quit: 08/23/1989    Years since quitting: 28.0  . Smokeless tobacco: Never Used  Substance and Sexual  Activity  . Alcohol use: No    Alcohol/week: 0.0 standard drinks  . Drug use: No  . Sexual activity: Never    Partners: Male  Lifestyle  . Physical activity:    Days per week: Not on file    Minutes per session: Not on file  . Stress: Not on file  Relationships  . Social connections:    Talks on phone: Not on file    Gets together: Not on file    Attends religious service: Not on file    Active member of club or organization: Not on file    Attends meetings of clubs or organizations: Not on file    Relationship status: Not on file  . Intimate partner violence:    Fear of current or ex partner: Not on file    Emotionally abused: Not on file    Physically abused: Not on file    Forced sexual activity: Not on file  Other Topics Concern  . Not on file  Social History Narrative  . Not on file    Past Surgical History:  Procedure Laterality Date  . APPENDECTOMY  1966  . BREAST BIOPSY    . CATARACT EXTRACTION  2004,2005  . CHOLECYSTECTOMY  1975  . HERNIA REPAIR  2006  . HIATAL HERNIA REPAIR  1966  . NISSEN FUNDOPLICATION    . TOTAL ABDOMINAL HYSTERECTOMY W/ BILATERAL SALPINGOOPHORECTOMY  1974   hx of cancer     Family History  Problem Relation Age of Onset  . Breast cancer Mother   . Arthritis Mother   . Stroke Mother   . Heart attack Mother   . Heart disease Mother   . Alcohol abuse Brother     Allergies  Allergen Reactions  . Ivp Dye [Iodinated Diagnostic Agents] Nausea And Vomiting    Current Outpatient Medications on File Prior to Visit  Medication Sig Dispense Refill  . ACCU-CHEK FASTCLIX LANCETS MISC USE TO TEST BLOOD GLUCOSE TWICE DAILY 204 each 3  . ACCU-CHEK SMARTVIEW test strip USE TO TEST BLOOD GLUCOSE 1-2 TIMES DAILY 200 each 3  . albuterol (VENTOLIN HFA) 108 (90 Base) MCG/ACT inhaler INHALE 2 PUFFS INTO THE LUNGS EVERY 4 (FOUR) HOURS AS NEEDED FOR WHEEZING OR SHORTNESS OF BREATH. 1 Inhaler 5  . AMBULATORY NON FORMULARY MEDICATION Accu-Chek FastClix  Lancets Drum. Use to check blood sugar twice a day. Type 2 diabetes. 5 Units 11  . AMBULATORY NON FORMULARY MEDICATION Accu-Chek SmartView glucose testing strips. Use to check blood sugar twice a day.  Type 2 diabetes. 100 Units 11  . apixaban (ELIQUIS) 5 MG TABS tablet Take 1 tablet (5 mg total) by mouth 2 (two) times daily. 180 tablet 3  . benzonatate (TESSALON) 200 MG capsule Take 1 capsule (200 mg total) by mouth 3 (three) times daily as needed for cough. 20 capsule 1  . Blood Glucose Monitoring Suppl (ACCU-CHEK NANO SMARTVIEW) w/Device KIT  USE TO TEST BLOOD GLUCOSE TWICE DAILY 1 kit 0  . budesonide-formoterol (SYMBICORT) 160-4.5 MCG/ACT inhaler Inhale 2 puffs, then rinse mouth, twice daily 3 Inhaler 4  . cholecalciferol (VITAMIN D) 1000 units tablet Take 1,000 Units by mouth at bedtime.     Mar[place] Kitchen diltiazem (CARDIZEM CD) 240 MG 24 hr capsule Take 1 capsule (240 mg total) by mouth daily. (Patient taking differently: Take 240 mg by mouth at bedtime. ) 90 capsule 3  . furosemide (LASIX) 20 MG tablet Take 1 tablet (20 mg total) by mouth 2 (two) times daily. (Patient taking differently: Take 20 mg by mouth daily. ) 180 tablet 1  . Glucosamine-Chondroit-Vit C-Mn (GLUCOSAMINE CHONDR 500 COMPLEX PO) Take 1 tablet by mouth daily.     . hydrochlorothiazide (MICROZIDE) 12.5 MG capsule Take 1 capsule (12.5 mg total) by mouth daily. 90 capsule 0  . ipratropium-albuterol (DUONEB) 0.5-2.5 (3) MG/3ML SOLN Take 3 mLs by nebulization every 4 (four) hours as needed. 360 mL 3  . Lancets Misc. (ACCU-CHEK FASTCLIX LANCET) KIT USE TO TEST BLOOD GLUCOSE TWICE DAILY 1 kit 0  . latanoprost (XALATAN) 0.005 % ophthalmic solution Place 1 drop into both eyes at bedtime.    . Lutein ([employer] LUTEIN) 40 MG CAPS Take 1 capsule by mouth daily.    . metFORMIN (GLUCOPHAGE) 500 MG tablet TAKE 1 TABLET TWICE DAILY WITH MEALS 180 tablet 0  . metoprolol succinate (TOPROL-XL) 25 MG 24 hr tablet Take 1 tablet (25 mg total) by mouth daily. 14  tablet 0  . Multiple Vitamins-Minerals (PRESERVISION AREDS PO) Take 1 capsule by mouth 2 (two) times daily. Areds Preservision    . omeprazole (PRILOSEC) 20 MG capsule Take 1 capsule (20 mg total) by mouth 2 (two) times daily before a meal. 180 capsule 3  . ondansetron (ZOFRAN) 4 MG tablet Take 1 tablet (4 mg total) by mouth every 6 (six) hours as needed for nausea or vomiting. 20 tablet 0  . OXYGEN Inhale 3 L into the lungs.     . potassium chloride (K-DUR) 10 MEQ tablet TAKE 1 TABLET EVERY DAY 90 tablet 0  . Respiratory Therapy Supplies (FLUTTER) DEVI Blow through 4 times per set, 3 sets per day 1 each 0   No current facility-administered medications on file prior to visit.     BP 132/78 (BP Location: Right Arm, Patient Position: Sitting, Cuff Size: Normal)   Pulse 67   Temp 97.9 F (36.6 C) (Oral)   Wt 223 lb 6 oz (101.3 kg)   SpO2 95%   BMI 41.52 kg/m       Objective:   Physical Exam  Constitutional: She is oriented to person, place, and time. She appears well-developed and well-nourished. No distress.  HENT:  Head: Normocephalic and atraumatic.  Right Ear: External ear normal.  Left Ear: External ear normal.  Nose: Nose normal.  Mouth/Throat: Oropharynx is clear and moist. No oropharyngeal exudate.  Eyes: Pupils are equal, round, and reactive to light. Conjunctivae and EOM are normal. Right eye exhibits no discharge. Left eye exhibits no discharge. No scleral icterus.  Neck: Normal range of motion.  Cardiovascular: Normal rate, regular rhythm, normal heart sounds and intact distal pulses. Exam reveals no gallop and no friction rub.  No murmur heard. Pulmonary/Chest: Effort normal. No stridor. No respiratory distress. She has decreased breath sounds (throughout ). She has no wheezes. She has no rales. She exhibits no tenderness.  2Liters via nasal cannula  Abdominal: Soft. Bowel sounds are normal. She  exhibits no distension and no mass. There is no tenderness. There is no  rebound and no guarding. No hernia.  Musculoskeletal: She exhibits edema (Trace bilateral lower extremity pitting edema). She exhibits no tenderness or deformity.  Sitting in motorized wheelchair.  Unable to climb onto exam table  Neurological: She is alert and oriented to person, place, and time. She displays normal reflexes. No cranial nerve deficit or sensory deficit. She exhibits normal muscle tone. Coordination normal.  Skin: Skin is warm and dry. Capillary refill takes less than 2 seconds. No rash noted. She is not diaphoretic. No erythema. No pallor.  Psychiatric: She has a normal mood and affect. Her behavior is normal. Judgment and thought content normal.  Nursing note and vitals reviewed.     Assessment & Plan:  1. Routine general medical examination at a health care facility -Urged diabetic diet.  She is unable to exercise due to being in motorized wheelchair. -Low up in 1 year for next physical exam or sooner if needed - Basic metabolic panel - CBC with Differential/Platelet - Hemoglobin A1c - Hepatic function panel - Lipid panel - TSH  2. Atrial fibrillation with RVR (HCC) -Controlled.  Continue with plan of care by cardiology - Basic metabolic panel - CBC with Differential/Platelet  3. COPD with acute exacerbation ([place]) -Continue with plan of care by pulmonary  4. Lower extremity edema - Continue with Lasix and Kdur  - Basic metabolic panel - CBC with Differential/Platelet  5. Type 2 diabetes mellitus without complication, without long-term current use of insulin (HCC) -Consider increase in metformin -Likely follow-up in 6 months for A1c check - Basic metabolic panel - CBC with Differential/Platelet - Hemoglobin A1c - Hepatic function panel - Lipid panel - TSH  6. Vitamin D deficiency  - Vitamin D, 25-hydroxy   [name], NP

## 2017-09-12 ENCOUNTER — Other Ambulatory Visit: Payer: Self-pay | Admitting: Adult Health

## 2017-09-12 ENCOUNTER — Encounter: Payer: Self-pay | Admitting: *Deleted

## 2017-09-12 ENCOUNTER — Encounter: Payer: Self-pay | Admitting: Adult Health

## 2017-09-12 DIAGNOSIS — R748 Abnormal levels of other serum enzymes: Secondary | ICD-10-CM

## 2017-09-13 NOTE — Telephone Encounter (Signed)
Dr Tommi Rumps, what we talked about this mrning regarding [employer] NEEDING AND ANSWER on an ACCU CHECK KIT. i just got another msg that it is on hold cause they can not reach you. thanks DR. CORY.  Cory please advise if you have seen any form come through for the pts accucheck kit.  Thanks

## 2017-09-18 ENCOUNTER — Encounter: Payer: Self-pay | Admitting: Adult Health

## 2017-09-25 ENCOUNTER — Encounter: Payer: Self-pay | Admitting: Adult Health

## 2017-09-25 ENCOUNTER — Other Ambulatory Visit (INDEPENDENT_AMBULATORY_CARE_PROVIDER_SITE_OTHER): Payer: Medicare HMO

## 2017-09-25 DIAGNOSIS — R748 Abnormal levels of other serum enzymes: Secondary | ICD-10-CM | POA: Diagnosis not present

## 2017-09-25 LAB — HEPATIC FUNCTION PANEL
ALBUMIN: 3.4 g/dL — AB (ref 3.5–5.2)
ALK PHOS: 103 U/L (ref 39–117)
ALT: 7 U/L (ref 0–35)
AST: 9 U/L (ref 0–37)
Bilirubin, Direct: 0.1 mg/dL (ref 0.0–0.3)
Total Bilirubin: 0.4 mg/dL (ref 0.2–1.2)
Total Protein: 6.1 g/dL (ref 6.0–8.3)

## 2017-09-30 ENCOUNTER — Encounter: Payer: Self-pay | Admitting: Pulmonary Disease

## 2017-09-30 ENCOUNTER — Ambulatory Visit: Payer: Medicare HMO | Admitting: Pulmonary Disease

## 2017-09-30 ENCOUNTER — Encounter: Payer: Self-pay | Admitting: Adult Health

## 2017-09-30 VITALS — BP 128/64 | HR 71

## 2017-09-30 DIAGNOSIS — J986 Disorders of diaphragm: Secondary | ICD-10-CM

## 2017-09-30 DIAGNOSIS — J9611 Chronic respiratory failure with hypoxia: Secondary | ICD-10-CM

## 2017-09-30 DIAGNOSIS — Z23 Encounter for immunization: Secondary | ICD-10-CM

## 2017-09-30 DIAGNOSIS — J9811 Atelectasis: Secondary | ICD-10-CM | POA: Diagnosis not present

## 2017-09-30 MED ORDER — SPACER/AERO-HOLDING CHAMBERS DEVI: 1.0000 | Freq: Once | 0 refills | Status: AC

## 2017-09-30 NOTE — Patient Instructions (Signed)
Asthma-possible COPD: Take Symbicort 2 puffs twice a day no matter how you feel through a spacer Use albuterol through a spacer If you find that your breathing has improved after 4 weeks of using Symbicort routinely call me and we can work with your insurance company to prescribe their preferred alternative agent for a controller medicine for your asthma and COPD. High-dose flu shot today Practice good hand hygiene Stay active  Chronic respiratory failure with hypoxemia due to left hemidiaphragm paralysis: Continue to exercise as much as possible Try your best to keep your weight down Use 2 L continuously  We will see you back in 4 months or sooner if needed

## 2017-09-30 NOTE — Progress Notes (Signed)
Subjective:   PATIENT ID: Becky Newton GENDER: female DOB: Sep 24, 1933, MRN: [id number]  Synopsis: Referred in Dec 2018 for possible sleep apnea; she also has chronic respiratory failure with hypoxemia Smoked 1/2 PPD for 27 years She has a history of left hemidiaphragm paralysis after a Nissen fundoplication performed in the 1980's. Dr. Arlyce Dice performed what sounds like a plication surgery of the left hemidiaphragm (around 1990's).   HPI  Chief Complaint  Patient presents with  . Follow-up    Dr. Annamaria Boots referred, having trouble breathing, using 2 L, has had 2 nosebleeds, gushing blood    Becky Newton says that her breathing is about the same.  She had an episode of A. fib and went to the emergency department earlier this year which then prompted several visits to our clinic.  She had a CT scan of her chest which showed chronic left-sided hemidiaphragm paralysis which has been known in the past.  In general she says that her breathing is bad gets better with albuterol, but has not really worsened over the last year.  She has been on oxygen for the last 6 years which has been attributed to her left hemidiaphragm paralysis.  She has not had problems with chest congestion or mucus production.  She does not take the Symbicort routinely.  She only uses it when she feels that she has more chest congestion.  She does use albuterol several times a day and she says that it helps quite a bit.  She continues to use and benefit from her oxygen including her portable oxygen concentrator.  She has a little bit of confusion about how much oxygen she should be using when she is exerting herself.  Currently she uses 2 to 3 L.   Past Medical History:  Diagnosis Date  . A-fib ([place])   . Atrial fibrillation ([place])   . Chicken pox   . COPD (chronic obstructive pulmonary disease) ([place])   . Diabetes ([place])   . Fibrocystic breast determined by biopsy 1977  . GERD (gastroesophageal reflux disease)   . Glaucoma   .  H/O hernia repair 2006  . H/O left breast biopsy 1982  . Incisional hernia   . Lung disease    Paralyzed left hemidiaphragm  . S/P scar revision   . Uterine cancer ([place])       Review of Systems  Constitutional: Positive for malaise/fatigue. Negative for chills, fever and weight loss.  HENT: Negative for congestion, nosebleeds, sinus pain and sore throat.   Eyes: Negative for photophobia, pain and discharge.  Respiratory: Positive for cough and shortness of breath. Negative for hemoptysis, sputum production and wheezing.   Cardiovascular: Positive for orthopnea and leg swelling. Negative for chest pain and palpitations.  Gastrointestinal: Negative for abdominal pain, constipation, diarrhea, nausea and vomiting.  Genitourinary: Negative for dysuria, frequency, hematuria and urgency.  Musculoskeletal: Negative for back pain, joint pain, myalgias and neck pain.  Skin: Negative for itching and rash.  Neurological: Negative for tingling, tremors, sensory change, speech change, focal weakness, seizures, weakness and headaches.  Psychiatric/Behavioral: Negative for memory loss, substance abuse and suicidal ideas. The patient is not nervous/anxious.       Objective:  Physical Exam   Vitals:   09/30/17 1136 09/30/17 1143  BP:  128/64  Pulse: 71   SpO2: 94%     2L [place]  Walked about 175 feet on 2 L nasal cannula and her O2 saturation remained at 92%  Gen: obese, chronically ill appearing  HENT: OP clear, TM's clear, neck supple PULM: Poor air movement, crackles bases, normal percussion CV: RRR, no mgr, trace edema GI: BS+, soft, nontender Derm: no cyanosis or rash Psyche: normal mood and affect    CBC    Component Value Date/Time   WBC 8.2 09/11/2017 1108   RBC 3.81 (L) 09/11/2017 1108   HGB 11.3 (L) 09/11/2017 1108   HCT 35.6 (L) 09/11/2017 1108   PLT 208.0 09/11/2017 1108   MCV 93.3 09/11/2017 1108   MCH 29.1 07/25/2017 1403   MCHC 31.8 09/11/2017 1108   RDW 16.7 (H)  09/11/2017 1108   LYMPHSABS 2.3 09/11/2017 1108   MONOABS 0.7 09/11/2017 1108   EOSABS 0.3 09/11/2017 1108   BASOSABS 0.1 09/11/2017 1108     Chest imaging: 07/2017 CT chest imnages reviewed; several areas of subsegmental atelectasis, most prominent in RML and left lower lobe, airway calcification noted, some mosaicism and emphysema  PFT:  Labs:  Path:  Echo:  Heart Catheterization:  Records from her visits with Dr. Annamaria Boots reviewed where she was noted to have atelectasis and efforts were made at mucociliary clearance.     Assessment & Plan:   Atelectasis of left lung  Paralysis, diaphragm  Chronic respiratory failure with hypoxia (HCC)  Discussion: Becky Newton has chronic respiratory failure with hypoxemia due to obesity, left-sided hemidiaphragm paralysis, and significant atelectasis in both the right and left lungs.  She may also have asthma and a COPD overlap syndrome though she is not interested in having a lung function test.  She is really most interested in anything that will help her breathe better, but she says she understands that she does not think she is going to get much better.  I explained to her today that I think the most likely explanation for her shortness of breath and chronic respiratory failure with hypoxemia is her morbid obesity, physical deconditioning, and left hemidiaphragm paralysis.  That being said, considering her having smoking history she is at high risk for COPD so I think it worthwhile to at least try taking Symbicort as prescribed for a month to see if it helps with her breathing.  Plan: Asthma-possible COPD: Take Symbicort 2 puffs twice a day no matter how you feel through a spacer Use albuterol through a spacer If you find that your breathing has improved after 4 weeks of using Symbicort routinely call me and we can work with your insurance company to prescribe their preferred alternative agent for a controller medicine for your asthma and  COPD. High-dose flu shot today Practice good hand hygiene Stay active  Chronic respiratory failure with hypoxemia due to left hemidiaphragm paralysis: Continue to exercise as much as possible Try your best to keep your weight down Use 2 L continuously  We will see you back in 4 months or sooner if needed  > 50% of this 25 min visit spent face to face    Current Outpatient Medications:  .  ACCU-CHEK FASTCLIX LANCETS MISC, USE TO TEST BLOOD GLUCOSE TWICE DAILY, Disp: 204 each, Rfl: 3 .  ACCU-CHEK SMARTVIEW test strip, USE TO TEST BLOOD GLUCOSE 1-2 TIMES DAILY, Disp: 200 each, Rfl: 3 .  albuterol (VENTOLIN HFA) 108 (90 Base) MCG/ACT inhaler, INHALE 2 PUFFS INTO THE LUNGS EVERY 4 (FOUR) HOURS AS NEEDED FOR WHEEZING OR SHORTNESS OF BREATH., Disp: 1 Inhaler, Rfl: 5 .  AMBULATORY NON FORMULARY MEDICATION, Accu-Chek FastClix Lancets Drum. Use to check blood sugar twice a day. Type 2 diabetes., Disp: 5 Units, Rfl:  11 .  AMBULATORY NON FORMULARY MEDICATION, Accu-Chek SmartView glucose testing strips. Use to check blood sugar twice a day.  Type 2 diabetes., Disp: 100 Units, Rfl: 11 .  apixaban (ELIQUIS) 5 MG TABS tablet, Take 1 tablet (5 mg total) by mouth 2 (two) times daily., Disp: 180 tablet, Rfl: 3 .  benzonatate (TESSALON) 200 MG capsule, Take 1 capsule (200 mg total) by mouth 3 (three) times daily as needed for cough., Disp: 20 capsule, Rfl: 1 .  Blood Glucose Monitoring Suppl (ACCU-CHEK NANO SMARTVIEW) w/Device KIT, USE TO TEST BLOOD GLUCOSE TWICE DAILY, Disp: 1 kit, Rfl: 0 .  budesonide-formoterol (SYMBICORT) 160-4.5 MCG/ACT inhaler, Inhale 2 puffs, then rinse mouth, twice daily, Disp: 3 Inhaler, Rfl: 4 .  cholecalciferol (VITAMIN D) 1000 units tablet, Take 1,000 Units by mouth at bedtime. , Disp: , Rfl:  .  diltiazem (CARDIZEM CD) 240 MG 24 hr capsule, Take 1 capsule (240 mg total) by mouth daily. (Patient taking differently: Take 240 mg by mouth at bedtime. ), Disp: 90 capsule, Rfl: 3 .   furosemide (LASIX) 20 MG tablet, Take 1 tablet (20 mg total) by mouth 2 (two) times daily. (Patient taking differently: Take 20 mg by mouth daily. ), Disp: 180 tablet, Rfl: 1 .  Glucosamine-Chondroit-Vit C-Mn (GLUCOSAMINE CHONDR 500 COMPLEX PO), Take 1 tablet by mouth daily. , Disp: , Rfl:  .  hydrochlorothiazide (MICROZIDE) 12.5 MG capsule, Take 1 capsule (12.5 mg total) by mouth daily., Disp: 90 capsule, Rfl: 0 .  ipratropium-albuterol (DUONEB) 0.5-2.5 (3) MG/3ML SOLN, Take 3 mLs by nebulization every 4 (four) hours as needed., Disp: 360 mL, Rfl: 3 .  Lancets Misc. (ACCU-CHEK FASTCLIX LANCET) KIT, USE TO TEST BLOOD GLUCOSE TWICE DAILY, Disp: 1 kit, Rfl: 0 .  latanoprost (XALATAN) 0.005 % ophthalmic solution, Place 1 drop into both eyes at bedtime., Disp: , Rfl:  .  Lutein ([employer] LUTEIN) 40 MG CAPS, Take 1 capsule by mouth daily., Disp: , Rfl:  .  metFORMIN (GLUCOPHAGE) 500 MG tablet, TAKE 1 TABLET TWICE DAILY WITH MEALS, Disp: 180 tablet, Rfl: 0 .  metoprolol succinate (TOPROL-XL) 25 MG 24 hr tablet, Take 1 tablet (25 mg total) by mouth daily., Disp: 14 tablet, Rfl: 0 .  Multiple Vitamins-Minerals (PRESERVISION AREDS PO), Take 1 capsule by mouth 2 (two) times daily. Areds Preservision, Disp: , Rfl:  .  omeprazole (PRILOSEC) 20 MG capsule, Take 1 capsule (20 mg total) by mouth 2 (two) times daily before a meal., Disp: 180 capsule, Rfl: 3 .  ondansetron (ZOFRAN) 4 MG tablet, Take 1 tablet (4 mg total) by mouth every 6 (six) hours as needed for nausea or vomiting., Disp: 20 tablet, Rfl: 0 .  OXYGEN, Inhale 3 L into the lungs. , Disp: , Rfl:  .  potassium chloride (K-DUR) 10 MEQ tablet, TAKE 1 TABLET EVERY DAY, Disp: 90 tablet, Rfl: 0 .  Respiratory Therapy Supplies (FLUTTER) DEVI, Blow through 4 times per set, 3 sets per day, Disp: 1 each, Rfl: 0

## 2017-10-04 DIAGNOSIS — J449 Chronic obstructive pulmonary disease, unspecified: Secondary | ICD-10-CM | POA: Diagnosis not present

## 2017-10-18 ENCOUNTER — Ambulatory Visit: Payer: Medicare HMO | Admitting: Cardiology

## 2017-10-28 DIAGNOSIS — Z0100 Encounter for examination of eyes and vision without abnormal findings: Secondary | ICD-10-CM | POA: Diagnosis not present

## 2017-10-28 DIAGNOSIS — E119 Type 2 diabetes mellitus without complications: Secondary | ICD-10-CM | POA: Diagnosis not present

## 2017-10-28 DIAGNOSIS — H52223 Regular astigmatism, bilateral: Secondary | ICD-10-CM | POA: Diagnosis not present

## 2017-10-28 LAB — HM DIABETES EYE EXAM

## 2017-11-01 ENCOUNTER — Other Ambulatory Visit: Payer: Self-pay | Admitting: Adult Health

## 2017-11-01 NOTE — Telephone Encounter (Signed)
Sent to the pharmacy by e-scribe.

## 2017-11-03 DIAGNOSIS — J449 Chronic obstructive pulmonary disease, unspecified: Secondary | ICD-10-CM | POA: Diagnosis not present

## 2017-11-06 ENCOUNTER — Encounter: Payer: Self-pay | Admitting: Family Medicine

## 2017-11-11 ENCOUNTER — Encounter: Payer: Self-pay | Admitting: Cardiology

## 2017-11-11 ENCOUNTER — Ambulatory Visit: Payer: Medicare HMO | Admitting: Cardiology

## 2017-11-11 VITALS — BP 118/56 | HR 73 | Ht 61.5 in | Wt 224.0 lb

## 2017-11-11 DIAGNOSIS — I480 Paroxysmal atrial fibrillation: Secondary | ICD-10-CM | POA: Diagnosis not present

## 2017-11-11 NOTE — Patient Instructions (Signed)

## 2017-11-11 NOTE — Progress Notes (Signed)
    HPI: Follow-up atrial fibrillation.  Last echocardiogram February 2019 showed ejection fraction 60 to 65% and mild left atrial enlargement.  Holter monitor March 2019 showed sinus rhythm with occasional PAC and PVC.  Nuclear study April 2019 showed ejection fraction 73% and no ischemia or infarction.  Patient also with history of chronic COPD and paralyzed left hemidiaphragm.  Since last seen  she has had 3 nosebleeds.  She does have dyspnea which is chronic.  There is no chest pain, palpitations or syncope.  Chronic mild pedal edema.  Current Outpatient Medications  Medication Sig Dispense Refill  . ACCU-CHEK FASTCLIX LANCETS MISC USE TO TEST BLOOD GLUCOSE TWICE DAILY 204 each 3  . ACCU-CHEK SMARTVIEW test strip USE TO TEST BLOOD GLUCOSE 1-2 TIMES DAILY 200 each 3  . albuterol (VENTOLIN HFA) 108 (90 Base) MCG/ACT inhaler INHALE 2 PUFFS INTO THE LUNGS EVERY 4 (FOUR) HOURS AS NEEDED FOR WHEEZING OR SHORTNESS OF BREATH. 1 Inhaler 5  . AMBULATORY NON FORMULARY MEDICATION Accu-Chek FastClix Lancets Drum. Use to check blood sugar twice a day. Type 2 diabetes. 5 Units 11  . AMBULATORY NON FORMULARY MEDICATION Accu-Chek SmartView glucose testing strips. Use to check blood sugar twice a day.  Type 2 diabetes. 100 Units 11  . apixaban (ELIQUIS) 5 MG TABS tablet Take 1 tablet (5 mg total) by mouth 2 (two) times daily. 180 tablet 3  . benzonatate (TESSALON) 200 MG capsule Take 1 capsule (200 mg total) by mouth 3 (three) times daily as needed for cough. 20 capsule 1  . Blood Glucose Monitoring Suppl (ACCU-CHEK NANO SMARTVIEW) w/Device KIT USE TO TEST BLOOD GLUCOSE TWICE DAILY 1 kit 0  . budesonide-formoterol (SYMBICORT) 160-4.5 MCG/ACT inhaler Inhale 2 puffs, then rinse mouth, twice daily 3 Inhaler 4  . cholecalciferol (VITAMIN D) 1000 units tablet Take 1,000 Units by mouth at bedtime.     . diltiazem (CARDIZEM CD) 240 MG 24 hr capsule Take 1 capsule (240 mg total) by mouth daily. (Patient taking  differently: Take 240 mg by mouth at bedtime. ) 90 capsule 3  . furosemide (LASIX) 20 MG tablet Take 1 tablet (20 mg total) by mouth 2 (two) times daily. (Patient taking differently: Take 20 mg by mouth daily. ) 180 tablet 1  . Glucosamine-Chondroit-Vit C-Mn (GLUCOSAMINE CHONDR 500 COMPLEX PO) Take 1 tablet by mouth daily.     . hydrochlorothiazide (MICROZIDE) 12.5 MG capsule Take 1 capsule (12.5 mg total) by mouth daily. 90 capsule 0  . ipratropium-albuterol (DUONEB) 0.5-2.5 (3) MG/3ML SOLN Take 3 mLs by nebulization every 4 (four) hours as needed. 360 mL 3  . Lancets Misc. (ACCU-CHEK FASTCLIX LANCET) KIT USE TO TEST BLOOD GLUCOSE TWICE DAILY 1 kit 0  . latanoprost (XALATAN) 0.005 % ophthalmic solution Place 1 drop into both eyes at bedtime.    . Lutein (CVS LUTEIN) 40 MG CAPS Take 1 capsule by mouth daily.    . metFORMIN (GLUCOPHAGE) 500 MG tablet TAKE 1 TABLET TWICE DAILY WITH MEALS 180 tablet 0  . metoprolol succinate (TOPROL-XL) 25 MG 24 hr tablet TAKE 1 TABLET (25 MG TOTAL) BY MOUTH DAILY. 90 tablet 2  . Multiple Vitamins-Minerals (PRESERVISION AREDS PO) Take 1 capsule by mouth 2 (two) times daily. Areds Preservision    . omeprazole (PRILOSEC) 20 MG capsule Take 1 capsule (20 mg total) by mouth 2 (two) times daily before a meal. 180 capsule 3  . ondansetron (ZOFRAN) 4 MG tablet Take 1 tablet (4 mg total) by mouth   every 6 (six) hours as needed for nausea or vomiting. 20 tablet 0  . OXYGEN Inhale 3 L into the lungs.     . potassium chloride (K-DUR) 10 MEQ tablet TAKE 1 TABLET EVERY DAY 90 tablet 0  . Respiratory Therapy Supplies (FLUTTER) DEVI Blow through 4 times per set, 3 sets per day 1 each 0   No current facility-administered medications for this visit.      Past Medical History:  Diagnosis Date  . A-fib (HCC)   . Atrial fibrillation (HCC)   . Chicken pox   . COPD (chronic obstructive pulmonary disease) (HCC)   . Diabetes (HCC)   . Fibrocystic breast determined by biopsy 1977  .  GERD (gastroesophageal reflux disease)   . Glaucoma   . H/O hernia repair 2006  . H/O left breast biopsy 1982  . Incisional hernia   . Lung disease    Paralyzed left hemidiaphragm  . S/P scar revision   . Uterine cancer (HCC)     Past Surgical History:  Procedure Laterality Date  . APPENDECTOMY  1966  . BREAST BIOPSY    . CATARACT EXTRACTION  2004,2005  . CHOLECYSTECTOMY  1975  . HERNIA REPAIR  2006  . HIATAL HERNIA REPAIR  1966  . NISSEN FUNDOPLICATION    . TOTAL ABDOMINAL HYSTERECTOMY W/ BILATERAL SALPINGOOPHORECTOMY  1974   hx of cancer     Social History   Socioeconomic History  . Marital status: Married    Spouse name: Not on file  . Number of children: Not on file  . Years of education: Not on file  . Highest education level: Not on file  Occupational History  . Not on file  Social Needs  . Financial resource strain: Not on file  . Food insecurity:    Worry: Not on file    Inability: Not on file  . Transportation needs:    Medical: Not on file    Non-medical: Not on file  Tobacco Use  . Smoking status: Former Smoker    Packs/day: 0.50    Years: 20.00    Pack years: 10.00    Types: Cigarettes    Last attempt to quit: 08/23/1989    Years since quitting: 28.2  . Smokeless tobacco: Never Used  Substance and Sexual Activity  . Alcohol use: No    Alcohol/week: 0.0 standard drinks  . Drug use: No  . Sexual activity: Never    Partners: Male  Lifestyle  . Physical activity:    Days per week: Not on file    Minutes per session: Not on file  . Stress: Not on file  Relationships  . Social connections:    Talks on phone: Not on file    Gets together: Not on file    Attends religious service: Not on file    Active member of club or organization: Not on file    Attends meetings of clubs or organizations: Not on file    Relationship status: Not on file  . Intimate partner violence:    Fear of current or ex partner: Not on file    Emotionally abused: Not on  file    Physically abused: Not on file    Forced sexual activity: Not on file  Other Topics Concern  . Not on file  Social History Narrative  . Not on file    Family History  Problem Relation Age of Onset  . Breast cancer Mother   . Arthritis Mother   .   Stroke Mother   . Heart attack Mother   . Heart disease Mother   . Alcohol abuse Brother     ROS: no fevers or chills, productive cough, hemoptysis, dysphasia, odynophagia, melena, hematochezia, dysuria, hematuria, rash, seizure activity, orthopnea, PND, pedal edema, claudication. Remaining systems are negative.  Physical Exam: Well-developed well-nourished in no acute distress.  Skin is warm and dry.  HEENT is normal.  Neck is supple.  Chest is clear to auscultation with normal expansion.  Cardiovascular exam is regular rate and rhythm.  Abdominal exam nontender or distended. No masses palpated. Extremities show 1+ edema. neuro grossly intact  ECG-sinus rhythm at a rate of 73.  No ST changes.  Personally reviewed  A/P  1 paroxysmal atrial fibrillation-patient is in sinus rhythm today.  We will continue with present dose of metoprolol and Cardizem for rate control if atrial fibrillation recurs.  Continue Xarelto.  We had a long discussion today about watchman.  She inquired if she would be a candidate for this given nosebleeds.  I told her we could arrange for this evaluation at [facility] if she was interested.  However she declined she would prefer all procedures to be performed in [place] at this point.  If she has more frequent nosebleeds in the future we could consider discontinuing anticoagulation (with the realization that risk of CVA would be higher) versus referral to [facility] for consideration of watchman.  2 COPD-managed by primary care.  Continue home oxygen.  3 paralyzed diaphragm  4 diabetes mellitus-Per primary care.  [name], MD

## 2017-11-19 DIAGNOSIS — H401131 Primary open-angle glaucoma, bilateral, mild stage: Secondary | ICD-10-CM | POA: Diagnosis not present

## 2017-11-29 ENCOUNTER — Other Ambulatory Visit: Payer: Self-pay | Admitting: Adult Health

## 2017-11-29 DIAGNOSIS — E119 Type 2 diabetes mellitus without complications: Secondary | ICD-10-CM

## 2017-12-02 ENCOUNTER — Encounter: Payer: Self-pay | Admitting: Adult Health

## 2017-12-03 NOTE — Telephone Encounter (Signed)
Sent to the pharmacy by e-scribe. 

## 2017-12-04 DIAGNOSIS — J449 Chronic obstructive pulmonary disease, unspecified: Secondary | ICD-10-CM | POA: Diagnosis not present

## 2017-12-06 ENCOUNTER — Telehealth: Payer: Self-pay

## 2017-12-06 NOTE — Telephone Encounter (Signed)
Patient assistance form completed and signed by Dr.Crenshaw for Becky Newton. Form an pt financial documentation faxed to [place] fax # [phone], phone [phone]

## 2017-12-11 ENCOUNTER — Ambulatory Visit (INDEPENDENT_AMBULATORY_CARE_PROVIDER_SITE_OTHER): Payer: Medicare HMO | Admitting: Adult Health

## 2017-12-11 ENCOUNTER — Encounter: Payer: Self-pay | Admitting: Adult Health

## 2017-12-11 VITALS — BP 110/60 | HR 79 | Temp 97.6°F

## 2017-12-11 DIAGNOSIS — E119 Type 2 diabetes mellitus without complications: Secondary | ICD-10-CM | POA: Diagnosis not present

## 2017-12-11 LAB — POCT GLYCOSYLATED HEMOGLOBIN (HGB A1C): HbA1c, POC (controlled diabetic range): 6.2 % (ref 0.0–7.0)

## 2017-12-11 NOTE — Progress Notes (Signed)
Subjective:    Patient ID: Becky Newton, female    DOB: July 30, 1933, 84 y.o.   MRN: [id number]  HPI 84 year old female who  has a past medical history of A-fib ([place]), Atrial fibrillation ([place]), Chicken pox, COPD (chronic obstructive pulmonary disease) ([place]), Diabetes ([place]), Fibrocystic breast determined by biopsy (1977), GERD (gastroesophageal reflux disease), Glaucoma, H/O hernia repair (2006), H/O left breast biopsy (1982), Incisional hernia, Lung disease, S/P scar revision, and Uterine cancer ([place]).  She presents to the office today for three month follow up regarding diabetes.  She has been well controlled with metformin 500 mg twice daily.  She does monitor her blood sugars at home on a regular basis and reports readings usually around 130.  Lab Results  Component Value Date   HGBA1C 6.3 09/11/2017    Review of Systems See HPI   Past Medical History:  Diagnosis Date  . A-fib ([place])   . Atrial fibrillation ([place])   . Chicken pox   . COPD (chronic obstructive pulmonary disease) ([place])   . Diabetes ([place])   . Fibrocystic breast determined by biopsy 1977  . GERD (gastroesophageal reflux disease)   . Glaucoma   . H/O hernia repair 2006  . H/O left breast biopsy 1982  . Incisional hernia   . Lung disease    Paralyzed left hemidiaphragm  . S/P scar revision   . Uterine cancer [facility])     Social History   Socioeconomic History  . Marital status: Married    Spouse name: Not on file  . Number of children: Not on file  . Years of education: Not on file  . Highest education level: Not on file  Occupational History  . Not on file  Social Needs  . Financial resource strain: Not on file  . Food insecurity:    Worry: Not on file    Inability: Not on file  . Transportation needs:    Medical: Not on file    Non-medical: Not on file  Tobacco Use  . Smoking status: Former Smoker    Packs/day: 0.50    Years: 20.00    Pack years: 10.00    Types: Cigarettes    Last attempt to  quit: 08/23/1989    Years since quitting: 28.3  . Smokeless tobacco: Never Used  Substance and Sexual Activity  . Alcohol use: No    Alcohol/week: 0.0 standard drinks  . Drug use: No  . Sexual activity: Never    Partners: Male  Lifestyle  . Physical activity:    Days per week: Not on file    Minutes per session: Not on file  . Stress: Not on file  Relationships  . Social connections:    Talks on phone: Not on file    Gets together: Not on file    Attends religious service: Not on file    Active member of club or organization: Not on file    Attends meetings of clubs or organizations: Not on file    Relationship status: Not on file  . Intimate partner violence:    Fear of current or ex partner: Not on file    Emotionally abused: Not on file    Physically abused: Not on file    Forced sexual activity: Not on file  Other Topics Concern  . Not on file  Social History Narrative  . Not on file    Past Surgical History:  Procedure Laterality Date  . APPENDECTOMY  1966  .  BREAST BIOPSY    . CATARACT EXTRACTION  2004,2005  . CHOLECYSTECTOMY  1975  . HERNIA REPAIR  2006  . HIATAL HERNIA REPAIR  1966  . NISSEN FUNDOPLICATION    . TOTAL ABDOMINAL HYSTERECTOMY W/ BILATERAL SALPINGOOPHORECTOMY  1974   hx of cancer     Family History  Problem Relation Age of Onset  . Breast cancer Mother   . Arthritis Mother   . Stroke Mother   . Heart attack Mother   . Heart disease Mother   . Alcohol abuse Brother     Allergies  Allergen Reactions  . Ivp Dye [Iodinated Diagnostic Agents] Nausea And Vomiting    Current Outpatient Medications on File Prior to Visit  Medication Sig Dispense Refill  . ACCU-CHEK FASTCLIX LANCETS MISC USE TO TEST BLOOD GLUCOSE TWICE DAILY 204 each 3  . ACCU-CHEK SMARTVIEW test strip USE TO TEST BLOOD GLUCOSE 1-2 TIMES DAILY 200 each 3  . albuterol (VENTOLIN HFA) 108 (90 Base) MCG/ACT inhaler INHALE 2 PUFFS INTO THE LUNGS EVERY 4 (FOUR) HOURS AS NEEDED FOR  WHEEZING OR SHORTNESS OF BREATH. 1 Inhaler 5  . AMBULATORY NON FORMULARY MEDICATION Accu-Chek FastClix Lancets Drum. Use to check blood sugar twice a day. Type 2 diabetes. 5 Units 11  . AMBULATORY NON FORMULARY MEDICATION Accu-Chek SmartView glucose testing strips. Use to check blood sugar twice a day.  Type 2 diabetes. 100 Units 11  . apixaban (ELIQUIS) 5 MG TABS tablet Take 1 tablet (5 mg total) by mouth 2 (two) times daily. 180 tablet 3  . benzonatate (TESSALON) 200 MG capsule Take 1 capsule (200 mg total) by mouth 3 (three) times daily as needed for cough. 20 capsule 1  . Blood Glucose Monitoring Suppl (ACCU-CHEK NANO SMARTVIEW) w/Device KIT USE TO TEST BLOOD GLUCOSE TWICE DAILY 1 kit 0  . budesonide-formoterol (SYMBICORT) 160-4.5 MCG/ACT inhaler Inhale 2 puffs, then rinse mouth, twice daily 3 Inhaler 4  . cholecalciferol (VITAMIN D) 1000 units tablet Take 1,000 Units by mouth at bedtime.     Marland Kitchen diltiazem (CARDIZEM CD) 240 MG 24 hr capsule Take 1 capsule (240 mg total) by mouth daily. (Patient taking differently: Take 240 mg by mouth at bedtime. ) 90 capsule 3  . furosemide (LASIX) 20 MG tablet Take 1 tablet (20 mg total) by mouth 2 (two) times daily. (Patient taking differently: Take 20 mg by mouth daily. ) 180 tablet 1  . Glucosamine-Chondroit-Vit C-Mn (GLUCOSAMINE CHONDR 500 COMPLEX PO) Take 1 tablet by mouth daily.     . hydrochlorothiazide (MICROZIDE) 12.5 MG capsule Take 1 capsule (12.5 mg total) by mouth daily. 90 capsule 0  . ipratropium-albuterol (DUONEB) 0.5-2.5 (3) MG/3ML SOLN Take 3 mLs by nebulization every 4 (four) hours as needed. 360 mL 3  . Lancets Misc. (ACCU-CHEK FASTCLIX LANCET) KIT USE TO TEST BLOOD GLUCOSE TWICE DAILY 1 kit 0  . latanoprost (XALATAN) 0.005 % ophthalmic solution Place 1 drop into both eyes at bedtime.    . Lutein (CVS LUTEIN) 40 MG CAPS Take 1 capsule by mouth daily.    . metFORMIN (GLUCOPHAGE) 500 MG tablet TAKE 1 TABLET TWICE DAILY WITH MEALS 180 tablet 0    . metoprolol succinate (TOPROL-XL) 25 MG 24 hr tablet TAKE 1 TABLET (25 MG TOTAL) BY MOUTH DAILY. 90 tablet 2  . Multiple Vitamins-Minerals (PRESERVISION AREDS PO) Take 1 capsule by mouth 2 (two) times daily. Areds Preservision    . omeprazole (PRILOSEC) 20 MG capsule Take 1 capsule (20 mg total) by  mouth 2 (two) times daily before a meal. 180 capsule 3  . ondansetron (ZOFRAN) 4 MG tablet Take 1 tablet (4 mg total) by mouth every 6 (six) hours as needed for nausea or vomiting. 20 tablet 0  . OXYGEN Inhale 3 L into the lungs.     . potassium chloride (K-DUR) 10 MEQ tablet TAKE 1 TABLET EVERY DAY 90 tablet 0  . Respiratory Therapy Supplies (FLUTTER) DEVI Blow through 4 times per set, 3 sets per day 1 each 0   No current facility-administered medications on file prior to visit.     There were no vitals taken for this visit.      Objective:   Physical Exam  Constitutional: She is oriented to person, place, and time. She appears well-developed and well-nourished. No distress.  Cardiovascular: Normal rate, regular rhythm, normal heart sounds and intact distal pulses.  Pulmonary/Chest:  3 l via [place]   Musculoskeletal:  In motorized wheelchair   Neurological: She is alert and oriented to person, place, and time.  Skin: She is not diaphoretic.  Psychiatric: She has a normal mood and affect. Her behavior is normal. Judgment and thought content normal.  Nursing note and vitals reviewed.     Assessment & Plan:  1. Type 2 diabetes mellitus without complication, without long-term current use of insulin (HCC)  - POCT A1C - 6.2  - Continue with current therapy  - Follow up in 6 months or sooner if needed  [name], NP

## 2017-12-30 NOTE — Progress Notes (Signed)
HPI: Follow-up atrial fibrillation. Last echocardiogram February 2019 showed ejection fraction 60 to 65% and mild left atrial enlargement. Holter monitor March 2019 showed sinus rhythm with occasional PAC and PVC. Nuclear study April 2019 showed ejection fraction 73% and no ischemia or infarction. Patient also with history of chronic COPD and paralyzed left hemidiaphragm. Since last seenshe does have dyspnea.  Several days ago she had an episode of chest pain described as sharp lasting 30 minutes.  She does not have exertional chest pain.  She denies increased lower extremity edema.  Current Outpatient Medications  Medication Sig Dispense Refill  . ACCU-CHEK FASTCLIX LANCETS MISC USE TO TEST BLOOD GLUCOSE TWICE DAILY 204 each 3  . ACCU-CHEK SMARTVIEW test strip USE TO TEST BLOOD GLUCOSE 1-2 TIMES DAILY 200 each 3  . albuterol (VENTOLIN HFA) 108 (90 Base) MCG/ACT inhaler INHALE 2 PUFFS INTO THE LUNGS EVERY 4 (FOUR) HOURS AS NEEDED FOR WHEEZING OR SHORTNESS OF BREATH. 1 Inhaler 5  . AMBULATORY NON FORMULARY MEDICATION Accu-Chek FastClix Lancets Drum. Use to check blood sugar twice a day. Type 2 diabetes. 5 Units 11  . AMBULATORY NON FORMULARY MEDICATION Accu-Chek SmartView glucose testing strips. Use to check blood sugar twice a day.  Type 2 diabetes. 100 Units 11  . apixaban (ELIQUIS) 5 MG TABS tablet Take 1 tablet (5 mg total) by mouth 2 (two) times daily. 180 tablet 3  . benzonatate (TESSALON) 200 MG capsule Take 1 capsule (200 mg total) by mouth 3 (three) times daily as needed for cough. 20 capsule 1  . Blood Glucose Monitoring Suppl (ACCU-CHEK NANO SMARTVIEW) w/Device KIT USE TO TEST BLOOD GLUCOSE TWICE DAILY 1 kit 0  . budesonide-formoterol (SYMBICORT) 160-4.5 MCG/ACT inhaler Inhale 2 puffs, then rinse mouth, twice daily 3 Inhaler 4  . cholecalciferol (VITAMIN D) 1000 units tablet Take 1,000 Units by mouth at bedtime.     Marland Kitchen diltiazem (CARDIZEM CD) 240 MG 24 hr capsule Take 1 capsule  (240 mg total) by mouth daily. (Patient taking differently: Take 240 mg by mouth at bedtime. ) 90 capsule 3  . furosemide (LASIX) 20 MG tablet Take 1 tablet (20 mg total) by mouth 2 (two) times daily. (Patient taking differently: Take 20 mg by mouth daily. ) 180 tablet 1  . Glucosamine-Chondroit-Vit C-Mn (GLUCOSAMINE CHONDR 500 COMPLEX PO) Take 1 tablet by mouth daily.     . hydrochlorothiazide (MICROZIDE) 12.5 MG capsule Take 1 capsule (12.5 mg total) by mouth daily. 90 capsule 0  . ipratropium-albuterol (DUONEB) 0.5-2.5 (3) MG/3ML SOLN Take 3 mLs by nebulization every 4 (four) hours as needed. 360 mL 3  . Lancets Misc. (ACCU-CHEK FASTCLIX LANCET) KIT USE TO TEST BLOOD GLUCOSE TWICE DAILY 1 kit 0  . latanoprost (XALATAN) 0.005 % ophthalmic solution Place 1 drop into both eyes at bedtime.    . Lutein (CVS LUTEIN) 40 MG CAPS Take 1 capsule by mouth daily.    . metFORMIN (GLUCOPHAGE) 500 MG tablet TAKE 1 TABLET TWICE DAILY WITH MEALS 180 tablet 0  . metoprolol succinate (TOPROL-XL) 25 MG 24 hr tablet TAKE 1 TABLET (25 MG TOTAL) BY MOUTH DAILY. 90 tablet 2  . Multiple Vitamins-Minerals (PRESERVISION AREDS PO) Take 1 capsule by mouth 2 (two) times daily. Areds Preservision    . omeprazole (PRILOSEC) 20 MG capsule Take 1 capsule (20 mg total) by mouth 2 (two) times daily before a meal. 180 capsule 3  . ondansetron (ZOFRAN) 4 MG tablet Take 1 tablet (4 mg total)  by mouth every 6 (six) hours as needed for nausea or vomiting. 20 tablet 0  . OXYGEN Inhale 3 L into the lungs.     . potassium chloride (K-DUR) 10 MEQ tablet TAKE 1 TABLET EVERY DAY 90 tablet 0  . Respiratory Therapy Supplies (FLUTTER) DEVI Blow through 4 times per set, 3 sets per day 1 each 0   No current facility-administered medications for this visit.      Past Medical History:  Diagnosis Date  . A-fib ([place])   . Atrial fibrillation ([place])   . Chicken pox   . COPD (chronic obstructive pulmonary disease) ([place])   . Diabetes ([place])   .  Fibrocystic breast determined by biopsy 1977  . GERD (gastroesophageal reflux disease)   . Glaucoma   . H/O hernia repair 2006  . H/O left breast biopsy 1982  . Incisional hernia   . Lung disease    Paralyzed left hemidiaphragm  . S/P scar revision   . Uterine cancer [facility])     Past Surgical History:  Procedure Laterality Date  . APPENDECTOMY  1966  . BREAST BIOPSY    . CATARACT EXTRACTION  2004,2005  . CHOLECYSTECTOMY  1975  . HERNIA REPAIR  2006  . HIATAL HERNIA REPAIR  1966  . NISSEN FUNDOPLICATION    . TOTAL ABDOMINAL HYSTERECTOMY W/ BILATERAL SALPINGOOPHORECTOMY  1974   hx of cancer     Social History   Socioeconomic History  . Marital status: Married    Spouse name: Not on file  . Number of children: Not on file  . Years of education: Not on file  . Highest education level: Not on file  Occupational History  . Not on file  Social Needs  . Financial resource strain: Not on file  . Food insecurity:    Worry: Not on file    Inability: Not on file  . Transportation needs:    Medical: Not on file    Non-medical: Not on file  Tobacco Use  . Smoking status: Former Smoker    Packs/day: 0.50    Years: 20.00    Pack years: 10.00    Types: Cigarettes    Last attempt to quit: 08/23/1989    Years since quitting: 28.4  . Smokeless tobacco: Never Used  Substance and Sexual Activity  . Alcohol use: No    Alcohol/week: 0.0 standard drinks  . Drug use: No  . Sexual activity: Never    Partners: Male  Lifestyle  . Physical activity:    Days per week: Not on file    Minutes per session: Not on file  . Stress: Not on file  Relationships  . Social connections:    Talks on phone: Not on file    Gets together: Not on file    Attends religious service: Not on file    Active member of club or organization: Not on file    Attends meetings of clubs or organizations: Not on file    Relationship status: Not on file  . Intimate partner violence:    Fear of current or ex  partner: Not on file    Emotionally abused: Not on file    Physically abused: Not on file    Forced sexual activity: Not on file  Other Topics Concern  . Not on file  Social History Narrative  . Not on file    Family History  Problem Relation Age of Onset  . Breast cancer Mother   . Arthritis Mother   .  Stroke Mother   . Heart attack Mother   . Heart disease Mother   . Alcohol abuse Brother     ROS: no fevers or chills, productive cough, hemoptysis, dysphasia, odynophagia, melena, hematochezia, dysuria, hematuria, rash, seizure activity, orthopnea, PND, claudication. Remaining systems are negative.  Physical Exam: Well-developed obese in no acute distress.  Skin is warm and dry.  HEENT is normal.  Neck is supple.  Chest is clear to auscultation with normal expansion.  Cardiovascular exam is regular rate and rhythm.  Abdominal exam nontender or distended. No masses palpated. Extremities show trace edema. neuro grossly intact  Electrocardiogram-sinus rhythm with no ST changes.  Personally reviewed.  A/P  1 paroxysmal atrial fibrillation-patient remains in sinus rhythm today on examination.  Continue metoprolol and Cardizem for rate control if atrial fibrillation recurs.  Continue Xarelto.  She has had several nosebleeds since last seen.  This is likely secondary to nasal cannula oxygen drying her nasal passages.  If these become severe would need to consider discontinuing Xarelto.  She would like to continue for now.  We discussed referral to [facility] for [place] device.  However she prefers present management and all procedures to be performed in [place].  She will consider this in the future.  2 chronic O2 dependent COPD-management per primary care.  3 history of paralyzed diaphragm  4 chest pain-symptoms atypical.  Electrocardiogram with no ST changes.  Previous nuclear study negative.  We will continue present management and follow.  [name],  MD

## 2018-01-01 ENCOUNTER — Encounter: Payer: Self-pay | Admitting: Cardiology

## 2018-01-03 DIAGNOSIS — J449 Chronic obstructive pulmonary disease, unspecified: Secondary | ICD-10-CM | POA: Diagnosis not present

## 2018-01-06 ENCOUNTER — Telehealth: Payer: Self-pay | Admitting: Pulmonary Disease

## 2018-01-06 NOTE — Telephone Encounter (Signed)
Called and spoke with patient she stated that she would like to have forms to fill out for patient assistance. She stated that she can pick up the forms this week to have them filled out. Printed forms and placed up front. Nothing further needed.

## 2018-01-09 ENCOUNTER — Encounter: Payer: Self-pay | Admitting: Cardiology

## 2018-01-09 ENCOUNTER — Ambulatory Visit: Payer: Medicare HMO | Admitting: Cardiology

## 2018-01-09 VITALS — BP 106/64 | HR 79 | Ht 61.5 in | Wt 222.0 lb

## 2018-01-09 DIAGNOSIS — I480 Paroxysmal atrial fibrillation: Secondary | ICD-10-CM | POA: Diagnosis not present

## 2018-01-09 DIAGNOSIS — R072 Precordial pain: Secondary | ICD-10-CM | POA: Diagnosis not present

## 2018-01-09 NOTE — Patient Instructions (Signed)
Medication Instructions:  NO CHANGE If you need a refill on your cardiac medications before your next appointment, please call your pharmacy.   Lab work: If you have labs (blood work) drawn today and your tests are completely normal, you will receive your results only by: Marland Kitchen MyChart Message (if you have MyChart) OR . A paper copy in the mail If you have any lab test that is abnormal or we need to change your treatment, we will call you to review the results.  Follow-Up: At [facility], you and your health needs are our priority.  As part of our continuing mission to provide you with exceptional heart care, we have created designated Provider Care Teams.  These Care Teams include your primary Cardiologist (physician) and Advanced Practice Providers (APPs -  Physician Assistants and Nurse Practitioners) who all work together to provide you with the care you need, when you need it. You will need a follow up appointment in 6 months.  Please call our office 2 months in advance to schedule this appointment.  You may see [name], MD or one of the following Advanced Practice Providers on your designated Care Team:   Kerin Ransom, PA-C Roby Lofts, [place] . Sande Rives, PA-C

## 2018-01-10 ENCOUNTER — Encounter: Payer: Self-pay | Admitting: Adult Health

## 2018-01-10 ENCOUNTER — Other Ambulatory Visit: Payer: Self-pay | Admitting: *Deleted

## 2018-01-10 MED ORDER — DILTIAZEM HCL ER COATED BEADS 120 MG PO CP24: 120.0000 mg | ORAL_CAPSULE | Freq: Every day | ORAL | 3 refills | Status: DC | PRN

## 2018-01-13 MED ORDER — POTASSIUM CHLORIDE ER 10 MEQ PO TBCR: 10.0000 meq | EXTENDED_RELEASE_TABLET | Freq: Every day | ORAL | 2 refills | Status: DC

## 2018-01-13 NOTE — Telephone Encounter (Signed)
Medication sent to the pharmacy by e-scribe.  Nothing further needed.

## 2018-01-14 ENCOUNTER — Other Ambulatory Visit: Payer: Self-pay | Admitting: *Deleted

## 2018-01-14 MED ORDER — DILTIAZEM HCL ER COATED BEADS 120 MG PO CP24: 120.0000 mg | ORAL_CAPSULE | Freq: Every day | ORAL | 3 refills | Status: DC | PRN

## 2018-01-16 ENCOUNTER — Telehealth: Payer: Self-pay | Admitting: Pulmonary Disease

## 2018-01-16 NOTE — Telephone Encounter (Addendum)
error

## 2018-01-16 NOTE — Telephone Encounter (Signed)
Application for symbicort patient assistance obtained. Remaining information filled out. Faxed to [place]. Will place in scan folder to have scanned in chart. Will route message to [place] for [place].

## 2018-01-17 NOTE — Telephone Encounter (Signed)
Call made to [place], fax note received. Application re-faxed. Will call back later to confirm fax received. Will leave message open.

## 2018-01-17 NOTE — Telephone Encounter (Signed)
ATC [employer] but was on hold for 10 mins will call back to make sure they received fax

## 2018-01-22 DIAGNOSIS — Z66 Do not resuscitate: Secondary | ICD-10-CM

## 2018-01-22 HISTORY — DX: Do not resuscitate: Z66

## 2018-02-03 DIAGNOSIS — J449 Chronic obstructive pulmonary disease, unspecified: Secondary | ICD-10-CM | POA: Diagnosis not present

## 2018-02-03 MED ORDER — BUDESONIDE-FORMOTEROL FUMARATE 160-4.5 MCG/ACT IN AERO: 2.0000 | INHALATION_SPRAY | Freq: Two times a day (BID) | RESPIRATORY_TRACT | 0 refills | Status: DC

## 2018-02-03 NOTE — Telephone Encounter (Signed)
Encounter was accidentally signed.  Due to Caryl Pina now working with Dr. Lake Bells instead of Burman Nieves, sending this to her. Caryl Pina, do you know if you still have patient assistance application that pt brought back after it was filled out and brought back to office 01/10/2018?  Pt has not heard anything from the manufacturer and I am wondering if the application was sent in for pt after it was filled out.

## 2018-02-04 ENCOUNTER — Telehealth: Payer: Self-pay | Admitting: Pulmonary Disease

## 2018-02-04 NOTE — Telephone Encounter (Signed)
Patient was given a new set of financial assistance forms after the first set could not be found. The original set was placed in Dr. Kathee Delton box but there was not Phone message put back to let clinical know that the forms had been placed in the box.//fmb

## 2018-02-04 NOTE — Telephone Encounter (Signed)
Application was found. Message sent to patient as well as a phone call.

## 2018-02-04 NOTE — Telephone Encounter (Signed)
Patient returned call, CB is [phone]

## 2018-02-04 NOTE — Telephone Encounter (Signed)
Attempted to call patient, no answer, left message.  Patient's application for [employer] & [place] is in the media file. It appears that it was faxed over on 01/17/18.  I have printed the application and will place in hold folder for Caryl Pina to follow up on.   Called [employer] to check on application status.  Spoke with Vicente Males, who stated that parts of the application didn't come through. Faxed over the entire application in full.  Caryl Pina please follow up in several days, thank you!

## 2018-02-04 NOTE — Telephone Encounter (Signed)
Patient's husband came up to the office for Symbicort assistance paperwork. He was upset that the previous paperwork has not been located. Apologized to him for the inconvenience.  Also let him know that Symbicort has gone generic so he might want to follow up with the pharmacy to see how much the generic will cost. Patient's husband reported that he will bring the completed paperwork by at the next appointment or when they are in this area.  Will route to [place] as FYI.

## 2018-02-05 ENCOUNTER — Other Ambulatory Visit: Payer: Self-pay

## 2018-02-05 MED ORDER — ALBUTEROL SULFATE HFA 108 (90 BASE) MCG/ACT IN AERS: INHALATION_SPRAY | RESPIRATORY_TRACT | 5 refills | Status: DC

## 2018-02-05 NOTE — Telephone Encounter (Signed)
Forms were just faxed yesterday evening at 4:49 per Burman Nieves.  I have not received any update.  We can let patient know that forms was faxed yesterday to [employer] as stated below.  I will continue to follow up and document as updates are received.  Thanks!

## 2018-02-05 NOTE — Telephone Encounter (Signed)
Caryl Pina, do you have any update on these forms?

## 2018-02-05 NOTE — Telephone Encounter (Signed)
Called and spoke with patient, advised her of message below. She verbalized understanding.

## 2018-02-06 ENCOUNTER — Other Ambulatory Visit: Payer: Self-pay | Admitting: Pulmonary Disease

## 2018-02-06 MED ORDER — ALBUTEROL SULFATE HFA 108 (90 BASE) MCG/ACT IN AERS: INHALATION_SPRAY | RESPIRATORY_TRACT | 5 refills | Status: DC

## 2018-02-28 ENCOUNTER — Encounter: Payer: Self-pay | Admitting: Adult Health

## 2018-02-28 DIAGNOSIS — E119 Type 2 diabetes mellitus without complications: Secondary | ICD-10-CM

## 2018-02-28 MED ORDER — METFORMIN HCL 500 MG PO TABS: 500.0000 mg | ORAL_TABLET | Freq: Two times a day (BID) | ORAL | 0 refills | Status: DC

## 2018-02-28 NOTE — Telephone Encounter (Signed)
Rx sent to the pharmacy.

## 2018-03-06 DIAGNOSIS — J449 Chronic obstructive pulmonary disease, unspecified: Secondary | ICD-10-CM | POA: Diagnosis not present

## 2018-03-07 ENCOUNTER — Telehealth: Payer: Self-pay | Admitting: Cardiology

## 2018-03-07 MED ORDER — APIXABAN 5 MG PO TABS: 5.0000 mg | ORAL_TABLET | Freq: Two times a day (BID) | ORAL | 3 refills | Status: DC

## 2018-03-07 NOTE — Telephone Encounter (Signed)
New Message    Patient calling to talk to Hilda Blades about the Eliquis they were conversing about through My chart.

## 2018-03-07 NOTE — Telephone Encounter (Signed)
Spoke with pt, script for eliquis sent to [facility] at patient request.

## 2018-03-14 ENCOUNTER — Encounter: Payer: Self-pay | Admitting: Adult Health

## 2018-03-14 DIAGNOSIS — R600 Localized edema: Secondary | ICD-10-CM

## 2018-03-14 MED ORDER — HYDROCHLOROTHIAZIDE 12.5 MG PO CAPS: 12.5000 mg | ORAL_CAPSULE | Freq: Every day | ORAL | 1 refills | Status: DC

## 2018-03-14 NOTE — Telephone Encounter (Signed)
Sent to the pharmacy by e-scribe. 

## 2018-04-04 DIAGNOSIS — J449 Chronic obstructive pulmonary disease, unspecified: Secondary | ICD-10-CM | POA: Diagnosis not present

## 2018-04-23 ENCOUNTER — Telehealth: Payer: Self-pay

## 2018-04-23 NOTE — Telephone Encounter (Signed)
Author phoned pt. to assess interest in rescheduling awv for virtual awv. No answer, author left detailed VM asking for return call.

## 2018-04-23 NOTE — Telephone Encounter (Signed)
Will hold for follow up - are providers doing their own AWVs while Raquel Sarna [facility]) is out?

## 2018-04-23 NOTE — Telephone Encounter (Signed)
Pt stated she would be okay with doing a Telephone appointment as she is not that good with the computer. Please advise.

## 2018-04-27 ENCOUNTER — Encounter: Payer: Self-pay | Admitting: Adult Health

## 2018-04-28 NOTE — Telephone Encounter (Signed)
Providers are doing their own AWVs at this time.   Will send to [facility] to check with [facility] prior to scheduling.

## 2018-04-30 NOTE — Telephone Encounter (Signed)
Ok to scheduled her for [employer] exam

## 2018-04-30 NOTE — Telephone Encounter (Signed)
Pt scheduled for 05/01/2018 for AWV.

## 2018-05-01 ENCOUNTER — Encounter: Payer: Self-pay | Admitting: Adult Health

## 2018-05-01 ENCOUNTER — Ambulatory Visit (INDEPENDENT_AMBULATORY_CARE_PROVIDER_SITE_OTHER): Payer: Medicare HMO | Admitting: Adult Health

## 2018-05-01 ENCOUNTER — Ambulatory Visit: Payer: Medicare HMO | Admitting: Adult Health

## 2018-05-01 ENCOUNTER — Other Ambulatory Visit: Payer: Self-pay

## 2018-05-01 VITALS — BP 135/61 | HR 70

## 2018-05-01 DIAGNOSIS — E119 Type 2 diabetes mellitus without complications: Secondary | ICD-10-CM | POA: Diagnosis not present

## 2018-05-01 DIAGNOSIS — J441 Chronic obstructive pulmonary disease with (acute) exacerbation: Secondary | ICD-10-CM

## 2018-05-01 DIAGNOSIS — I4891 Unspecified atrial fibrillation: Secondary | ICD-10-CM | POA: Diagnosis not present

## 2018-05-01 DIAGNOSIS — Z0000 Encounter for general adult medical examination without abnormal findings: Secondary | ICD-10-CM

## 2018-05-01 NOTE — Progress Notes (Signed)
Virtual Visit via Video Note  I connected with Becky Newton on 05/01/18 at 11:30 AM EDT by a video enabled telemedicine application and verified that I am speaking with the correct person using two identifiers.  Location patient: home Location provider:work or home office Persons participating in the virtual visit: patient, provider  I discussed the limitations of evaluation and management by telemedicine and the availability of in person appointments. The patient expressed understanding and agreed to proceed.     Subjective:  Patient presents today for their annual wellness visit.    Preventive Screening-Counseling & Management  Smoking Status: Former Smoker, quit in 1991 Second Hand Smoking status: No smokers in home  Risk Factors Regular exercise: none Diet: Spouse cooks, has three meals daily. Eats fruits and vegetables  Fall Risk: Unsteady gait, uses walker and has motorized wheelchair   Cardiac risk factors:  advanced age (older than 51 for men, 37 for women),  DM- Is checking her blood sugars at home on a regular basis and reports 30 day average is 123.  Lab Results  Component Value Date   HGBA1C 6.2 12/11/2017   AF sedentary lifestyle   Family History: Premature CV disease  Depression Screen None. PHQ2 0   Activities of Daily Living Independent ADLs and IADLs   Hearing Difficulties:patient declines  Cognitive Testing No reported trouble.   Normal 3 word recall  List the Names of Other Physician/Practitioners you currently use: 1.Cardiology - Dr. [name] 2. [facility]- last seen in January. No concern for diabetic retinopathy  3. Pulmonary - Dr. Annamaria Boots  Immunization History  Administered Date(s) Administered  . Influenza, High Dose Seasonal PF 09/20/2015, 09/30/2017  . Influenza,inj,Quad PF,6+ Mos 10/31/2016  . Influenza-Unspecified 10/03/2014  . Pneumococcal Conjugate-13 10/31/2016   Required Immunizations needed  today- UTD  Screening tests- up to date There are no preventive care reminders to display for this patient.  ROS- No pertinent positives discovered in course of AWV  The following were reviewed and entered/updated in epic: Past Medical History:  Diagnosis Date  . A-fib ([place])   . Atrial fibrillation ([place])   . Chicken pox   . COPD (chronic obstructive pulmonary disease) ([place])   . Diabetes ([place])   . Fibrocystic breast determined by biopsy 1977  . GERD (gastroesophageal reflux disease)   . Glaucoma   . H/O hernia repair 2006  . H/O left breast biopsy 1982  . Incisional hernia   . Lung disease    Paralyzed left hemidiaphragm  . S/P scar revision   . Uterine cancer [facility])    Patient Active Problem List   Diagnosis Date Noted  . S/p bilateral blepharoplasty 05/08/2017  . Atrial fibrillation with RVR ([place]) 03/09/2017  . Chronic diastolic CHF (congestive heart failure) ([place]) 03/09/2017  . Palpitation 03/09/2017  . COPD with acute exacerbation ([place]) 08/22/2016  . Epistaxis 03/05/2016  . Hematoma of leg, right, initial encounter 11/29/2015  . Ischemic optic neuropathy of right eye 11/29/2015  . Vitreous degeneration, bilateral 11/29/2015  . Overactive bladder 08/19/2015  . Lower extremity edema 08/19/2015  . Stasis dermatitis 08/19/2015  . Paroxysmal atrial fibrillation ([place]) 11/03/2014  . Chest pain 11/03/2014  . Morbid obesity ([place]) 11/03/2014  . Chronic respiratory failure ([place]) 09/02/2014  . OSA (obstructive sleep apnea) 09/02/2014  . Hypercalcemia 09/02/2014  . Asthma, chronic 08/24/2014  . Type 2 diabetes mellitus without complication ([place]) 56/21/3086  . History of gastric ulcer 08/24/2014  . Paralysis, diaphragm 08/24/2014  . History of Nissen  fundoplication 29/47/6546  . Pulmonary hypertension due to lung disease ([place]) 08/24/2014  . Open-angle glaucoma 08/24/2014   Past Surgical History:  Procedure Laterality Date  . APPENDECTOMY  1966  . BREAST BIOPSY    .  CATARACT EXTRACTION  2004,2005  . CHOLECYSTECTOMY  1975  . HERNIA REPAIR  2006  . HIATAL HERNIA REPAIR  1966  . NISSEN FUNDOPLICATION    . TOTAL ABDOMINAL HYSTERECTOMY W/ BILATERAL SALPINGOOPHORECTOMY  1974   hx of cancer     Family History  Problem Relation Age of Onset  . Breast cancer Mother   . Arthritis Mother   . Stroke Mother   . Heart attack Mother   . Heart disease Mother   . Alcohol abuse Brother     Medications- reviewed and updated Current Outpatient Medications  Medication Sig Dispense Refill  . ACCU-CHEK FASTCLIX LANCETS MISC USE TO TEST BLOOD GLUCOSE TWICE DAILY 204 each 3  . ACCU-CHEK SMARTVIEW test strip USE TO TEST BLOOD GLUCOSE 1-2 TIMES DAILY 200 each 3  . albuterol (VENTOLIN HFA) 108 (90 Base) MCG/ACT inhaler INHALE 2 PUFFS INTO THE LUNGS EVERY 4 (FOUR) HOURS AS NEEDED FOR WHEEZING OR SHORTNESS OF BREATH. 1 Inhaler 5  . AMBULATORY NON FORMULARY MEDICATION Accu-Chek FastClix Lancets Drum. Use to check blood sugar twice a day. Type 2 diabetes. 5 Units 11  . AMBULATORY NON FORMULARY MEDICATION Accu-Chek SmartView glucose testing strips. Use to check blood sugar twice a day.  Type 2 diabetes. 100 Units 11  . apixaban (ELIQUIS) 5 MG TABS tablet Take 1 tablet (5 mg total) by mouth 2 (two) times daily. 180 tablet 3  . benzonatate (TESSALON) 200 MG capsule Take 1 capsule (200 mg total) by mouth 3 (three) times daily as needed for cough. 20 capsule 1  . Blood Glucose Monitoring Suppl (ACCU-CHEK NANO SMARTVIEW) w/Device KIT USE TO TEST BLOOD GLUCOSE TWICE DAILY 1 kit 0  . budesonide-formoterol (SYMBICORT) 160-4.5 MCG/ACT inhaler Inhale 2 puffs, then rinse mouth, twice daily 3 Inhaler 4  . budesonide-formoterol (SYMBICORT) 160-4.5 MCG/ACT inhaler Inhale 2 puffs into the lungs 2 (two) times daily. 1 Inhaler 0  . cholecalciferol (VITAMIN D) 1000 units tablet Take 1,000 Units by mouth at bedtime.     Marland Kitchen diltiazem (CARDIZEM CD) 120 MG 24 hr capsule Take 1 capsule (120 mg total)  by mouth daily as needed (as needed for Atrial Fibrillation). 90 capsule 3  . diltiazem (CARDIZEM CD) 240 MG 24 hr capsule Take 1 capsule (240 mg total) by mouth daily. (Patient taking differently: Take 240 mg by mouth at bedtime. ) 90 capsule 3  . furosemide (LASIX) 20 MG tablet Take 1 tablet (20 mg total) by mouth 2 (two) times daily. (Patient taking differently: Take 20 mg by mouth daily. ) 180 tablet 1  . Glucosamine-Chondroit-Vit C-Mn (GLUCOSAMINE CHONDR 500 COMPLEX PO) Take 1 tablet by mouth daily.     . hydrochlorothiazide (MICROZIDE) 12.5 MG capsule Take 1 capsule (12.5 mg total) by mouth daily. 90 capsule 1  . ipratropium-albuterol (DUONEB) 0.5-2.5 (3) MG/3ML SOLN Take 3 mLs by nebulization every 4 (four) hours as needed. 360 mL 3  . Lancets Misc. (ACCU-CHEK FASTCLIX LANCET) KIT USE TO TEST BLOOD GLUCOSE TWICE DAILY 1 kit 0  . latanoprost (XALATAN) 0.005 % ophthalmic solution Place 1 drop into both eyes at bedtime.    . Lutein ([employer] LUTEIN) 40 MG CAPS Take 1 capsule by mouth daily.    . metFORMIN (GLUCOPHAGE) 500 MG tablet Take 1 tablet (  500 mg total) by mouth 2 (two) times daily with a meal. 180 tablet 0  . metoprolol succinate (TOPROL-XL) 25 MG 24 hr tablet TAKE 1 TABLET (25 MG TOTAL) BY MOUTH DAILY. 90 tablet 2  . Multiple Vitamins-Minerals (PRESERVISION AREDS PO) Take 1 capsule by mouth 2 (two) times daily. [place] Preservision    . omeprazole (PRILOSEC) 20 MG capsule Take 1 capsule (20 mg total) by mouth 2 (two) times daily before a meal. 180 capsule 3  . ondansetron (ZOFRAN) 4 MG tablet Take 1 tablet (4 mg total) by mouth every 6 (six) hours as needed for nausea or vomiting. 20 tablet 0  . OXYGEN Inhale 3 L into the lungs.     . potassium chloride (K-DUR) 10 MEQ tablet Take 1 tablet (10 mEq total) by mouth daily. 90 tablet 2  . Respiratory Therapy Supplies (FLUTTER) DEVI Blow through 4 times per set, 3 sets per day 1 each 0   No current facility-administered medications for this visit.      Allergies-reviewed and updated Allergies  Allergen Reactions  . Ivp Dye [Iodinated Diagnostic Agents] Nausea And Vomiting    Social History   Socioeconomic History  . Marital status: Married    Spouse name: Not on file  . Number of children: Not on file  . Years of education: Not on file  . Highest education level: Not on file  Occupational History  . Not on file  Social Needs  . Financial resource strain: Not on file  . Food insecurity:    Worry: Not on file    Inability: Not on file  . Transportation needs:    Medical: Not on file    Non-medical: Not on file  Tobacco Use  . Smoking status: Former Smoker    Packs/day: 0.50    Years: 20.00    Pack years: 10.00    Types: Cigarettes    Last attempt to quit: 08/23/1989    Years since quitting: 28.7  . Smokeless tobacco: Never Used  Substance and Sexual Activity  . Alcohol use: No    Alcohol/week: 0.0 standard drinks  . Drug use: No  . Sexual activity: Never    Partners: Male  Lifestyle  . Physical activity:    Days per week: Not on file    Minutes per session: Not on file  . Stress: Not on file  Relationships  . Social connections:    Talks on phone: Not on file    Gets together: Not on file    Attends religious service: Not on file    Active member of club or organization: Not on file    Attends meetings of clubs or organizations: Not on file    Relationship status: Not on file  Other Topics Concern  . Not on file  Social History Narrative  . Not on file    Objective: BP 135/61   Pulse 70   SpO2 95%  Gen: NAD, resting comfortably HEENT: Mucous membranes are moist. Oropharynx normal Neck: no thyromegaly CV: RRR no murmurs rubs or gallops Lungs: CTAB no crackles, wheeze, rhonchi. Nasal canula.  Abdomen: soft/nontender/nondistended/normal bowel sounds. No rebound or guarding.  Ext: no edema Skin: warm, dry Neuro: grossly normal, moves all extremities, PERRLA  Assessment/Plan:  Will have her  follow up for A1c check. Follow up with Cardiology as directed Work on chair exercises Continue with diabetic diet  Follow up with PCP as directed and MWE in one year    Return precautions advised.  I discussed the assessment and treatment plan with the patient. The patient was provided an opportunity to ask questions and all were answered. The patient agreed with the plan and demonstrated an understanding of the instructions.   The patient was advised to call back or seek an in-person evaluation if the symptoms worsen or if the condition fails to improve as anticipated.   [name], NP

## 2018-05-05 DIAGNOSIS — J449 Chronic obstructive pulmonary disease, unspecified: Secondary | ICD-10-CM | POA: Diagnosis not present

## 2018-05-09 ENCOUNTER — Other Ambulatory Visit: Payer: Self-pay | Admitting: Adult Health

## 2018-05-09 DIAGNOSIS — R600 Localized edema: Secondary | ICD-10-CM

## 2018-05-14 ENCOUNTER — Ambulatory Visit: Payer: Medicare HMO | Admitting: Pulmonary Disease

## 2018-05-19 ENCOUNTER — Ambulatory Visit: Payer: Medicare HMO | Admitting: Nurse Practitioner

## 2018-05-19 NOTE — Telephone Encounter (Signed)
Noted. Nothing further needed at this time.

## 2018-05-20 ENCOUNTER — Ambulatory Visit: Payer: Medicare HMO

## 2018-05-21 ENCOUNTER — Ambulatory Visit: Payer: Medicare HMO

## 2018-05-30 ENCOUNTER — Encounter: Payer: Self-pay | Admitting: Family Medicine

## 2018-05-30 ENCOUNTER — Other Ambulatory Visit: Payer: Self-pay | Admitting: Family Medicine

## 2018-05-30 ENCOUNTER — Telehealth: Payer: Self-pay | Admitting: Pulmonary Disease

## 2018-05-30 ENCOUNTER — Other Ambulatory Visit: Payer: Self-pay | Admitting: Adult Health

## 2018-05-30 DIAGNOSIS — E119 Type 2 diabetes mellitus without complications: Secondary | ICD-10-CM

## 2018-05-30 NOTE — Telephone Encounter (Signed)
MyChart message sent to the pt to set up future A1C and follow up visit.  Will make sure she views.

## 2018-05-30 NOTE — Telephone Encounter (Signed)
Primary Pulmonologist: BQ Last office visit and with whom: 09/30/17 with BQ What do we see them for (pulmonary problems): atelectasis left lung/ chronic resp failure Last OV assessment/plan: Instructions  Return in about 4 months (around 01/30/2018).  Asthma-possible COPD: Take Symbicort 2 puffs twice a day no matter how you feel through a spacer Use albuterol through a spacer If you find that your breathing has improved after 4 weeks of using Symbicort routinely call me and we can work with your insurance company to prescribe their preferred alternative agent for a controller medicine for your asthma and COPD. High-dose flu shot today Practice good hand hygiene Stay active  Chronic respiratory failure with hypoxemia due to left hemidiaphragm paralysis: Continue to exercise as much as possible Try your best to keep your weight down Use 2 L continuously  We will see you back in 4 months or sooner if needed       Was appointment offered to patient (explain)?  Pt wants recommendations   Reason for call: Called and spoke with pt asking her how long she has had the problems with her becoming very SOB and pt stated it has been like this since last visit with BQ 09/30/17. Pt states she can walk to the bathroom using her walker with her O2 on and her sats will drop down to the high 70s to 80s on her O2 and pt stated she could be doing 3-3.5L and it will take about 10-64mn for her O2 to come back up to a good range. Pt stated she is having to use her ventolin inhaler at least twice daily and is using her Symbicort as prescribed. Pt does rise the head of the bed at night and has done everything that she has been told to do. Pt states her breathing has not become worse, it has just maintained this way since her last OV and since it is now going on this long, she states it is beginning to scare her and her husband as well.  Pt also stated she still is having nosebleeds which she says she believes is due  to her oxygen. She uses the large concentrator during the day since it has a long O2 cord she can use with it and she uses her small POC at night due to it puffing oxygen when she breathes. Pt states at night is when she is having the problems with the nosebleeds and states it is usually her left nostril that the bleeding comes from. Pt states she will put some vaseline or some other substance on the nostril that she has been told prior to put on it to see if that would help with the bleeding.  Pt is wanting to know recommendations based on all that has been going on. BAaron Edelman please advise on this for pt. Thanks!

## 2018-05-30 NOTE — Telephone Encounter (Signed)
I would recommend the patient is scheduled for a follow-up in our office.  Patient was supposed to follow-up in January/2020 anyway.  Patient is due for a follow-up.  Screen for COVID.  Ensure patient has been afebrile.  [name], FNP

## 2018-05-30 NOTE — Telephone Encounter (Signed)
I spoke with pt and advised recommendations. Pt understand and stated she did not have a fever and her covid screening was negative. Pt made an appr with Aaron Edelman on 06/02/2018 at 11:30. Nothing further is needed.

## 2018-06-02 ENCOUNTER — Other Ambulatory Visit: Payer: Self-pay

## 2018-06-02 ENCOUNTER — Encounter: Payer: Self-pay | Admitting: Pulmonary Disease

## 2018-06-02 ENCOUNTER — Ambulatory Visit (INDEPENDENT_AMBULATORY_CARE_PROVIDER_SITE_OTHER): Payer: Medicare HMO | Admitting: Pulmonary Disease

## 2018-06-02 VITALS — BP 142/60 | Temp 98.3°F | Ht 61.0 in | Wt 229.0 lb

## 2018-06-02 DIAGNOSIS — J9811 Atelectasis: Secondary | ICD-10-CM | POA: Diagnosis not present

## 2018-06-02 DIAGNOSIS — J9611 Chronic respiratory failure with hypoxia: Secondary | ICD-10-CM

## 2018-06-02 DIAGNOSIS — G4733 Obstructive sleep apnea (adult) (pediatric): Secondary | ICD-10-CM | POA: Diagnosis not present

## 2018-06-02 DIAGNOSIS — J986 Disorders of diaphragm: Secondary | ICD-10-CM

## 2018-06-02 DIAGNOSIS — R0602 Shortness of breath: Secondary | ICD-10-CM | POA: Diagnosis not present

## 2018-06-02 MED ORDER — BUDESONIDE-FORMOTEROL FUMARATE 160-4.5 MCG/ACT IN AERO: 2.0000 | INHALATION_SPRAY | Freq: Two times a day (BID) | RESPIRATORY_TRACT | 0 refills | Status: DC

## 2018-06-02 NOTE — Patient Instructions (Addendum)
pulmonary function test ordered   Continue oxygen therapy as prescribed  >>>maintain oxygen saturations greater than 88 percent  >>>if unable to maintain oxygen saturations please contact the office  >>>do not smoke with oxygen  >>>can use nasal saline gel or nasal saline rinses to moisturize nose if oxygen causes dryness  Use continuous oxygen at night   Continue Symbicort 160 >>> 2 puffs in the morning right when you wake up, rinse out your mouth after use, 12 hours later 2 puffs, rinse after use >>> Take this daily, no matter what >>> This is not a rescue inhaler   Only use your albuterol as a rescue medication to be used if you can't catch your breath by resting or doing a relaxed purse lip breathing pattern.  - The less you use it, the better it will work when you need it. - Ok to use up to 2 puffs  every 4 hours if you must but call for immediate appointment if use goes up over your usual need - Don't leave home without it !!  (think of it like the spare tire for your car)   Return in about 6 weeks (around 07/14/2018), or if symptoms worsen or fail to improve, for Follow up for PFT, Follow up with [name] FNP-C.  Coronavirus (COVID-19) Are you at risk?  Are you at risk for the Coronavirus (COVID-19)?  To be considered HIGH RISK for Coronavirus (COVID-19), you have to meet the following criteria:  . Traveled to [place], [place], [place], [place] or [place]; or in the [place] to [place], [place], [place], or [place]; and have fever, cough, and shortness of breath within the last 2 weeks of travel OR . Been in close contact with a person diagnosed with COVID-19 within the last 2 weeks and have fever, cough, and shortness of breath . IF YOU DO NOT MEET THESE CRITERIA, YOU ARE CONSIDERED LOW RISK FOR COVID-19.  What to do if you are HIGH RISK for COVID-19?  Marland Kitchen If you are having a medical emergency, call 911. . Seek medical care right away. Before you go to a doctor's  office, urgent care or emergency department, call ahead and tell them about your recent travel, contact with someone diagnosed with COVID-19, and your symptoms. You should receive instructions from your physician's office regarding next steps of care.  . When you arrive at healthcare provider, tell the healthcare staff immediately you have returned from visiting [place], [place], [place], [place] or [place]; or traveled in the [place] to [place], [place], [place], or [place]; in the last two weeks or you have been in close contact with a person diagnosed with COVID-19 in the last 2 weeks.   . Tell the health care staff about your symptoms: fever, cough and shortness of breath. . After you have been seen by a medical provider, you will be either: o Tested for (COVID-19) and discharged home on quarantine except to seek medical care if symptoms worsen, and asked to  - Stay home and avoid contact with others until you get your results (4-5 days)  - Avoid travel on public transportation if possible (such as bus, train, or airplane) or o Sent to the Emergency Department by EMS for evaluation, COVID-19 testing, and possible admission depending on your condition and test results.  What to do if you are LOW RISK for COVID-19?  Reduce your risk of any infection by using the same precautions used for avoiding  the common cold or flu:  Marland Kitchen Wash your hands often with soap and warm water for at least 20 seconds.  If soap and water are not readily available, use an alcohol-based hand sanitizer with at least 60% alcohol.  . If coughing or sneezing, cover your mouth and nose by coughing or sneezing into the elbow areas of your shirt or coat, into a tissue or into your sleeve (not your hands). . Avoid shaking hands with others and consider head nods or verbal greetings only. . Avoid touching your eyes, nose, or mouth with unwashed hands.  . Avoid close contact with people who are sick. . Avoid places or  events with large numbers of people in one location, like concerts or sporting events. . Carefully consider travel plans you have or are making. . If you are planning any travel outside or inside the [place], visit the CDC's Travelers' Health webpage for the latest health notices. . If you have some symptoms but not all symptoms, continue to monitor at home and seek medical attention if your symptoms worsen. . If you are having a medical emergency, call 911.   [place] I / e-Visit: [email or web address]         [facility]: [facility]: [phone]                   [facility]: [phone]           It is flu season:   >>> Best ways to protect herself from the flu: Receive the yearly flu vaccine, practice good hand hygiene washing with soap and also using hand sanitizer when available, eat a nutritious meals, get adequate rest, hydrate appropriately   Please contact the office if your symptoms worsen or you have concerns that you are not improving.   Thank you for choosing [facility] for your healthcare, and for allowing [place] to partner with you on your healthcare journey. I am thankful to be able to provide care to you today.   [name] FNP-C     Home Oxygen Use, Adult When a medical condition keeps you from getting enough oxygen, your health care provider may instruct you to take extra oxygen at home. Your health care provider will let you know:  When to take oxygen.  For how long to take oxygen.  How quickly oxygen should be delivered (flow rate), in liters per minute (LPM or L/M). Home oxygen can be given through:  A mask.  A nasal cannula. This is a device or tube that goes in the nostrils.  A transtracheal catheter. This is a small, flexible tube placed in the trachea.  A tracheostomy. This is a  surgically made opening in the trachea. These devices are connected with tubing to an oxygen source, such as:  A tank. Tanks hold oxygen in gas form. They must be replaced when the oxygen is used up.  A liquid oxygen device. This holds oxygen in liquid form. It must be replaced when the oxygen is used up.  An oxygen concentrator machine. This filters oxygen in the room. It uses electricity, so you must have a backup cylinder of oxygen in case the power goes out. Supplies needed: To use oxygen, you will need:  A mask, nasal cannula, transtracheal catheter, or tracheostomy.  An oxygen tank, a liquid oxygen device, or an oxygen concentrator.  The tape that your health care provider recommends (  optional). If you use a transtracheal catheter and your prescribed flow rate is 1 LPM or greater, you will also need a humidifier. Risks and complications  Fire. This can happen if the oxygen is exposed to a heat source, flame, or spark.  Injury to skin. This can happen if liquid oxygen touches your skin.  Organ damage. This can happen if you get too little oxygen. How to use oxygen Your health care provider or a representative from your [place] will show you how to use your oxygen device. Follow her or his instructions. The instructions may look something like this: 1. Wash your hands. 2. If you use an oxygen concentrator, make sure it is plugged in. 3. Place one end of the tube into the port on the tank, device, or machine. 4. Place the mask over your nose and mouth. Or, place the nasal cannula and secure it with tape if instructed. If you use a tracheostomy or transtracheal catheter, connect it to the oxygen source as directed. 5. Make sure the liter-flow setting on the machine is at the level prescribed by your health care provider. 6. Turn on the machine or adjust the knob on the tank or device to the correct liter-flow setting. 7. When you are done, turn off and unplug the  machine, or turn the knob to OFF. How to clean and care for the oxygen supplies Nasal cannula  Clean it with a warm, wet cloth daily or as needed.  Wash it with a liquid soap once a week.  Rinse it thoroughly once or twice a week.  Replace it every 2-4 weeks.  If you have an infection, such as a cold or pneumonia, change the cannula when you get better. Mask  Replace it every 2-4 weeks.  If you have an infection, such as a cold or pneumonia, change the mask when you get better. Humidifier bottle  Wash the bottle between each refill: ? Wash it with soap and warm water. ? Rinse it thoroughly. ? Disinfect it and its top. ? Air-dry it.  Make sure it is dry before you refill it. Oxygen concentrator  Clean the air filter at least twice a week according to directions from your home medical equipment and service company.  Wipe down the cabinet every day. To do this: ? Unplug the unit. ? Wipe down the cabinet with a damp cloth. ? Dry the cabinet. Other equipment  Change any extra tubing every 1-3 months.  Follow instructions from your health care provider about taking care of any other equipment. Safety tips Fire safety tips   Keep your oxygen and oxygen supplies at least 5 ft away from sources of heat, flames, and sparks at all times.  Do not allow smoking near your oxygen. Put up "no smoking" signs in your home. Avoid smoking areas when in public.  Do not use materials that can burn (are flammable) while you use oxygen.  When you go to a restaurant with portable oxygen, ask to be seated in the nonsmoking section.  Keep a Data processing manager close by. Let your fire department know that you have oxygen in your home.  Test your home smoke detectors regularly. Traveling  Secure your oxygen tank in the vehicle so that it does not move around. Follow instructions from your medical device company about how to safely secure your tank.  Make sure you have enough oxygen for  the amount of time you will be away from home.  If you are  planning air travel, contact the airline to find out if they allow the use of an approved portable oxygen concentrator. You may also need documents from your health care provider and medical device company before you travel. General safety tips  If you use an oxygen cylinder, make sure it is in a stand or secured to an object that will not move (fixed object).  If you use liquid oxygen, make sure its container is kept upright.  If you use an oxygen concentrator: ? [employer] movement psychotherapist company. Make sure you are given priority service in the event that your power goes out. ? Avoid using extension cords, if possible. Follow these instructions at home:  Use oxygen only as told by your health care provider.  Do not use alcohol or other drugs that make you relax (sedating drugs) unless instructed. They can slow down your breathing rate and make it hard to get in enough oxygen.  Know how and when to order a refill of oxygen.  Always keep a spare tank of oxygen. Plan ahead for holidays when you may not be able to get a prescription filled.  Use water-based lubricants on your lips or nostrils. Do not use oil-based products like petroleum jelly.  To prevent skin irritation on your cheeks or behind your ears, tuck some gauze under the tubing. Contact a health care provider if:  You get headaches often.  You have shortness of breath.  You have a lasting cough.  You have anxiety.  You are sleepy all the time.  You develop an illness that affects your breathing.  You cannot exercise at your regular level.  You are restless.  You have difficult or irregular breathing, and it is getting worse.  You have a fever.  You have persistent redness under your nose. Get help right away if:  You are confused.  You have blue lips or fingernails.  You are struggling to breathe. Summary  Your health care provider or a  representative from your [place] will show you how to use your oxygen device. Follow her or his instructions.  If you use an oxygen concentrator, make sure it is plugged in.  Make sure the liter-flow setting on the machine is at the level prescribed by your health care provider.  Keep your oxygen and oxygen supplies at least 5 ft away from sources of heat, flames, and sparks at all times. This information is not intended to replace advice given to you by your health care provider. Make sure you discuss any questions you have with your health care provider. Document Released: 03/31/2003 Document Revised: 06/27/2017 Document Reviewed: 08/02/2015 Elsevier Interactive Patient Education  2019 [employer].

## 2018-06-02 NOTE — Progress Notes (Signed)
_0  ID: Becky Newton, female    DOB: Nov 03, 1933, 85 y.o.   MRN: [id number]  Chief Complaint  Patient presents with  . Acute Visit    Shortness of breath, same shortness of breath since September/2019    Referring provider: [name], NP  HPI:  85 year old female former smoker followed in our office for chronic respiratory failure with hypoxemia due to obesity, left-sided hemo-diaphragm paralysis, and significant atelectasis in both the right and left lungs  Smoker/ Smoking History: Former smoker.  Quit 1991.  10-pack-year smoking history. Maintenance: Symbicort 160 Pt of: Dr. Lake Bells  06/02/2018  - Visit   85 year old female former smoker followed in our office for dyspnea and chronic respiratory failure with hypoxemia likely due to obesity, left-sided hemi diaphragm paralysis and significant atelectasis in both right and left lungs.  Patient is followed by Dr. Lake Bells.  She was last seen in September/2019.  At that time patient declined pulmonary function testing.  Patient was started empirically on Symbicort 160.  Patient believes the Symbicort 160 has helped some.  Patient uses rescue inhaler every 4 hours.  Patient has been using 2 L continuous O2 during the day and has been to using 2 L via her POC at night.  Patient reports that her breathing is exact same since September/2019 office visit.  She continues to be maintained on Eliquis.  Patient denies fever, body aches, chills, wheezing, purulent sputum, cough, dizziness or lightheadedness.  Patient admits she does lead a sedentary lifestyle.  She reports that she has dyspnea when ambulating more than 10 to 15 feet.  Patient is presenting today because she is concerned with her breathing.  She is wondering if I recommendations from September/2019 still stand or other new interventions that could be available for her.  In September/2019 was recommended she completed pulmonary function test.  MMRC - Breathlessness Score 4 - I  am too breathless to leave the house or I am breathlessness when dressing    Tests:   07/2017 CT chest imnages reviewed; several areas of subsegmental atelectasis, most prominent in RML and left lower lobe, airway calcification noted, some mosaicism and emphysema  03/08/2017-echocardiogram- LV ejection fraction 60 to 65%, PAP pressure 88  FENO:  No results found for: NITRICOXIDE  PFT: No flowsheet data found.  Imaging: No results found.    Specialty Problems      Pulmonary Problems   Asthma, chronic    [employer] 3L/min      Paralysis, diaphragm    Left      Chronic respiratory failure (HCC)   OSA (obstructive sleep apnea)    AHI 12.5 when in [place] only using 2L/min via [place] due to fear of claustrophobia/CPAP      Epistaxis   COPD with acute exacerbation (HCC)   SOB (shortness of breath)      Allergies  Allergen Reactions  . Ivp Dye [Iodinated Diagnostic Agents] Nausea And Vomiting    Immunization History  Administered Date(s) Administered  . Influenza, High Dose Seasonal PF 09/20/2015, 09/30/2017  . Influenza,inj,Quad PF,6+ Mos 10/31/2016  . Influenza-Unspecified 10/03/2014  . Pneumococcal Conjugate-13 10/31/2016    Past Medical History:  Diagnosis Date  . A-fib ([place])   . Atrial fibrillation ([place])   . Chicken pox   . COPD (chronic obstructive pulmonary disease) ([place])   . Diabetes ([place])   . Fibrocystic breast determined by biopsy 1977  . GERD (gastroesophageal reflux disease)   . Glaucoma   . H/O hernia repair 2006  .  H/O left breast biopsy 1982  . Incisional hernia   . Lung disease    Paralyzed left hemidiaphragm  . S/P scar revision   . Uterine cancer ([place])     Tobacco History: Social History   Tobacco Use  Smoking Status Former Smoker  . Packs/day: 0.50  . Years: 20.00  . Pack years: 10.00  . Types: Cigarettes  . Last attempt to quit: 08/23/1989  . Years since quitting: 28.7  Smokeless Tobacco Never Used   Counseling given: Yes   Continue to not smoke  Outpatient Encounter Medications as of 06/02/2018  Medication Sig  . ACCU-CHEK FASTCLIX LANCETS MISC USE TO TEST BLOOD GLUCOSE TWICE DAILY  . ACCU-CHEK SMARTVIEW test strip USE TO TEST BLOOD GLUCOSE 1-2 TIMES DAILY  . albuterol (VENTOLIN HFA) 108 (90 Base) MCG/ACT inhaler INHALE 2 PUFFS INTO THE LUNGS EVERY 4 (FOUR) HOURS AS NEEDED FOR WHEEZING OR SHORTNESS OF BREATH.  Marland Kitchen AMBULATORY NON FORMULARY MEDICATION Accu-Chek FastClix Lancets Drum. Use to check blood sugar twice a day. Type 2 diabetes.  . AMBULATORY NON FORMULARY MEDICATION Accu-Chek SmartView glucose testing strips. Use to check blood sugar twice a day.  Type 2 diabetes.  Marland Kitchen apixaban (ELIQUIS) 5 MG TABS tablet Take 1 tablet (5 mg total) by mouth 2 (two) times daily.  . benzonatate (TESSALON) 200 MG capsule Take 1 capsule (200 mg total) by mouth 3 (three) times daily as needed for cough.  . Blood Glucose Monitoring Suppl (ACCU-CHEK NANO SMARTVIEW) w/Device KIT USE TO TEST BLOOD GLUCOSE TWICE DAILY  . budesonide-formoterol (SYMBICORT) 160-4.5 MCG/ACT inhaler Inhale 2 puffs, then rinse mouth, twice daily  . cholecalciferol (VITAMIN D) 1000 units tablet Take 1,000 Units by mouth at bedtime.   Marland Kitchen diltiazem (CARDIZEM CD) 240 MG 24 hr capsule Take 1 capsule (240 mg total) by mouth daily. (Patient taking differently: Take 240 mg by mouth at bedtime. )  . furosemide (LASIX) 20 MG tablet TAKE 1 TABLET TWICE DAILY  . Glucosamine-Chondroit-Vit C-Mn (GLUCOSAMINE CHONDR 500 COMPLEX PO) Take 1 tablet by mouth daily.   . hydrochlorothiazide (MICROZIDE) 12.5 MG capsule Take 1 capsule (12.5 mg total) by mouth daily.  Marland Kitchen ipratropium-albuterol (DUONEB) 0.5-2.5 (3) MG/3ML SOLN Take 3 mLs by nebulization every 4 (four) hours as needed.  . Lancets Misc. (ACCU-CHEK FASTCLIX LANCET) KIT USE TO TEST BLOOD GLUCOSE TWICE DAILY  . latanoprost (XALATAN) 0.005 % ophthalmic solution Place 1 drop into both eyes at bedtime.  . Lutein ([employer] LUTEIN) 40  MG CAPS Take 1 capsule by mouth daily.  . metFORMIN (GLUCOPHAGE) 500 MG tablet Take 1 tablet (500 mg total) by mouth 2 (two) times daily with a meal.  . metoprolol succinate (TOPROL-XL) 25 MG 24 hr tablet TAKE 1 TABLET (25 MG TOTAL) BY MOUTH DAILY.  . Multiple Vitamins-Minerals (PRESERVISION AREDS PO) Take 1 capsule by mouth 2 (two) times daily. [employer] Preservision  . omeprazole (PRILOSEC) 20 MG capsule Take 1 capsule (20 mg total) by mouth 2 (two) times daily before a meal.  . ondansetron (ZOFRAN) 4 MG tablet Take 1 tablet (4 mg total) by mouth every 6 (six) hours as needed for nausea or vomiting.  . OXYGEN Inhale 3 L into the lungs.   . potassium chloride (K-DUR) 10 MEQ tablet Take 1 tablet (10 mEq total) by mouth daily.  Marland Kitchen Respiratory Therapy Supplies (FLUTTER) DEVI Blow through 4 times per set, 3 sets per day  . [DISCONTINUED] budesonide-formoterol (SYMBICORT) 160-4.5 MCG/ACT inhaler Inhale 2 puffs into the lungs 2 (two)  times daily.  . budesonide-formoterol (SYMBICORT) 160-4.5 MCG/ACT inhaler Inhale 2 puffs into the lungs 2 (two) times daily.  Marland Kitchen diltiazem (CARDIZEM CD) 120 MG 24 hr capsule Take 1 capsule (120 mg total) by mouth daily as needed (as needed for Atrial Fibrillation).   No facility-administered encounter medications on file as of 06/02/2018.      Review of Systems  Review of Systems  Constitutional: Negative for chills, fatigue, fever and unexpected weight change.  HENT: Negative for congestion, ear pain, postnasal drip, sinus pressure and sinus pain.   Respiratory: Positive for shortness of breath. Negative for cough, chest tightness and wheezing.   Cardiovascular: Negative for chest pain and palpitations.  Gastrointestinal: Negative for diarrhea, nausea and vomiting.  Genitourinary: Negative for dysuria, frequency and urgency.  Musculoskeletal: Negative for arthralgias.  Skin: Negative for color change.  Allergic/Immunologic: Negative for environmental allergies and food  allergies.  Neurological: Negative for dizziness, light-headedness and headaches.  Psychiatric/Behavioral: Negative for dysphoric mood. The patient is not nervous/anxious.   All other systems reviewed and are negative.    Physical Exam  BP (!) 142/60 (BP Location: Right Wrist, Cuff Size: Normal)   Temp 98.3 F (36.8 C) (Oral)   Ht _0  (1.549 m)   Wt 229 lb (103.9 kg)   SpO2 90%   BMI 43.27 kg/m   Wt Readings from Last 5 Encounters:  06/02/18 229 lb (103.9 kg)  01/09/18 222 lb (100.7 kg)  11/11/17 224 lb (101.6 kg)  09/11/17 223 lb 6 oz (101.3 kg)  07/29/17 250 lb (113.4 kg)     Physical Exam  Constitutional: She is oriented to person, place, and time and well-developed, well-nourished, and in no distress. No distress.  Morbidly obese chronically ill female  HENT:  Head: Normocephalic and atraumatic.  Right Ear: Hearing, tympanic membrane, external ear and ear canal normal.  Left Ear: Hearing, tympanic membrane, external ear and ear canal normal.  Mouth/Throat: Uvula is midline and oropharynx is clear and moist. No oropharyngeal exudate.  Eyes: Pupils are equal, round, and reactive to light.  Neck: Normal range of motion. Neck supple.  Cardiovascular: Normal rate, regular rhythm and normal heart sounds.  Distant heart sounds  Pulmonary/Chest: Effort normal and breath sounds normal. No accessory muscle usage. No respiratory distress. She has no decreased breath sounds. She has no wheezes. She has no rhonchi. She has no rales.  Distant breath sounds  Abdominal: Soft. Bowel sounds are normal. There is no abdominal tenderness.  Musculoskeletal: Normal range of motion.        General: Edema (1-2+ lower extremity edema) present.  Lymphadenopathy:    She has no cervical adenopathy.  Neurological: She is alert and oriented to person, place, and time.  Skin: Skin is warm and dry. She is not diaphoretic. No erythema.  Psychiatric: Mood, memory, affect and judgment normal.   Nursing note and vitals reviewed.     Lab Results:  CBC    Component Value Date/Time   WBC 8.2 09/11/2017 1108   RBC 3.81 (L) 09/11/2017 1108   HGB 11.3 (L) 09/11/2017 1108   HCT 35.6 (L) 09/11/2017 1108   PLT 208.0 09/11/2017 1108   MCV 93.3 09/11/2017 1108   MCH 29.1 07/25/2017 1403   MCHC 31.8 09/11/2017 1108   RDW 16.7 (H) 09/11/2017 1108   LYMPHSABS 2.3 09/11/2017 1108   MONOABS 0.7 09/11/2017 1108   EOSABS 0.3 09/11/2017 1108   BASOSABS 0.1 09/11/2017 1108    BMET    Component  Value Date/Time   NA 141 09/11/2017 1108   NA 144 07/08/2014   K 4.0 09/11/2017 1108   CL 98 09/11/2017 1108   CO2 35 (H) 09/11/2017 1108   GLUCOSE 119 (H) 09/11/2017 1108   BUN 19 09/11/2017 1108   CREATININE 0.95 09/11/2017 1108   CREATININE 0.63 12/26/2015 1002   CALCIUM 9.7 09/11/2017 1108   CALCIUM 10.6 07/08/2014   GFRNONAA 48 (L) 07/25/2017 1403   GFRNONAA 85 12/26/2015 1002   GFRAA 56 (L) 07/25/2017 1403   GFRAA >89 12/26/2015 1002    BNP No results found for: BNP  ProBNP No results found for: PROBNP    Assessment & Plan:     SOB (shortness of breath) Assessment: Maintaining baseline shortness of breath on exertion since September/2019 office visit Patient has changed stance and is willing to consider pulmonary function testing Shortness of breath is likely multifactorial given the patient's obesity, hemidiaphragm paralysis, probable COPD, etc. Lungs clear to auscultation today  Plan: Pulmonary function test ordered to be completed Follow-up with our office in 6 weeks Continue Symbicort 160  OSA (obstructive sleep apnea) Assessment: Patient reporting that she received a diagnosis of obstructive sleep apnea in [place] Patient is not interested in CPAP therapy  Plan: Continue oxygen use as prescribed  Chronic respiratory failure ([place]) Plan: Continue oxygen use as prescribed Proceed forward with pulmonary function testing  Morbid obesity ([place])  Assessment: BMI 43.27 Leads sedentary lifestyle  Plan: Morbid obesity is likely contributing to patient's chronic shortness of breath Work to reduce BMI Improve daily physical activity      Lauraine Rinne, NP 06/02/2018   This appointment was 30 min long with over 50% of the time in direct face-to-face patient care, assessment, plan of care, and follow-up.

## 2018-06-02 NOTE — Assessment & Plan Note (Signed)
Assessment: Patient reporting that she received a diagnosis of obstructive sleep apnea in [place] Patient is not interested in CPAP therapy  Plan: Continue oxygen use as prescribed

## 2018-06-02 NOTE — Assessment & Plan Note (Signed)
Assessment: BMI 43.27 Leads sedentary lifestyle  Plan: Morbid obesity is likely contributing to patient's chronic shortness of breath Work to reduce BMI Improve daily physical activity

## 2018-06-02 NOTE — Assessment & Plan Note (Signed)
Assessment: Maintaining baseline shortness of breath on exertion since September/2019 office visit Patient has changed stance and is willing to consider pulmonary function testing Shortness of breath is likely multifactorial given the patient's obesity, hemidiaphragm paralysis, probable COPD, etc. Lungs clear to auscultation today  Plan: Pulmonary function test ordered to be completed Follow-up with our office in 6 weeks Continue Symbicort 160

## 2018-06-02 NOTE — Assessment & Plan Note (Signed)
Plan: Continue oxygen use as prescribed Proceed forward with pulmonary function testing

## 2018-06-04 NOTE — Telephone Encounter (Signed)
Refill sent, pt scheduled lab appt.

## 2018-06-04 NOTE — Progress Notes (Signed)
Reviewed, agree 

## 2018-06-04 NOTE — Telephone Encounter (Signed)
Pt refused to schedule lab appt - she states she will "just come in June". Pt was advised she did not have scheduled appt for June. She was argumentative and continued to refuse to schedule appt.   Misty - FYI. Rx had already been sent, but with no refills.

## 2018-06-05 NOTE — Telephone Encounter (Signed)
Ok for 90 days

## 2018-06-06 ENCOUNTER — Other Ambulatory Visit: Payer: Self-pay | Admitting: Adult Health

## 2018-06-10 ENCOUNTER — Encounter: Payer: Self-pay | Admitting: Adult Health

## 2018-06-10 NOTE — Telephone Encounter (Signed)
Sent to the pharmacy by e-scribe. 

## 2018-06-10 NOTE — Telephone Encounter (Signed)
Due for CPE in August 2020. OK to refill until then

## 2018-06-12 ENCOUNTER — Other Ambulatory Visit: Payer: Medicare HMO

## 2018-06-16 ENCOUNTER — Encounter: Payer: Self-pay | Admitting: Adult Health

## 2018-06-17 ENCOUNTER — Ambulatory Visit: Payer: Medicare HMO | Admitting: Adult Health

## 2018-06-17 ENCOUNTER — Other Ambulatory Visit (INDEPENDENT_AMBULATORY_CARE_PROVIDER_SITE_OTHER): Payer: Medicare HMO

## 2018-06-17 ENCOUNTER — Other Ambulatory Visit: Payer: Self-pay

## 2018-06-17 DIAGNOSIS — E119 Type 2 diabetes mellitus without complications: Secondary | ICD-10-CM

## 2018-06-17 LAB — MICROALBUMIN / CREATININE URINE RATIO
Creatinine,U: 84.4 mg/dL
Microalb Creat Ratio: 2.5 mg/g (ref 0.0–30.0)
Microalb, Ur: 2.1 mg/dL — ABNORMAL HIGH (ref 0.0–1.9)

## 2018-06-17 LAB — HEMOGLOBIN A1C: Hgb A1c MFr Bld: 6.7 % — ABNORMAL HIGH (ref 4.6–6.5)

## 2018-06-18 ENCOUNTER — Encounter: Payer: Self-pay | Admitting: Adult Health

## 2018-06-18 ENCOUNTER — Ambulatory Visit (INDEPENDENT_AMBULATORY_CARE_PROVIDER_SITE_OTHER): Payer: Medicare HMO | Admitting: Adult Health

## 2018-06-18 DIAGNOSIS — E119 Type 2 diabetes mellitus without complications: Secondary | ICD-10-CM

## 2018-06-18 NOTE — Progress Notes (Signed)
Virtual Visit via Telephone Note  I connected with Becky Newton on 06/18/18 at 11:00 AM EDT by telephone and verified that I am speaking with the correct person using two identifiers.   I discussed the limitations, risks, security and privacy concerns of performing an evaluation and management service by telephone and the availability of in person appointments. I also discussed with the patient that there may be a patient responsible charge related to this service. The patient expressed understanding and agreed to proceed.  Location patient: home Location provider: work or home office Participants present for the call: patient, provider Patient did not have a visit in the prior 7 days to address this/these issue(s).   History of Present Illness: 85 year old female who  has a past medical history of A-fib ([place]), Atrial fibrillation ([place]), Chicken pox, COPD (chronic obstructive pulmonary disease) ([place]), Diabetes ([place]), Fibrocystic breast determined by biopsy (1977), GERD (gastroesophageal reflux disease), Glaucoma, H/O hernia repair (2006), H/O left breast biopsy (1982), Incisional hernia, Lung disease, S/P scar revision, and Uterine cancer ([place]).  85 year old female who is being evaluated today for follow-up regarding diabetes.  She is currently maintained on metformin 500 mg twice daily.  Her last A1c 6 months ago was 6.2 days she reports that she has been checking her blood sugars at home and has had readings in the 130s to 140s, this is up slightly from what she is accustomed to.  This is likely from diet  Today her A1c is 6.7   Observations/Objective: Patient sounds cheerful and well on the phone. I do not appreciate any SOB. Speech and thought processing are grossly intact. Patient reported vitals:  Assessment and Plan: 1. Type 2 diabetes mellitus without complication, without long-term current use of insulin ([place]) -Once he is up slightly from 6.2-6.7.  We will continue her on her  current dose of metformin.  Advised to make better dietary choices.  We will follow-up in 3 months at her CPE  Follow Up Instructions:  I did not refer this patient for an OV in the next 24 hours for this/these issue(s).  I discussed the assessment and treatment plan with the patient. The patient was provided an opportunity to ask questions and all were answered. The patient agreed with the plan and demonstrated an understanding of the instructions.   The patient was advised to call back or seek an in-person evaluation if the symptoms worsen or if the condition fails to improve as anticipated.  I provided 25 minutes of non-face-to-face time during this encounter.   [name], NP

## 2018-06-20 ENCOUNTER — Other Ambulatory Visit: Payer: Self-pay | Admitting: Physician Assistant

## 2018-06-26 ENCOUNTER — Telehealth: Payer: Self-pay

## 2018-06-26 NOTE — Telephone Encounter (Signed)
betty jo called me this about this appt, i can get thru and am tied of waiting so thought i would go this route.  [name] ----- Message ----- From: Nurse Claria Dice  Sent: 06/26/2018 1:57 PM EDT  To: Becky Newton  Subject: RE: Non-Urgent Medical Question  Hello,   It looks like you are still scheduled for a video virtual visit with Dr. [name] on 6/16 @ 10:40am. Do you need a different day or time?  ----- Message -----   From: Becky Newton   Sent: 06/26/2018 1:41 PM EDT    To: [name], MD  Subject: Non-Urgent Medical Question  [name] u called me this am to change my 6/16 1040 am visual with [name]. please let me know what is avail. thanks Becky Newton [birth year]  Called and informed pt that I do not know who called or who betty joe is. Hopefully she will cb I do not see any messages

## 2018-06-26 NOTE — Telephone Encounter (Signed)
Found out [name] is in the pulmonary ofice, pt notified she will call

## 2018-06-26 NOTE — Telephone Encounter (Signed)
Per appt note, Sharl Ma had forwarded a note today about rescheduling appt.  Message routed to Burman Nieves to follow up

## 2018-06-27 ENCOUNTER — Telehealth: Payer: Self-pay | Admitting: Pulmonary Disease

## 2018-06-27 ENCOUNTER — Other Ambulatory Visit: Payer: Self-pay | Admitting: Pulmonary Disease

## 2018-06-27 NOTE — Telephone Encounter (Signed)
Called and spoke to patient. Scheduled her COVID screening. Nothing further needed at this time.

## 2018-06-27 NOTE — Telephone Encounter (Signed)
Returned call to patient and explained we could not proceed with PFT without COVID testing. Explained this is a [employer] in effort to keep everyone safe. Pt is agreeable to proceed with testing.   PFT/OV 07/04/18

## 2018-07-01 ENCOUNTER — Other Ambulatory Visit (HOSPITAL_COMMUNITY)
Admission: RE | Admit: 2018-07-01 | Discharge: 2018-07-01 | Disposition: A | Discharge status: Home / Self Care | Payer: Medicare HMO | Source: Ambulatory Visit | Attending: Pulmonary Disease | Admitting: Pulmonary Disease

## 2018-07-01 ENCOUNTER — Other Ambulatory Visit: Payer: Self-pay

## 2018-07-01 DIAGNOSIS — Z01812 Encounter for preprocedural laboratory examination: Secondary | ICD-10-CM | POA: Diagnosis not present

## 2018-07-01 DIAGNOSIS — Z1159 Encounter for screening for other viral diseases: Secondary | ICD-10-CM | POA: Insufficient documentation

## 2018-07-02 LAB — NOVEL CORONAVIRUS, NAA (HOSP ORDER, SEND-OUT TO REF LAB; TAT 18-24 HRS): SARS-CoV-2, NAA: NOT DETECTED

## 2018-07-02 NOTE — Progress Notes (Signed)
Sars COV 2 test is negative. This is good news.   [name] FNP

## 2018-07-02 NOTE — Progress Notes (Signed)
LMOMTCB x 1 

## 2018-07-03 ENCOUNTER — Telehealth: Payer: Self-pay | Admitting: Cardiology

## 2018-07-03 NOTE — Progress Notes (Signed)
_0  ID: Becky Newton, female    DOB: May 09, 1933, 85 y.o.   MRN: [id number]  Chief Complaint  Patient presents with  . Follow-up    PFT    Referring provider: [name], NP  HPI:  85 year old female former smoker followed in our office for chronic respiratory failure with hypoxemia due to obesity, left-sided hemo-diaphragm paralysis, and significant atelectasis in both the right and left lungs, OSA (not interested in CPAP therapy)  PMH: A-fib ([place]), Atrial fibrillation ([place]), Chicken pox, COPD (chronic obstructive pulmonary disease) ([place]), Diabetes ([place]), Fibrocystic breast determined by biopsy (1977), GERD (gastroesophageal reflux disease), Glaucoma, H/O hernia repair (2006), H/O left breast biopsy (1982), Incisional hernia, Lung disease, S/P scar revision, and Uterine cancer ([place]). Smoker/ Smoking History: Former smoker.  Quit 1991.  10-pack-year smoking history. Maintenance: Symbicort 160 Pt of: Dr. Lake Bells.   07/04/2018  - Visit   85 year old female former smoker followed in our office for shortness of breath, emphysema, and chronic respiratory failure.  Patient presenting to our office today after attempted to complete a pulmonary function test.  Patient was unable to complete this pulmonary function test.  Patient is also reporting she is having difficulty affording her Symbicort inhaler.  She is wondering if we have patient assistance programs for that.  Patient's weight is up today.  She admits she has not been taking her diuretic as prescribed.  She is only been taking her furosemide 20 mg daily instead of 20 mg twice daily.  Patient reporting lower extremity swelling.  That she does not currently weigh herself at home.  Patient spouse is with her today and reports that they have been having premade meals from [employer].  They are unsure what the sodium content is of these meals.  They have a planned tele-visit with cardiology next week.    Tests:   07/2017 CT chest  imnages reviewed; several areas of subsegmental atelectasis, most prominent in RML and left lower lobe, airway calcification noted, some mosaicism and emphysema  03/08/2017-echocardiogram- LV ejection fraction 60 to 65%, PAP pressure 88  FENO:  No results found for: NITRICOXIDE  PFT: No flowsheet data found.  Imaging: No results found.    Specialty Problems      Pulmonary Problems   Asthma, chronic    [employer] 3L/min      Paralysis, diaphragm    Left      Chronic respiratory failure (HCC)   OSA (obstructive sleep apnea)    AHI 12.5 when in [place] only using 2L/min via [place] due to fear of claustrophobia/CPAP      Epistaxis   COPD with acute exacerbation ([place])   SOB (shortness of breath)   Pulmonary emphysema ([place])    07/2017 CT chest imnages reviewed; several areas of subsegmental atelectasis, most prominent in RML and left lower lobe, airway calcification noted, some mosaicism and emphysema         Allergies  Allergen Reactions  . Ivp Dye [Iodinated Diagnostic Agents] Nausea And Vomiting    Immunization History  Administered Date(s) Administered  . Influenza, High Dose Seasonal PF 09/20/2015, 09/30/2017  . Influenza,inj,Quad PF,6+ Mos 10/31/2016  . Influenza-Unspecified 10/03/2014  . Pneumococcal Conjugate-13 10/31/2016    Past Medical History:  Diagnosis Date  . A-fib ([place])   . Atrial fibrillation ([place])   . Chicken pox   . COPD (chronic obstructive pulmonary disease) ([place])   . Diabetes ([place])   . Fibrocystic breast determined by biopsy 1977  . GERD (gastroesophageal reflux disease)   .  Glaucoma   . H/O hernia repair 2006  . H/O left breast biopsy 1982  . Incisional hernia   . Lung disease    Paralyzed left hemidiaphragm  . S/P scar revision   . Uterine cancer ([place])     Tobacco History: Social History   Tobacco Use  Smoking Status Former Smoker  . Packs/day: 0.50  . Years: 36.00  . Pack years: 18.00  . Types: Cigarettes  . Start date: 31   . Quit date: 08/23/1989  . Years since quitting: 28.8  Smokeless Tobacco Never Used   Counseling given: Yes  Continue to not smoke  Outpatient Encounter Medications as of 07/04/2018  Medication Sig  . ACCU-CHEK FASTCLIX LANCETS MISC USE TO TEST BLOOD GLUCOSE TWICE DAILY  . ACCU-CHEK SMARTVIEW test strip USE TO TEST BLOOD GLUCOSE 1-2 TIMES DAILY  . albuterol (VENTOLIN HFA) 108 (90 Base) MCG/ACT inhaler INHALE 2 PUFFS INTO THE LUNGS EVERY 4 (FOUR) HOURS AS NEEDED FOR WHEEZING OR SHORTNESS OF BREATH.  Marland Kitchen AMBULATORY NON FORMULARY MEDICATION Accu-Chek FastClix Lancets Drum. Use to check blood sugar twice a day. Type 2 diabetes.  . AMBULATORY NON FORMULARY MEDICATION Accu-Chek SmartView glucose testing strips. Use to check blood sugar twice a day.  Type 2 diabetes.  Marland Kitchen apixaban (ELIQUIS) 5 MG TABS tablet Take 1 tablet (5 mg total) by mouth 2 (two) times daily.  . benzonatate (TESSALON) 200 MG capsule Take 1 capsule (200 mg total) by mouth 3 (three) times daily as needed for cough.  . Blood Glucose Monitoring Suppl (ACCU-CHEK NANO SMARTVIEW) w/Device KIT USE TO TEST BLOOD GLUCOSE TWICE DAILY  . budesonide-formoterol (SYMBICORT) 160-4.5 MCG/ACT inhaler Inhale 2 puffs, then rinse mouth, twice daily  . budesonide-formoterol (SYMBICORT) 160-4.5 MCG/ACT inhaler Inhale 2 puffs into the lungs 2 (two) times daily.  . cholecalciferol (VITAMIN D) 1000 units tablet Take 1,000 Units by mouth at bedtime.   Marland Kitchen diltiazem (CARDIZEM CD) 240 MG 24 hr capsule TAKE 1 CAPSULE (240 MG TOTAL) BY MOUTH DAILY.  . furosemide (LASIX) 20 MG tablet TAKE 1 TABLET TWICE DAILY  . Glucosamine-Chondroit-Vit C-Mn (GLUCOSAMINE CHONDR 500 COMPLEX PO) Take 1 tablet by mouth daily.   . hydrochlorothiazide (MICROZIDE) 12.5 MG capsule Take 1 capsule (12.5 mg total) by mouth daily.  Marland Kitchen ipratropium-albuterol (DUONEB) 0.5-2.5 (3) MG/3ML SOLN Take 3 mLs by nebulization every 4 (four) hours as needed.  . Lancets Misc. (ACCU-CHEK FASTCLIX LANCET)  KIT USE TO TEST BLOOD GLUCOSE TWICE DAILY  . latanoprost (XALATAN) 0.005 % ophthalmic solution Place 1 drop into both eyes at bedtime.  . Lutein ([employer] LUTEIN) 40 MG CAPS Take 1 capsule by mouth daily.  . metFORMIN (GLUCOPHAGE) 500 MG tablet TAKE 1 TABLET TWICE DAILY WITH MEALS  . metoprolol succinate (TOPROL-XL) 25 MG 24 hr tablet TAKE 1 TABLET (25 MG TOTAL) BY MOUTH DAILY.  . Multiple Vitamins-Minerals (PRESERVISION AREDS PO) Take 1 capsule by mouth 2 (two) times daily. Areds Preservision  . omeprazole (PRILOSEC) 20 MG capsule TAKE 1 CAPSULE (20 MG TOTAL) BY MOUTH TWO TIMES DAILY BEFORE A MEAL.  Marland Kitchen ondansetron (ZOFRAN) 4 MG tablet Take 1 tablet (4 mg total) by mouth every 6 (six) hours as needed for nausea or vomiting.  . OXYGEN Inhale 3 L into the lungs.   . potassium chloride (K-DUR) 10 MEQ tablet Take 1 tablet (10 mEq total) by mouth daily.  Marland Kitchen Respiratory Therapy Supplies (FLUTTER) DEVI Blow through 4 times per set, 3 sets per day  . budesonide-formoterol (SYMBICORT) 160-4.5 MCG/ACT  inhaler Inhale 2 puffs into the lungs 2 (two) times a day.  . diltiazem (CARDIZEM CD) 120 MG 24 hr capsule Take 1 capsule (120 mg total) by mouth daily as needed (as needed for Atrial Fibrillation).   No facility-administered encounter medications on file as of 07/04/2018.      Review of Systems  Review of Systems  Constitutional: Positive for fatigue. Negative for chills, fever and unexpected weight change.  HENT: Negative for postnasal drip, sinus pressure and sinus pain.   Respiratory: Positive for shortness of breath. Negative for cough, chest tightness and wheezing.   Cardiovascular: Positive for leg swelling. Negative for chest pain and palpitations.  Gastrointestinal: Negative for diarrhea, nausea and vomiting.  Genitourinary: Negative for dysuria, frequency and urgency.  Musculoskeletal: Negative for arthralgias.  Skin: Negative for color change.  Allergic/Immunologic: Negative for environmental  allergies and food allergies.  Neurological: Negative for dizziness, light-headedness and headaches.  Psychiatric/Behavioral: Negative for dysphoric mood. The patient is not nervous/anxious.   All other systems reviewed and are negative.    Physical Exam  BP 130/86 (BP Location: Right Wrist, Patient Position: Sitting, Cuff Size: Normal)   Pulse 79   Ht _0  (1.549 m)   Wt 233 lb (105.7 kg)   SpO2 96%   BMI 44.02 kg/m   Wt Readings from Last 5 Encounters:  07/04/18 233 lb (105.7 kg)  06/02/18 229 lb (103.9 kg)  01/09/18 222 lb (100.7 kg)  11/11/17 224 lb (101.6 kg)  09/11/17 223 lb 6 oz (101.3 kg)     Physical Exam  Constitutional: She is oriented to person, place, and time and well-developed, well-nourished, and in no distress. No distress.  Obese chronically ill female  HENT:  Head: Normocephalic and atraumatic.  Right Ear: Hearing, tympanic membrane, external ear and ear canal normal.  Left Ear: Hearing, tympanic membrane, external ear and ear canal normal.  Nose: Nose normal.  Mouth/Throat: Uvula is midline and oropharynx is clear and moist. No oropharyngeal exudate.  Eyes: Pupils are equal, round, and reactive to light.  Neck: Normal range of motion. Neck supple.  Cardiovascular: Normal rate, regular rhythm and normal heart sounds.  Pulmonary/Chest: Effort normal. No accessory muscle usage. No respiratory distress. She has no decreased breath sounds. She has no wheezes. She has no rhonchi. She has no rales.  Diminished breath sounds throughout exam  Abdominal: Soft. Bowel sounds are normal. She exhibits no distension. There is no abdominal tenderness.  Obese abdomen  Musculoskeletal: Normal range of motion.        General: Edema (3+ lower extremity edema bilaterally) present.  Lymphadenopathy:    She has no cervical adenopathy.  Neurological: She is alert and oriented to person, place, and time.  In motorized wheelchair  Skin: Skin is warm and dry. She is not  diaphoretic. No erythema.  Psychiatric: Mood, memory, affect and judgment normal.  Nursing note and vitals reviewed.     Lab Results:  CBC    Component Value Date/Time   WBC 8.2 09/11/2017 1108   RBC 3.81 (L) 09/11/2017 1108   HGB 11.3 (L) 09/11/2017 1108   HCT 35.6 (L) 09/11/2017 1108   PLT 208.0 09/11/2017 1108   MCV 93.3 09/11/2017 1108   MCH 29.1 07/25/2017 1403   MCHC 31.8 09/11/2017 1108   RDW 16.7 (H) 09/11/2017 1108   LYMPHSABS 2.3 09/11/2017 1108   MONOABS 0.7 09/11/2017 1108   EOSABS 0.3 09/11/2017 1108   BASOSABS 0.1 09/11/2017 1108    BMET  Component Value Date/Time   NA 141 09/11/2017 1108   NA 144 07/08/2014   K 4.0 09/11/2017 1108   CL 98 09/11/2017 1108   CO2 35 (H) 09/11/2017 1108   GLUCOSE 119 (H) 09/11/2017 1108   BUN 19 09/11/2017 1108   CREATININE 0.95 09/11/2017 1108   CREATININE 0.63 12/26/2015 1002   CALCIUM 9.7 09/11/2017 1108   CALCIUM 10.6 07/08/2014   GFRNONAA 48 (L) 07/25/2017 1403   GFRNONAA 85 12/26/2015 1002   GFRAA 56 (L) 07/25/2017 1403   GFRAA >89 12/26/2015 1002    BNP No results found for: BNP  ProBNP No results found for: PROBNP    Assessment & Plan:   Pulmonary hypertension due to lung disease [facility]) Assessment: February/2018 echocardiogram shows a PA P pressure of 88 Known emphysema on 2019 CT Unable to complete pulmonary function test today, likely restrictive lung disease Left-sided hemo-diaphragm paralysis, significant atelectasis in both right and left lungs History of obstructive sleep apnea, noncompliant with CPAP due to claustrophobia 3+ lower extremity edema today Weight is elevated Patient nonadherent to diuretic  Plan: Start taking 40 mg Lasix daily until cardiology follow-up next week Start weighing yourself daily Try to adhere to a low-sodium diet Elevate legs when able Wear compression stockings when able Continue oxygen therapy as prescribed Follow-up with our office in 2 months  OSA  (obstructive sleep apnea) Assessment: Patient received diagnosis of obstructive sleep apnea in [place], patient does not want to use CPAP therapy  Plan: Continue oxygen therapy as prescribed  Pulmonary emphysema ([place]) Plan: Continue Symbicort 160 [facility] Continue oxygen therapy as prescribed  Morbid obesity ([place]) Assessment: BMI 44.02  Plan: Try to increase physical activity as able Work on diet to decrease BMI  Medication management Plan: [facility]  Lower extremity edema Assessment: 3+ lower extremity edema Noncompliant with already prescribed diuretic  Plan: Start 40 mg Lasix daily in the morning Lab work today Follow-up with cardiology next week to develop a different diuretic treatment plan    Return in about 2 months (around 09/03/2018), or if symptoms worsen or fail to improve, for Follow up with [name] FNP-C, Follow up with Dr. Lake Bells.   Lauraine Rinne, NP 07/04/2018   This appointment was 44 minutes long with over 50% of the time in direct face-to-face patient care, assessment, plan of care, and follow-up.

## 2018-07-03 NOTE — Telephone Encounter (Signed)
Video-email- [email or web address]/ home phone/ my chart active/ pre reg completed

## 2018-07-04 ENCOUNTER — Encounter: Payer: Self-pay | Admitting: Pulmonary Disease

## 2018-07-04 ENCOUNTER — Other Ambulatory Visit: Payer: Self-pay

## 2018-07-04 ENCOUNTER — Ambulatory Visit: Payer: Medicare HMO | Admitting: Pulmonary Disease

## 2018-07-04 ENCOUNTER — Ambulatory Visit: Payer: Medicare Other | Admitting: Pulmonary Disease

## 2018-07-04 VITALS — BP 130/86 | HR 79 | Ht 61.0 in | Wt 233.0 lb

## 2018-07-04 DIAGNOSIS — G4733 Obstructive sleep apnea (adult) (pediatric): Secondary | ICD-10-CM

## 2018-07-04 DIAGNOSIS — Z79899 Other long term (current) drug therapy: Secondary | ICD-10-CM | POA: Diagnosis not present

## 2018-07-04 DIAGNOSIS — J439 Emphysema, unspecified: Secondary | ICD-10-CM | POA: Diagnosis not present

## 2018-07-04 DIAGNOSIS — R600 Localized edema: Secondary | ICD-10-CM

## 2018-07-04 DIAGNOSIS — J9811 Atelectasis: Secondary | ICD-10-CM

## 2018-07-04 DIAGNOSIS — I2723 Pulmonary hypertension due to lung diseases and hypoxia: Secondary | ICD-10-CM

## 2018-07-04 DIAGNOSIS — R0602 Shortness of breath: Secondary | ICD-10-CM

## 2018-07-04 DIAGNOSIS — J986 Disorders of diaphragm: Secondary | ICD-10-CM

## 2018-07-04 DIAGNOSIS — J9611 Chronic respiratory failure with hypoxia: Secondary | ICD-10-CM

## 2018-07-04 MED ORDER — BUDESONIDE-FORMOTEROL FUMARATE 160-4.5 MCG/ACT IN AERO: 2.0000 | INHALATION_SPRAY | Freq: Two times a day (BID) | RESPIRATORY_TRACT | 0 refills | Status: DC

## 2018-07-04 NOTE — Assessment & Plan Note (Signed)
Plan: [facility]

## 2018-07-04 NOTE — Patient Instructions (Addendum)
Labwork daily   Increase lasix to 72m daily (2 tablets) until follow up with Cardiology on 07/08/2018 >>> take in the am  >>> take with food   Follow up with Cardiology next week   Continue oxygen therapy as prescribed  >>>maintain oxygen saturations greater than 88 percent  >>>if unable to maintain oxygen saturations please contact the office  >>>do not smoke with oxygen  >>>can use nasal saline gel or nasal saline rinses to moisturize nose if oxygen causes dryness  Use continuous oxygen at night   Continue Symbicort 160 >>> 2 puffs in the morning right when you wake up, rinse out your mouth after use, 12 hours later 2 puffs, rinse after use >>> Take this daily, no matter what >>> This is not a rescue inhaler   Only use your albuterol as a rescue medication to be used if you can't catch your breath by resting or doing a relaxed purse lip breathing pattern.  - The less you use it, the better it will work when you need it. - Ok to use up to 2 puffs every 4 hours if you must but call for immediate appointment if use goes up over your usual need - Don't leave home without it !! (think of it like the spare tire for your car)      Patient Education for Fluid Management  Do the following things EVERY DAY:   1. Weigh yourself EVERY morning after you go to the bathroom but before you eat or drink anything. Write this number down in a weight log/diary.    2. Take your medicines as prescribed. If you have concerns about your medications, please call [place] you stop taking them.    3. Eat low salt foods-Limit salt (sodium) to 2000 mg per day. This will help prevent your body from holding onto fluid. Read food labels as many processed foods have a lot of sodium, especially canned goods and prepackaged meats. If you would like some assistance choosing low sodium foods, we would be happy to set you up with a nutritionist.   4. Stay as active as you can everyday. Staying active will  give you more energy and make your muscles stronger. Start with 5 minutes at a time and work your way up to 30 minutes a day. Break up your activities--do some in the morning and some in the afternoon. Start with 3 days per week and work your way up to 5 days as you can.  If you have chest pain, feel short of breath, dizzy, or lightheaded, STOP. If you don't feel better after a short rest, call 911. If you do feel better, call the office to let u[place]now you have symptoms with exercise.   5. Limit all fluids for the day to less than 2 liters. Fluid includes all drinks, coffee, juice, ice chips, soup, jello, and all other liquids.      Return in about 2 months (around 09/03/2018), or if symptoms worsen or fail to improve, for Follow up with BWyn QuakerFNP-C, Follow up with Dr. MLake Bells   Coronavirus (COVID-19) Are you at risk?  Are you at risk for the Coronavirus (COVID-19)?  To be considered HIGH RISK for Coronavirus (COVID-19), you have to meet the following criteria:  . Traveled to [place] or in the [place] and have fever, cough, and shortness of breath within the last 2 weeks  of travel OR . Been in close contact with a person diagnosed with COVID-19 within the last 2 weeks and have fever, cough, and shortness of breath . IF YOU DO NOT MEET THESE CRITERIA, YOU ARE CONSIDERED LOW RISK FOR COVID-19.  What to do if you are HIGH RISK for COVID-19?  Marland Kitchen If you are having a medical emergency, call 911. . Seek medical care right away. Before you go to a doctor's office, urgent care or emergency department, call ahead and tell them about your recent travel, contact with someone diagnosed with COVID-19, and your symptoms. You should receive instructions from your physician's office regarding next steps of care.  . When you arrive at healthcare provider, tell the healthcare staff immediately you have returned  from visiting [place], [place], [place], [place] or [place]; or traveled in the [place] to [place], [place], [place], or [place]; in the last two weeks or you have been in close contact with a person diagnosed with COVID-19 in the last 2 weeks.   . Tell the health care staff about your symptoms: fever, cough and shortness of breath. . After you have been seen by a medical provider, you will be either: o Tested for (COVID-19) and discharged home on quarantine except to seek medical care if symptoms worsen, and asked to  - Stay home and avoid contact with others until you get your results (4-5 days)  - Avoid travel on public transportation if possible (such as bus, train, or airplane) or o Sent to the Emergency Department by EMS for evaluation, COVID-19 testing, and possible admission depending on your condition and test results.  What to do if you are LOW RISK for COVID-19?  Reduce your risk of any infection by using the same precautions used for avoiding the common cold or flu:  Marland Kitchen Wash your hands often with soap and warm water for at least 20 seconds.  If soap and water are not readily available, use an alcohol-based hand sanitizer with at least 60% alcohol.  . If coughing or sneezing, cover your mouth and nose by coughing or sneezing into the elbow areas of your shirt or coat, into a tissue or into your sleeve (not your hands). . Avoid shaking hands with others and consider head nods or verbal greetings only. . Avoid touching your eyes, nose, or mouth with unwashed hands.  . Avoid close contact with people who are sick. . Avoid places or events with large numbers of people in one location, like concerts or sporting events. . Carefully consider travel plans you have or are making. . If you are planning any travel outside or inside the [place], visit the CDC's Travelers' Health webpage for the latest health notices. . If you have some symptoms but not all symptoms, continue to monitor at  home and seek medical attention if your symptoms worsen. . If you are having a medical emergency, call 911.   [place] / e-Visit: [email or web address]         [facility]: [facility]: [phone]                   [facility]: [phone]           It is flu season:   >>> Best ways to protect herself from the flu: Receive the yearly flu vaccine, practice good hand hygiene washing with soap and also using hand sanitizer  when available, eat a nutritious meals, get adequate rest, hydrate appropriately   Please contact the office if your symptoms worsen or you have concerns that you are not improving.   Thank you for choosing [facility] for your healthcare, and for allowing [place] to partner with you on your healthcare journey. I am thankful to be able to provide care to you today.   [name] FNP-C

## 2018-07-04 NOTE — Progress Notes (Signed)
PFT was attempted but not successful. Started Pre patient was not able to inhale and blow out 6 sec. Test aborted.

## 2018-07-04 NOTE — Assessment & Plan Note (Signed)
Assessment: 3+ lower extremity edema Noncompliant with already prescribed diuretic  Plan: Start 40 mg Lasix daily in the morning Lab work today Follow-up with cardiology next week to develop a different diuretic treatment plan

## 2018-07-04 NOTE — Assessment & Plan Note (Signed)
Plan: Continue Symbicort 160 [facility] Continue oxygen therapy as prescribed

## 2018-07-04 NOTE — Assessment & Plan Note (Signed)
Assessment: BMI 44.02  Plan: Try to increase physical activity as able Work on diet to decrease BMI

## 2018-07-04 NOTE — Assessment & Plan Note (Addendum)
Assessment: February/2018 echocardiogram shows a PA P pressure of 88 Known emphysema on 2019 CT Unable to complete pulmonary function test today, likely restrictive lung disease Left-sided hemo-diaphragm paralysis, significant atelectasis in both right and left lungs History of obstructive sleep apnea, noncompliant with CPAP due to claustrophobia 3+ lower extremity edema today Weight is elevated Patient nonadherent to diuretic  Plan: Start taking 40 mg Lasix daily until cardiology follow-up next week Start weighing yourself daily Try to adhere to a low-sodium diet Elevate legs when able Wear compression stockings when able Continue oxygen therapy as prescribed Follow-up with our office in 2 months

## 2018-07-04 NOTE — Assessment & Plan Note (Signed)
Assessment: Patient received diagnosis of obstructive sleep apnea in [place], patient does not want to use CPAP therapy  Plan: Continue oxygen therapy as prescribed

## 2018-07-04 NOTE — Addendum Note (Signed)
Addended by: Suzzanne Cloud E on: 07/04/2018 04:25 PM   Modules accepted: Orders

## 2018-07-05 LAB — COMPREHENSIVE METABOLIC PANEL
AG Ratio: 1.3 (calc) (ref 1.0–2.5)
ALT: 8 U/L (ref 6–29)
AST: 10 U/L (ref 10–35)
Albumin: 3.4 g/dL — ABNORMAL LOW (ref 3.6–5.1)
Alkaline phosphatase (APISO): 73 U/L (ref 37–153)
BUN: 14 mg/dL (ref 7–25)
CO2: 30 mmol/L (ref 20–32)
Calcium: 9.7 mg/dL (ref 8.6–10.4)
Chloride: 101 mmol/L (ref 98–110)
Creat: 0.86 mg/dL (ref 0.60–0.88)
Globulin: 2.6 g/dL (calc) (ref 1.9–3.7)
Glucose, Bld: 141 mg/dL — ABNORMAL HIGH (ref 65–99)
Potassium: 4 mmol/L (ref 3.5–5.3)
Sodium: 143 mmol/L (ref 135–146)
Total Bilirubin: 0.4 mg/dL (ref 0.2–1.2)
Total Protein: 6 g/dL — ABNORMAL LOW (ref 6.1–8.1)

## 2018-07-05 LAB — CBC WITH DIFFERENTIAL/PLATELET
Absolute Monocytes: 817 cells/uL (ref 200–950)
Basophils Absolute: 48 cells/uL (ref 0–200)
Basophils Relative: 0.5 %
Eosinophils Absolute: 171 cells/uL (ref 15–500)
Eosinophils Relative: 1.8 %
HCT: 29.7 % — ABNORMAL LOW (ref 35.0–45.0)
Hemoglobin: 8.9 g/dL — ABNORMAL LOW (ref 11.7–15.5)
Lymphs Abs: 1957 cells/uL (ref 850–3900)
MCH: 25.6 pg — ABNORMAL LOW (ref 27.0–33.0)
MCHC: 30 g/dL — ABNORMAL LOW (ref 32.0–36.0)
MCV: 85.3 fL (ref 80.0–100.0)
MPV: 13.7 fL — ABNORMAL HIGH (ref 7.5–12.5)
Monocytes Relative: 8.6 %
Neutro Abs: 6508 cells/uL (ref 1500–7800)
Neutrophils Relative %: 68.5 %
Platelets: 243 10*3/uL (ref 140–400)
RBC: 3.48 10*6/uL — ABNORMAL LOW (ref 3.80–5.10)
RDW: 16.2 % — ABNORMAL HIGH (ref 11.0–15.0)
Total Lymphocyte: 20.6 %
WBC: 9.5 10*3/uL (ref 3.8–10.8)

## 2018-07-05 NOTE — Progress Notes (Signed)
Reviewed, agree 

## 2018-07-07 ENCOUNTER — Telehealth: Payer: Self-pay | Admitting: Primary Care

## 2018-07-07 NOTE — Telephone Encounter (Signed)
Agreed.  Thank you.  B

## 2018-07-07 NOTE — Progress Notes (Signed)
Virtual Visit via Video Note   This visit type was conducted due to national recommendations for restrictions regarding the COVID-19 Pandemic (e.g. social distancing) in an effort to limit this patient's exposure and mitigate transmission in our community.  Due to her co-morbid illnesses, this patient is at least at moderate risk for complications without adequate follow up.  This format is felt to be most appropriate for this patient at this time.  All issues noted in this document were discussed and addressed.  A limited physical exam was performed with this format.  Please refer to the patient's chart for her consent to telehealth for [facility]   Date:  07/08/2018   ID:  Becky Newton, DOB [birth year], MRN [id number]  Patient Location: Home Provider Location: Home  PCP:  [name], NP  Cardiologist:  [name], MD  Evaluation Performed:  Follow-Up Visit  Chief Complaint:  Atrial fibrillation  History of Present Illness:    Follow-up atrial fibrillation. Last echocardiogram February 2019 showed ejection fraction 60 to 65% and mild left atrial enlargement. Holter monitor March 2019 showed sinus rhythm with occasional PAC and PVC. Nuclear study April 2019 showed ejection fraction 73% and no ischemia or infarction. Patient also with history of chronic COPD and paralyzed left hemidiaphragm.  Recently seen by pulmonary and Lasix increased for worsening lower extremity edema.  It was noted patient declined CPAP. Since last seenpatient continues to have dyspnea on exertion from COPD.  She states her pedal edema is much improved on higher dose Lasix.  She has not had chest pain or syncope.  She continues to have occasional nosebleeds.  The patient does not have symptoms concerning for COVID-19 infection (fever, chills, cough, or new shortness of breath).    Past Medical History:  Diagnosis Date  . A-fib ([place])   . Atrial fibrillation ([place])   . Chicken pox   . COPD  (chronic obstructive pulmonary disease) ([place])   . Diabetes ([place])   . Fibrocystic breast determined by biopsy 1977  . GERD (gastroesophageal reflux disease)   . Glaucoma   . H/O hernia repair 2006  . H/O left breast biopsy 1982  . Incisional hernia   . Lung disease    Paralyzed left hemidiaphragm  . S/P scar revision   . Uterine cancer [facility])    Past Surgical History:  Procedure Laterality Date  . APPENDECTOMY  1966  . BREAST BIOPSY    . CATARACT EXTRACTION  2004,2005  . CHOLECYSTECTOMY  1975  . HERNIA REPAIR  2006  . HIATAL HERNIA REPAIR  1966  . NISSEN FUNDOPLICATION    . TOTAL ABDOMINAL HYSTERECTOMY W/ BILATERAL SALPINGOOPHORECTOMY  1974   hx of cancer      Current Meds  Medication Sig  . ACCU-CHEK FASTCLIX LANCETS MISC USE TO TEST BLOOD GLUCOSE TWICE DAILY  . ACCU-CHEK SMARTVIEW test strip USE TO TEST BLOOD GLUCOSE 1-2 TIMES DAILY  . albuterol (VENTOLIN HFA) 108 (90 Base) MCG/ACT inhaler INHALE 2 PUFFS INTO THE LUNGS EVERY 4 (FOUR) HOURS AS NEEDED FOR WHEEZING OR SHORTNESS OF BREATH.  Marland Kitchen AMBULATORY NON FORMULARY MEDICATION Accu-Chek FastClix Lancets Drum. Use to check blood sugar twice a day. Type 2 diabetes.  . AMBULATORY NON FORMULARY MEDICATION Accu-Chek SmartView glucose testing strips. Use to check blood sugar twice a day.  Type 2 diabetes.  Marland Kitchen apixaban (ELIQUIS) 5 MG TABS tablet Take 1 tablet (5 mg total) by mouth 2 (two) times daily.  . benzonatate (TESSALON) 200 MG capsule Take  1 capsule (200 mg total) by mouth 3 (three) times daily as needed for cough.  . Blood Glucose Monitoring Suppl (ACCU-CHEK NANO SMARTVIEW) w/Device KIT USE TO TEST BLOOD GLUCOSE TWICE DAILY  . budesonide-formoterol (SYMBICORT) 160-4.5 MCG/ACT inhaler Inhale 2 puffs into the lungs 2 (two) times a day.  . cholecalciferol (VITAMIN D) 1000 units tablet Take 1,000 Units by mouth at bedtime.   Marland Kitchen diltiazem (CARDIZEM CD) 120 MG 24 hr capsule Take 1 capsule (120 mg total) by mouth daily as needed (as needed  for Atrial Fibrillation).  Marland Kitchen diltiazem (CARDIZEM CD) 240 MG 24 hr capsule TAKE 1 CAPSULE (240 MG TOTAL) BY MOUTH DAILY.  . furosemide (LASIX) 20 MG tablet TAKE 1 TABLET TWICE DAILY  . Glucosamine-Chondroit-Vit C-Mn (GLUCOSAMINE CHONDR 500 COMPLEX PO) Take 1 tablet by mouth daily.   . hydrochlorothiazide (MICROZIDE) 12.5 MG capsule Take 1 capsule (12.5 mg total) by mouth daily.  Marland Kitchen ipratropium-albuterol (DUONEB) 0.5-2.5 (3) MG/3ML SOLN Take 3 mLs by nebulization every 4 (four) hours as needed.  . Lancets Misc. (ACCU-CHEK FASTCLIX LANCET) KIT USE TO TEST BLOOD GLUCOSE TWICE DAILY  . latanoprost (XALATAN) 0.005 % ophthalmic solution Place 1 drop into both eyes at bedtime.  . Lutein ([employer]S LUTEIN) 40 MG CAPS Take 1 capsule by mouth daily.  . metFORMIN (GLUCOPHAGE) 500 MG tablet TAKE 1 TABLET TWICE DAILY WITH MEALS  . metoprolol succinate (TOPROL-XL) 25 MG 24 hr tablet TAKE 1 TABLET (25 MG TOTAL) BY MOUTH DAILY.  . Multiple Vitamins-Minerals (PRESERVISION AREDS PO) Take 1 capsule by mouth 2 (two) times daily. Areds Preservision  . omeprazole (PRILOSEC) 20 MG capsule TAKE 1 CAPSULE (20 MG TOTAL) BY MOUTH TWO TIMES DAILY BEFORE A MEAL.  Marland Kitchen ondansetron (ZOFRAN) 4 MG tablet Take 1 tablet (4 mg total) by mouth every 6 (six) hours as needed for nausea or vomiting.  . OXYGEN Inhale 3 L into the lungs.   . potassium chloride (K-DUR) 10 MEQ tablet Take 1 tablet (10 mEq total) by mouth daily.  Marland Kitchen Respiratory Therapy Supplies (FLUTTER) DEVI Blow through 4 times per set, 3 sets per day  . [DISCONTINUED] budesonide-formoterol (SYMBICORT) 160-4.5 MCG/ACT inhaler Inhale 2 puffs, then rinse mouth, twice daily  . [DISCONTINUED] budesonide-formoterol (SYMBICORT) 160-4.5 MCG/ACT inhaler Inhale 2 puffs into the lungs 2 (two) times daily.     Allergies:   Ivp dye [iodinated diagnostic agents]   Social History   Tobacco Use  . Smoking status: Former Smoker    Packs/day: 0.50    Years: 36.00    Pack years: 18.00     Types: Cigarettes    Start date: 68    Quit date: 08/23/1989    Years since quitting: 28.8  . Smokeless tobacco: Never Used  Substance Use Topics  . Alcohol use: No    Alcohol/week: 0.0 standard drinks  . Drug use: No     Family Hx: The patient's family history includes Alcohol abuse in her brother; Arthritis in her mother; Breast cancer in her mother; Heart attack in her mother; Heart disease in her mother; Stroke in her mother.  ROS:   Please see the history of present illness.    No fevers, chills or productive cough. All other systems reviewed and are negative.   Recent Labs: 09/11/2017: TSH 2.35 07/04/2018: ALT 8; BUN 14; Creat 0.86; Hemoglobin 8.9; Platelets 243; Potassium 4.0; Sodium 143   Recent Lipid Panel Lab Results  Component Value Date/Time   CHOL 173 09/11/2017 11:08 AM   TRIG 165.0 (  H) 09/11/2017 11:08 AM   HDL 43.30 09/11/2017 11:08 AM   CHOLHDL 4 09/11/2017 11:08 AM   LDLCALC 97 09/11/2017 11:08 AM    Wt Readings from Last 3 Encounters:  07/08/18 227 lb (103 kg)  07/04/18 233 lb (105.7 kg)  06/02/18 229 lb (103.9 kg)     Objective:    Vital Signs:  BP (!) 128/53   Pulse 71   Ht _0  (1.549 m)   Wt 227 lb (103 kg)   BMI 42.89 kg/m    VITAL SIGNS:  reviewed  No acute distress Chronically ill-appearing Answers questions appropriately Normal affect Remainder physical examination not performed (telehealth visit; coronavirus pandemic)  ASSESSMENT & PLAN:    1. Paroxysmal atrial fibrillation-patient has had no recurrence of atrial fibrillation based on history.  We will continue with Cardizem and metoprolol for rate control if atrial fibrillation recurs.  Continue apixaban.  She has had some difficulties with nosebleeds previously.  We again discussed referral to [facility] for watchman placement but she declines at this point and continues to want only procedures performed in [place].  We will consider in the future if agreeable.  If she  becomes severely anemic from nosebleeds would need to discontinue apixaban despite higher risk of CVA. 2. Chronic home O2 dependent COPD/pulmonary hypertension-followed by pulmonary 3. History of paralyzed diaphragm 4. History of chest pain-no recurrent symptoms.  Previous nuclear study negative.  No plans for further ischemia evaluation at this point. 5. Chronic diastolic congestive heart failure-likely component of RV failure/pulmonary hypertension.  Patient's pedal edema has improved following increased dose of Lasix.  We will continue 40 mg daily.  COVID-19 Education: The importance of social distancing was discussed today.  Time:   Today, I have spent 17 minutes with the patient with telehealth technology discussing the above problems.     Medication Adjustments/Labs and Tests Ordered: Current medicines are reviewed at length with the patient today.  Concerns regarding medicines are outlined above.   Tests Ordered: No orders of the defined types were placed in this encounter.   Medication Changes: No orders of the defined types were placed in this encounter.   Follow Up:  Virtual Visit or In Person in 6 month(s)  Signed, [name], MD  07/08/2018 10:13 AM    [place]

## 2018-07-07 NOTE — Telephone Encounter (Signed)
Please let patient know lab work looked ok from [place]. Kidney function was fine, continue lasix. She is slightly anemic, advise she follow up with PCP.

## 2018-07-07 NOTE — Telephone Encounter (Signed)
Contacted patient re: lab results. Advised of Derl Barrow, NP review and recommendations.  Patient acknowledged understanding of results and will contact PCP regarding mild anemia.  Patient had no further questions and this encounter was closed.

## 2018-07-08 ENCOUNTER — Ambulatory Visit: Payer: Medicare HMO | Admitting: Pulmonary Disease

## 2018-07-08 ENCOUNTER — Telehealth (INDEPENDENT_AMBULATORY_CARE_PROVIDER_SITE_OTHER): Payer: Medicare HMO | Admitting: Cardiology

## 2018-07-08 ENCOUNTER — Encounter: Payer: Self-pay | Admitting: Adult Health

## 2018-07-08 VITALS — BP 128/53 | HR 71 | Ht 61.0 in | Wt 227.0 lb

## 2018-07-08 DIAGNOSIS — I5032 Chronic diastolic (congestive) heart failure: Secondary | ICD-10-CM

## 2018-07-08 DIAGNOSIS — I480 Paroxysmal atrial fibrillation: Secondary | ICD-10-CM | POA: Diagnosis not present

## 2018-07-08 DIAGNOSIS — R072 Precordial pain: Secondary | ICD-10-CM

## 2018-07-08 NOTE — Patient Instructions (Signed)

## 2018-07-09 ENCOUNTER — Other Ambulatory Visit: Payer: Self-pay

## 2018-07-09 NOTE — Patient Outreach (Signed)
[place] [facility]) Care Management  07/09/2018  Becky Newton 08/28/1933 [id number]   Referral Date: 07/07/2018 Referral Source: MD referral Referral Reason: Help with medications.   Outreach Attempt: Spoke with patient and spouse.  She is able to verify HIPAA.  She states that she has problems affording her Symbicort and Ventolin.  She states that her Ventolin costs about $38.00 and she and her husband are not sure of the cost of the Symbicort.     Social: She and her husband live with their son.  She states that her husband helps her with all aspects of care.     Conditions: Patient admits to COPD and is oxygen dependent.  She also admits to A. Fib., HTN, HF, paralysis of her diaphragm, and diabetes. She states that she manages well with her health and her doctor was pleased with her blood sugars.  Patient declines further education and support of diseases at this time.    Medications: Patient takes medications as prescribed but has trouble affording her COPD medications.   Appointments: Patient recently had an appointment with her PCP.      Advanced Directives: Patient does have an advanced directive.     Consent: Discussed with patient [facility] services and how we could support patient. She is agreeable to pharmacy only at this time.     Plan: RN CM will refer patient to pharmacy for medications assistance.     Jone Baseman, RN, MSN [facility] Care Management Care Management Coordinator Direct Line [phone] Toll Free: [phone]  Fax: [phone]

## 2018-07-10 ENCOUNTER — Encounter: Payer: Self-pay | Admitting: Adult Health

## 2018-07-10 ENCOUNTER — Other Ambulatory Visit: Payer: Self-pay | Admitting: Adult Health

## 2018-07-10 DIAGNOSIS — D509 Iron deficiency anemia, unspecified: Secondary | ICD-10-CM

## 2018-07-14 ENCOUNTER — Other Ambulatory Visit (INDEPENDENT_AMBULATORY_CARE_PROVIDER_SITE_OTHER): Payer: Medicare HMO

## 2018-07-14 ENCOUNTER — Other Ambulatory Visit: Payer: Self-pay

## 2018-07-14 DIAGNOSIS — D509 Iron deficiency anemia, unspecified: Secondary | ICD-10-CM

## 2018-07-14 LAB — CBC WITH DIFFERENTIAL/PLATELET
Basophils Absolute: 0.1 10*3/uL (ref 0.0–0.1)
Basophils Relative: 0.7 % (ref 0.0–3.0)
Eosinophils Absolute: 0.2 10*3/uL (ref 0.0–0.7)
Eosinophils Relative: 2.3 % (ref 0.0–5.0)
HCT: 29.3 % — ABNORMAL LOW (ref 36.0–46.0)
Hemoglobin: 9 g/dL — ABNORMAL LOW (ref 12.0–15.0)
Lymphocytes Relative: 23.6 % (ref 12.0–46.0)
Lymphs Abs: 2.4 10*3/uL (ref 0.7–4.0)
MCHC: 30.6 g/dL (ref 30.0–36.0)
MCV: 83.2 fl (ref 78.0–100.0)
Monocytes Absolute: 0.9 10*3/uL (ref 0.1–1.0)
Monocytes Relative: 9.3 % (ref 3.0–12.0)
Neutro Abs: 6.4 10*3/uL (ref 1.4–7.7)
Neutrophils Relative %: 64.1 % (ref 43.0–77.0)
Platelets: 270 10*3/uL (ref 150.0–400.0)
RBC: 3.53 Mil/uL — ABNORMAL LOW (ref 3.87–5.11)
RDW: 18.1 % — ABNORMAL HIGH (ref 11.5–15.5)
WBC: 10 10*3/uL (ref 4.0–10.5)

## 2018-07-15 LAB — IRON, TOTAL/TOTAL IRON BINDING CAP
%SAT: 6 % (calc) — ABNORMAL LOW (ref 16–45)
Iron: 23 ug/dL — ABNORMAL LOW (ref 45–160)
TIBC: 383 mcg/dL (calc) (ref 250–450)

## 2018-07-16 ENCOUNTER — Other Ambulatory Visit: Payer: Self-pay | Admitting: Adult Health

## 2018-07-16 DIAGNOSIS — D509 Iron deficiency anemia, unspecified: Secondary | ICD-10-CM

## 2018-07-17 ENCOUNTER — Ambulatory Visit: Payer: Self-pay | Admitting: Pharmacist

## 2018-07-18 ENCOUNTER — Other Ambulatory Visit: Payer: Self-pay | Admitting: Pharmacist

## 2018-07-18 ENCOUNTER — Ambulatory Visit: Payer: Self-pay | Admitting: Pharmacist

## 2018-07-18 NOTE — Patient Outreach (Signed)
[facility]) Care Management  [place]  07/18/2018  Becky Newton January 08, 1934 [id number]   Reason for referral: Medication Assistance  Referral source: [facility] RN Current insurance: [employer]  Outreach:  Unsuccessful telephone call attempt #1 to patient.   HIPAA compliant voicemail left requesting a return call  Plan:  -I will make another outreach attempt to patient within 3-4 business days.    Regina Eck, PharmD, [place]  [phone]

## 2018-07-22 ENCOUNTER — Ambulatory Visit: Payer: Self-pay | Admitting: Pharmacist

## 2018-07-24 ENCOUNTER — Ambulatory Visit: Payer: Self-pay | Admitting: Pharmacist

## 2018-07-28 ENCOUNTER — Other Ambulatory Visit: Payer: Self-pay | Admitting: Pharmacist

## 2018-07-29 NOTE — Patient Outreach (Signed)
[facility]) Care Management  [place]   07/29/2018  Becky Newton [birth year] [id number]  Reason for referral: Medication Assistance with inhalers  Referral source: [facility] RN Current insurance: [employer]  PMHx includes but not limited to:  CHF, Afib, HTN, asthma, COPD  Outreach:  Successful telephone call with Becky Newton.  HIPAA identifiers verified.  Patient is agreeable to review medications telephonically.  She states she is compliant with all of her medications.  She states she cannot afford her inhalers (symbicort/albuterol HFA).  She has not met the out of pocket minimum of $600 to apply for Symbicort through [facility].  She is willing to try [facility] and Proventil through [employer].  Patient denies SOB and edema.  She remains compliant with Lasix although she complains about nocturia.  Counseled patient to take Lasix early in the morning.   Lab Results  Component Value Date   CREATININE 0.86 07/04/2018   CREATININE 0.95 09/11/2017   CREATININE 1.06 (H) 07/25/2017   Lipid Panel     Component Value Date/Time   CHOL 173 09/11/2017 1108   TRIG 165.0 (H) 09/11/2017 1108   HDL 43.30 09/11/2017 1108   CHOLHDL 4 09/11/2017 1108   VLDL 33.0 09/11/2017 1108   LDLCALC 97 09/11/2017 1108    BP Readings from Last 3 Encounters:  07/08/18 (!) 128/53  07/04/18 130/86  06/02/18 (!) 142/60   Medications Reviewed Today    Reviewed by Lavera Guise, [place] (Pharmacist) on 07/28/18 at 1442  Med List Status: <None>  Medication Order Taking? Sig Documenting Provider Last Dose Status Informant  ACCU-CHEK FASTCLIX LANCETS MISC [id number]  USE TO TEST BLOOD GLUCOSE TWICE DAILY Nafziger, Tommi Rumps, NP  Active   ACCU-CHEK SMARTVIEW test strip [id number]  USE TO TEST BLOOD GLUCOSE 1-2 TIMES DAILY Nafziger, Tommi Rumps, NP  Active Self  albuterol (VENTOLIN HFA) 108 (90 Base) MCG/ACT inhaler [id number]  INHALE 2 PUFFS INTO THE LUNGS EVERY 4 (FOUR) HOURS AS NEEDED FOR WHEEZING OR SHORTNESS OF BREATH.  Juanito Doom, MD  Active   AMBULATORY Baruch Gouty MEDICATION [id number]  Accu-Chek FastClix Lancets Drum. Use to check blood sugar twice a day. Type 2 diabetes. Marcial Pacas, DO  Active Self  AMBULATORY NON FORMULARY MEDICATION [id number]  Accu-Chek SmartView glucose testing strips. Use to check blood sugar twice a day.  Type 2 diabetes. Donella Stade, PA-C  Active Self  apixaban (ELIQUIS) 5 MG TABS tablet [id number]  Take 1 tablet (5 mg total) by mouth 2 (two) times daily. Lelon Perla, MD  Active   benzonatate (TESSALON) 200 MG capsule [id number]  Take 1 capsule (200 mg total) by mouth 3 (three) times daily as needed for cough. Nafziger, Tommi Rumps, NP  Active   Blood Glucose Monitoring Suppl (ACCU-CHEK NANO SMARTVIEW) w/Device Drucie Opitz [id number]  USE TO TEST BLOOD GLUCOSE TWICE DAILY Nafziger, Tommi Rumps, NP  Active   budesonide-formoterol [facility]) 160-4.5 MCG/ACT inhaler [id number]  Inhale 2 puffs into the lungs 2 (two) times a day. Lauraine Rinne, NP  Active   cholecalciferol (VITAMIN D) 1000 units tablet [id number]  Take 1,000 Units by mouth at bedtime.  [provider]  Active Self  diltiazem (CARDIZEM CD) 120 MG 24 hr capsule [id number]  Take 1 capsule (120 mg total) by mouth daily as needed (as needed for Atrial Fibrillation). Lelon Perla, MD  Expired 07/08/18 2359   diltiazem (CARDIZEM CD) 240 MG 24 hr capsule [id number]  TAKE 1 CAPSULE (240 MG TOTAL) BY MOUTH DAILY. Almyra Deforest,  PA  Active   furosemide (LASIX) 20 MG tablet [id number]  TAKE 1 TABLET TWICE DAILY Nafziger, Tommi Rumps, NP  Active   Glucosamine-Chondroit-Vit C-Mn (GLUCOSAMINE CHONDR 500 COMPLEX PO) [id number]  Take 1 tablet by mouth daily.  [provider]  Active Self  hydrochlorothiazide (MICROZIDE) 12.5 MG capsule [id number]  Take 1 capsule (12.5 mg total) by mouth daily. Nafziger, Tommi Rumps, NP  Active   ipratropium-albuterol (DUONEB) 0.5-2.5 (3) MG/3ML SOLN [id number]  Take 3 mLs by nebulization every 4 (four) hours as  needed. Deneise Lever, MD  Active   Lancets Misc. (ACCU-CHEK FASTCLIX LANCET) Drucie Opitz [id number]  USE TO TEST BLOOD GLUCOSE TWICE DAILY Nafziger, Tommi Rumps, NP  Active   latanoprost (XALATAN) 0.005 % ophthalmic solution [id number]  Place 1 drop into both eyes at bedtime. [provider]  Active Self  Lutein ([employer]S LUTEIN) 40 MG CAPS [id number]  Take 1 capsule by mouth daily. [provider]  Active Self  metFORMIN (GLUCOPHAGE) 500 MG tablet [id number]  TAKE 1 TABLET TWICE DAILY WITH MEALS Nafziger, Tommi Rumps, NP  Active   metoprolol succinate (TOPROL-XL) 25 MG 24 hr tablet [id number]  TAKE 1 TABLET (25 MG TOTAL) BY MOUTH DAILY. Nafziger, Tommi Rumps, NP  Active   Multiple Vitamins-Minerals (PRESERVISION AREDS PO) [id number]  Take 1 capsule by mouth 2 (two) times daily. Areds Preservision [provider]  Active Self           Med Note Eulas Post, CRYSTAL D   Fri Mar 08, 2017  6:22 AM)    omeprazole (PRILOSEC) 20 MG capsule [id number]  TAKE 1 CAPSULE (20 MG TOTAL) BY MOUTH TWO TIMES DAILY BEFORE A MEAL. Nafziger, Tommi Rumps, NP  Active   ondansetron (ZOFRAN) 4 MG tablet [id number]  Take 1 tablet (4 mg total) by mouth every 6 (six) hours as needed for nausea or vomiting. [name], NP  Active Self  OXYGEN [id number]  Inhale 3 L into the lungs.  [provider]  Active Self           Med Note Eulas Post, CRYSTAL D   Fri Mar 08, 2017  6:22 AM)    potassium chloride (K-DUR) 10 MEQ tablet [id number]  Take 1 tablet (10 mEq total) by mouth daily. [name], NP  Active   Respiratory Therapy Supplies (FLUTTER) DEVI [id number]  Blow through 4 times per set, 3 sets per day Deneise Lever, MD  Active         Medication Assistance Findings:   Patient Assistance Programs: Ruthe Mannan & Proventil made by [employer] o Income requirement met: Yes o Out-of-pocket prescription expenditure met:   Not Applicable - Patient has met application requirements to apply for this patient assistance program.     Plan: . I  will route patient assistance letter to [place] technician who will coordinate patient assistance program application process for medications listed above.  [facility] pharmacy technician will assist with obtaining all required documents from both patient and provider(s) and submit application(s) once completed.     Regina Eck, PharmD, [place]  [phone]

## 2018-07-30 ENCOUNTER — Other Ambulatory Visit: Payer: Self-pay | Admitting: Pharmacy Technician

## 2018-07-30 NOTE — Patient Outreach (Signed)
[place]) Care Management  07/30/2018  Becky Newton 1933-11-14 [id number]                          Medication Assistance Referral  Referral From: [facility] RPh Jenne Pane  Medication/Company: Ruthe Mannan and Proventil HFA / [employer] Patient application portion:  Education officer, museum portion: Interoffice Mailed to Dr. Lake Bells   Follow up:  Will follow up with patient in 7-10 business days to confirm application(s) have been received.  Maud Deed Chana Bode Sheldon Certified Pharmacy Technician [place] Management Direct Dial:[phone]

## 2018-08-12 ENCOUNTER — Other Ambulatory Visit: Payer: Self-pay | Admitting: Pharmacist

## 2018-08-12 ENCOUNTER — Telehealth: Payer: Self-pay | Admitting: Pulmonary Disease

## 2018-08-12 MED ORDER — MOMETASONE FURO-FORMOTEROL FUM 200-5 MCG/ACT IN AERO: 2.0000 | INHALATION_SPRAY | Freq: Two times a day (BID) | RESPIRATORY_TRACT | 3 refills | Status: AC

## 2018-08-12 NOTE — Telephone Encounter (Signed)
Yes okay for patient to be switched to [facility] 200.  Please offer the patient Symbicort 160 samples (1 or 2 based off of stock) while she works on the paperwork to receive the Dulera 200.  Please contact Almyra Free from [facility] and figure out what we need in order to get the patient started on Dulera 200 if she can receive that free.  Please also route this information to patient's primary care provider to update the patient's inhaler will be switching so we can improve patient's access to this medication.[name], FNP

## 2018-08-12 NOTE — Telephone Encounter (Signed)
Called Almyra Free and stated to her that Aaron Edelman was fine with pt switching to [facility] from Symbicort. Asked her if there was an application that needed to be filled out and she said that she had already sent it over via interoffice mail as the application has to be sent back via mail and cannot be faxed in.  Checked with Cherina to see if she had the application and she said that she did but it had BQ's info on [employer] in regards to this to see if it was okay for [place] to mark out BQ's info and put Brian's on it and she stated that that would be fine.before I had a chance to tell Cherina this, she had already placed it in the shred. I have reprinted the second page of the application which is the provider's part that needs to be filled out.  Will send in interoffice mail to [place] with [facility] once application and Rx have been signed by Aaron Edelman. Routing to myself to follow up on.

## 2018-08-12 NOTE — Telephone Encounter (Signed)
Called and spoke with Molino from [facility]. Per Almyra Free, pt is unable to afford Symbicort and she was wanting to know if we would be okay to switch pt to [facility] as she would be able to receive that inhaler for free. Due to [place] seeing pt last, routing this to him. Aaron Edelman, please advise if you are okay if we switch pt from Symbicort to [facility].

## 2018-08-13 NOTE — Telephone Encounter (Signed)
Patient assistance forms and Rx have been signed by Aaron Edelman and has been placed in an interoffice envelope with Julie's name on it.     Called and spoke with pt letting her know that [facility] has been working with [place] getting her switched from Symbicort to [facility] due to [place] being at no cost for her. Stated to her once she receives the [facility], she would stop taking Symbicort. Pt verbalized understanding. Nothing further needed.

## 2018-08-14 NOTE — Patient Outreach (Signed)
[place] [facility]) Care Management [place]  08/14/2018  Becky Newton May 05, 1933 [id number]  Reason for referral: medication assistance   Successful care coordination call to [facility] to discuss inhaler switch.  [name], NP okay to switch patient from Symbicort to [facility] due to cost.  She will also remain on albuterol rescue inhaler.  Dulera and Albuterol (Proventil) to be obtained via [employer] patient assistance program.  Patient has not met 3% out of pocket required by [employer] for [employer].  Will continue to follow.  PLAN: -I will follow with patient next month   Regina Eck, PharmD, [place]  [phone]

## 2018-08-15 ENCOUNTER — Telehealth: Payer: Self-pay | Admitting: Pulmonary Disease

## 2018-08-15 NOTE — Telephone Encounter (Signed)
Error

## 2018-08-18 ENCOUNTER — Other Ambulatory Visit: Payer: Self-pay | Admitting: Pharmacy Technician

## 2018-08-18 NOTE — Patient Outreach (Signed)
[place] [facility]) Care Management  08/18/2018  Becky Newton 1933/12/24 [id number]   Received provider portion(s) of patient assistance application for [facility] and Proventil HFA. Prepared to mail completed application and required documents into [employer].  Will follow up with company in 10-14 business days to check status of application.  [name] Certified Pharmacy Technician [place] Management Direct [phone]

## 2018-09-01 ENCOUNTER — Ambulatory Visit: Payer: Self-pay | Admitting: Pharmacist

## 2018-09-01 ENCOUNTER — Other Ambulatory Visit: Payer: Self-pay | Admitting: Pharmacist

## 2018-09-01 ENCOUNTER — Encounter: Payer: Self-pay | Admitting: Adult Health

## 2018-09-02 ENCOUNTER — Encounter: Payer: Self-pay | Admitting: Adult Health

## 2018-09-02 DIAGNOSIS — E119 Type 2 diabetes mellitus without complications: Secondary | ICD-10-CM

## 2018-09-02 NOTE — Progress Notes (Signed)
_0  ID: Becky Newton, female    DOB: 14-Sep-1933, 85 y.o.   MRN: [id number]  Chief Complaint  Patient presents with  . Follow-up    Resp Failure follow up     Referring provider: [name], NP  HPI:  85 year old female former smoker followed in our office for chronic respiratory failure with hypoxemia due to obesity, left-sided hemo-diaphragm paralysis, and significant atelectasis in both the right and left lungs, OSA (not interested in CPAP therapy)  PMH: A-fib ([place]), Atrial fibrillation ([place]), Chicken pox, COPD (chronic obstructive pulmonary disease) ([place]), Diabetes ([place]), Fibrocystic breast determined by biopsy (1977), GERD (gastroesophageal reflux disease), Glaucoma, H/O hernia repair (2006), H/O left breast biopsy (1982), Incisional hernia, Lung disease, S/P scar revision, and Uterine cancer ([place]). Smoker/ Smoking History: Former smoker.  Quit 1991.  10-pack-year smoking history. Maintenance: Symbicort 160 Pt of: Dr. Lake Bells.   09/03/2018  - Visit   85 year old female former smoker followed in our office for chronic respiratory failure.  She is a patient of Dr. Lake Bells.  She is presenting today for a follow-up visit.  Patient reports that she is been requiring 4-1/2 L of home oxygen and still with physical activity her oxygen levels will drop to the mid to low 80s.  Patient is not very active.  She is walking with a walker.  She also utilizes a lift chair at home.  She presents today on a motorized wheelchair with 3 L pulsed via her POC.  She reports her oxygen levels are always stable until she has to start physically exerting herself.  Patient has attempted to complete pulmonary function testing in our office before but was unsuccessful.  Patient is in the process [facility] inhalers from Symbicort 160 to [facility] 200.  Patient is also working to get a rescue inhaler through this process as well.  Of note patient reports that now  she is maintained on Lasix twice daily managed by cardiology.  Her weight is down 6 pounds today from her last office visit.  Is down 12 pounds since the previous office visit before that.   Tests:   07/2017 CT chest -  several areas of subsegmental atelectasis, most prominent in RML and left lower lobe, airway calcification noted, some mosaicism and emphysema  03/08/2017-echocardiogram- LV ejection fraction 60 to 65%, PAP pressure 88   FENO:  No results found for: NITRICOXIDE  PFT: No flowsheet data found.  Imaging: No results found.    Specialty Problems      Pulmonary Problems   Paralysis, diaphragm    Left      Chronic respiratory failure (HCC)   OSA (obstructive sleep apnea)    AHI 12.5 when in [place] only using 2L/min via [place] due to fear of claustrophobia/CPAP      Epistaxis   COPD with acute exacerbation (HCC)   SOB (shortness of breath)   Pulmonary emphysema ([place])    07/2017 CT chest imnages reviewed; several areas of subsegmental atelectasis, most prominent in RML and left lower lobe, airway calcification noted, some mosaicism and emphysema         Allergies  Allergen Reactions  . Ivp Dye [Iodinated Diagnostic Agents] Nausea And Vomiting    Immunization History  Administered Date(s) Administered  . Influenza, High Dose Seasonal PF 09/20/2015, 09/30/2017  . Influenza,inj,Quad PF,6+ Mos 10/31/2016  . Influenza-Unspecified 10/03/2014  . Pneumococcal Conjugate-13 10/31/2016    Past Medical History:  Diagnosis Date  . A-fib ([place])   .  Atrial fibrillation ([place])   . Chicken pox   . COPD (chronic obstructive pulmonary disease) ([place])   . Diabetes ([place])   . Fibrocystic breast determined by biopsy 1977  . GERD (gastroesophageal reflux disease)   . Glaucoma   . H/O hernia repair 2006  . H/O left breast biopsy 1982  . Incisional hernia   . Lung disease    Paralyzed left hemidiaphragm  . S/P scar revision   . Uterine cancer ([place])     Tobacco  History: Social History   Tobacco Use  Smoking Status Former Smoker  . Packs/day: 0.50  . Years: 36.00  . Pack years: 18.00  . Types: Cigarettes  . Start date: 62  . Quit date: 08/23/1989  . Years since quitting: 29.0  Smokeless Tobacco Never Used   Counseling given: Not Answered   Continue to not smoke  Outpatient Encounter Medications as of 09/03/2018  Medication Sig  . ACCU-CHEK FASTCLIX LANCETS MISC USE TO TEST BLOOD GLUCOSE TWICE DAILY  . ACCU-CHEK SMARTVIEW test strip USE TO TEST BLOOD GLUCOSE 1-2 TIMES DAILY  . albuterol (VENTOLIN HFA) 108 (90 Base) MCG/ACT inhaler INHALE 2 PUFFS INTO THE LUNGS EVERY 4 (FOUR) HOURS AS NEEDED FOR WHEEZING OR SHORTNESS OF BREATH.  Marland Kitchen AMBULATORY NON FORMULARY MEDICATION Accu-Chek FastClix Lancets Drum. Use to check blood sugar twice a day. Type 2 diabetes.  . AMBULATORY NON FORMULARY MEDICATION Accu-Chek SmartView glucose testing strips. Use to check blood sugar twice a day.  Type 2 diabetes.  Marland Kitchen apixaban (ELIQUIS) 5 MG TABS tablet Take 1 tablet (5 mg total) by mouth 2 (two) times daily.  . Blood Glucose Monitoring Suppl (ACCU-CHEK NANO SMARTVIEW) w/Device KIT USE TO TEST BLOOD GLUCOSE TWICE DAILY  . budesonide-formoterol (SYMBICORT) 160-4.5 MCG/ACT inhaler Inhale 2 puffs into the lungs 2 (two) times a day.  . cholecalciferol (VITAMIN D) 1000 units tablet Take 1,000 Units by mouth at bedtime.   Marland Kitchen diltiazem (CARDIZEM CD) 240 MG 24 hr capsule TAKE 1 CAPSULE (240 MG TOTAL) BY MOUTH DAILY.  . furosemide (LASIX) 20 MG tablet TAKE 1 TABLET TWICE DAILY  . hydrochlorothiazide (MICROZIDE) 12.5 MG capsule Take 1 capsule (12.5 mg total) by mouth daily.  Marland Kitchen ipratropium-albuterol (DUONEB) 0.5-2.5 (3) MG/3ML SOLN Take 3 mLs by nebulization every 4 (four) hours as needed.  . Lancets Misc. (ACCU-CHEK FASTCLIX LANCET) KIT USE TO TEST BLOOD GLUCOSE TWICE DAILY  . latanoprost (XALATAN) 0.005 % ophthalmic solution Place 1 drop into both eyes at bedtime.  . Lutein  ([employer] LUTEIN) 40 MG CAPS Take 1 capsule by mouth daily.  . metFORMIN (GLUCOPHAGE) 500 MG tablet Take 1 tablet (500 mg total) by mouth 2 (two) times daily with a meal.  . metoprolol succinate (TOPROL-XL) 25 MG 24 hr tablet TAKE 1 TABLET (25 MG TOTAL) BY MOUTH DAILY.  . Multiple Vitamins-Minerals (PRESERVISION AREDS PO) Take 1 capsule by mouth 2 (two) times daily. Areds Preservision  . omeprazole (PRILOSEC) 20 MG capsule TAKE 1 CAPSULE (20 MG TOTAL) BY MOUTH TWO TIMES DAILY BEFORE A MEAL.  Marland Kitchen ondansetron (ZOFRAN) 4 MG tablet Take 1 tablet (4 mg total) by mouth every 6 (six) hours as needed for nausea or vomiting.  . OXYGEN Inhale 3 L into the lungs.   . potassium chloride (K-DUR) 10 MEQ tablet Take 1 tablet (10 mEq total) by mouth daily.  . benzonatate (TESSALON) 200 MG capsule Take 1 capsule (200 mg total) by mouth 3 (three) times daily as needed for cough. (Patient not taking:  Reported on 09/03/2018)  . diltiazem (CARDIZEM CD) 120 MG 24 hr capsule Take 1 capsule (120 mg total) by mouth daily as needed (as needed for Atrial Fibrillation).  . Glucosamine-Chondroit-Vit C-Mn (GLUCOSAMINE CHONDR 500 COMPLEX PO) Take 1 tablet by mouth daily.   . mometasone-formoterol (DULERA) 200-5 MCG/ACT AERO Inhale 2 puffs into the lungs 2 (two) times a day. (Patient not taking: Reported on 09/03/2018)  . Respiratory Therapy Supplies (FLUTTER) DEVI Blow through 4 times per set, 3 sets per day (Patient not taking: Reported on 09/03/2018)  . [DISCONTINUED] metFORMIN (GLUCOPHAGE) 500 MG tablet TAKE 1 TABLET TWICE DAILY WITH MEALS   No facility-administered encounter medications on file as of 09/03/2018.      Review of Systems  Review of Systems  Constitutional: Positive for fatigue. Negative for activity change and fever.  HENT: Negative for sinus pressure, sinus pain and sore throat.   Respiratory: Positive for shortness of breath. Negative for cough and wheezing.   Cardiovascular: Negative for chest pain, palpitations  and leg swelling.  Gastrointestinal: Negative for diarrhea, nausea and vomiting.  Musculoskeletal: Negative for arthralgias.       Deconditioned, weak  Neurological: Negative for dizziness.  Psychiatric/Behavioral: Negative for sleep disturbance. The patient is not nervous/anxious.      Physical Exam  BP (!) 142/70 (BP Location: Right Arm, Patient Position: Sitting, Cuff Size: Normal)   Pulse 84   Temp 97.8 F (36.6 C)   Ht 5' 1.5" (1.562 m)   Wt 221 lb (100.2 kg)   SpO2 94% Comment: 3L pulsed O2  BMI 41.08 kg/m   Wt Readings from Last 5 Encounters:  09/03/18 221 lb (100.2 kg)  07/08/18 227 lb (103 kg)  07/04/18 233 lb (105.7 kg)  06/02/18 229 lb (103.9 kg)  01/09/18 222 lb (100.7 kg)   Discussed weight.  Praised patient for continuing to weigh herself daily and working on adherence with her Lasix.  Physical Exam Vitals signs and nursing note reviewed.  Constitutional:      General: She is not in acute distress.    Appearance: Normal appearance. She is obese.     Comments: Chronically ill elderly female  HENT:     Head: Normocephalic and atraumatic.     Right Ear: Tympanic membrane, ear canal and external ear normal. There is no impacted cerumen.     Left Ear: Tympanic membrane, ear canal and external ear normal. There is no impacted cerumen.     Nose: Nose normal. No congestion or rhinorrhea.     Mouth/Throat:     Mouth: Mucous membranes are moist.     Pharynx: Oropharynx is clear.  Eyes:     Pupils: Pupils are equal, round, and reactive to light.  Neck:     Musculoskeletal: Normal range of motion.  Cardiovascular:     Rate and Rhythm: Normal rate and regular rhythm.     Pulses: Normal pulses.     Heart sounds: Normal heart sounds. No murmur.  Pulmonary:     Effort: Pulmonary effort is normal. No respiratory distress.     Breath sounds: No decreased air movement. No decreased breath sounds, wheezing or rales.     Comments: Diminished breath sounds throughout  entire exam, likely due to body habitus and low lung volumes previously seen on imaging Abdominal:     General: Abdomen is flat. Bowel sounds are normal.     Palpations: Abdomen is soft.     Comments: Obese  Musculoskeletal:     Right  lower leg: No edema.     Left lower leg: No edema.  Skin:    General: Skin is warm and dry.     Capillary Refill: Capillary refill takes less than 2 seconds.  Neurological:     General: No focal deficit present.     Mental Status: She is alert and oriented to person, place, and time. Mental status is at baseline.     Gait: Gait abnormal (In motorized wheelchair today).  Psychiatric:        Mood and Affect: Mood normal.        Behavior: Behavior normal.        Thought Content: Thought content normal.        Judgment: Judgment normal.      Lab Results:  CBC    Component Value Date/Time   WBC 10.0 07/14/2018 1129   RBC 3.53 (L) 07/14/2018 1129   HGB 9.0 (L) 07/14/2018 1129   HCT 29.3 (L) 07/14/2018 1129   PLT 270.0 07/14/2018 1129   MCV 83.2 07/14/2018 1129   MCH 25.6 (L) 07/04/2018 1625   MCHC 30.6 07/14/2018 1129   RDW 18.1 (H) 07/14/2018 1129   LYMPHSABS 2.4 07/14/2018 1129   MONOABS 0.9 07/14/2018 1129   EOSABS 0.2 07/14/2018 1129   BASOSABS 0.1 07/14/2018 1129    BMET    Component Value Date/Time   NA 143 07/04/2018 1625   NA 144 07/08/2014   K 4.0 07/04/2018 1625   CL 101 07/04/2018 1625   CO2 30 07/04/2018 1625   GLUCOSE 141 (H) 07/04/2018 1625   BUN 14 07/04/2018 1625   CREATININE 0.86 07/04/2018 1625   CALCIUM 9.7 07/04/2018 1625   CALCIUM 10.6 07/08/2014   GFRNONAA 48 (L) 07/25/2017 1403   GFRNONAA 85 12/26/2015 1002   GFRAA 56 (L) 07/25/2017 1403   GFRAA >89 12/26/2015 1002    BNP No results found for: BNP  ProBNP No results found for: PROBNP    Assessment & Plan:   Pulmonary hypertension due to lung disease [facility]) Assessment: February/2018 echocardiogram shows a PA P pressure of 88 Known emphysema on  2019 CT Unable to complete pulmonary function test today, likely restrictive lung disease Left-sided hemo-diaphragm paralysis, significant atelectasis in both right and left lungs History of obstructive sleep apnea, noncompliant with CPAP due to claustrophobia  Plan: Continue Lasix as outlined by cardiology Continue weighing yourself daily Try to adhere to a low-sodium diet Elevate legs when able Wear compression stockings when able Continue oxygen therapy as prescribed  Asthma, chronic Plan: Continue Symbicort 160 at this time, transition to [facility] 200 [facility] Work on increasing daily physical activity as able  Chronic respiratory failure ([place]) Plan: Continue oxygen use as prescribed Contact our office when you get home today to notify us with how high your oxygen concentrator at home can go Work to increase daily physical activity Monitor oxygen saturations when doing physical activity May benefit from referral to pulmonary rehab when COVID-19 restrictions are lifted  OSA (obstructive sleep apnea) Assessment: Patient received diagnosis of obstructive sleep apnea in [place], patient does not want to use CPAP therapy  Plan: Continue oxygen therapy as prescribed  Pulmonary emphysema ([place]) Plan: Continue Symbicort 160, switch to [place] 200 when approved [facility] of medications Continue oxygen therapy as prescribed Referral to clinical pharmacy team in office today Consider referral to pulmonary rehab when COVID-19 restrictions are lifted  Medication management  Plan: Continue to [facility] with cost of medications When approved switch from Symbicort 160 to [facility] 200 Referral to in-house clinical pharmacy team today to review medications  Physical deconditioning Very deconditioned today Not very active Limited based off of chronic  respiratory failure, obesity, restriction, hemo-diaphragm paralysis, and emphysema  Plan: Work to increase daily physical activity to the best your ability Monitor oxygen saturations as you increase your physical activity    Return in about 6 weeks (around 10/15/2018), or if symptoms worsen or fail to improve, for Follow up with Dr. Carlis Abbott.   Lauraine Rinne, NP 09/03/2018   This appointment was 48 minutes long with over 50% of the time in direct face-to-face patient care, assessment, plan of care, and follow-up.  This time also includes face-to-face visit with clinical pharmacist.

## 2018-09-03 ENCOUNTER — Other Ambulatory Visit: Payer: Self-pay | Admitting: Pharmacy Technician

## 2018-09-03 ENCOUNTER — Other Ambulatory Visit: Payer: Self-pay | Admitting: Pharmacist

## 2018-09-03 ENCOUNTER — Encounter: Payer: Self-pay | Admitting: Pulmonary Disease

## 2018-09-03 ENCOUNTER — Ambulatory Visit: Payer: Medicare HMO | Admitting: Pulmonary Disease

## 2018-09-03 ENCOUNTER — Other Ambulatory Visit: Payer: Self-pay

## 2018-09-03 VITALS — BP 142/70 | HR 84 | Temp 97.8°F | Ht 61.5 in | Wt 221.0 lb

## 2018-09-03 DIAGNOSIS — R5381 Other malaise: Secondary | ICD-10-CM | POA: Diagnosis not present

## 2018-09-03 DIAGNOSIS — I2723 Pulmonary hypertension due to lung diseases and hypoxia: Secondary | ICD-10-CM

## 2018-09-03 DIAGNOSIS — J439 Emphysema, unspecified: Secondary | ICD-10-CM

## 2018-09-03 DIAGNOSIS — G4733 Obstructive sleep apnea (adult) (pediatric): Secondary | ICD-10-CM | POA: Diagnosis not present

## 2018-09-03 DIAGNOSIS — Z79899 Other long term (current) drug therapy: Secondary | ICD-10-CM

## 2018-09-03 DIAGNOSIS — J9611 Chronic respiratory failure with hypoxia: Secondary | ICD-10-CM | POA: Diagnosis not present

## 2018-09-03 MED ORDER — METFORMIN HCL 500 MG PO TABS: 500.0000 mg | ORAL_TABLET | Freq: Two times a day (BID) | ORAL | 0 refills | Status: DC

## 2018-09-03 NOTE — Assessment & Plan Note (Signed)
Plan: Continue oxygen use as prescribed Contact our office when you get home today to notify us with how high your oxygen concentrator at home can go Work to increase daily physical activity Monitor oxygen saturations when doing physical activity May benefit from referral to pulmonary rehab when COVID-19 restrictions are lifted

## 2018-09-03 NOTE — Assessment & Plan Note (Signed)
Assessment: Patient received diagnosis of obstructive sleep apnea in [place], patient does not want to use CPAP therapy  Plan: Continue oxygen therapy as prescribed 

## 2018-09-03 NOTE — Assessment & Plan Note (Signed)
Very deconditioned today Not very active Limited based off of chronic respiratory failure, obesity, restriction, hemo-diaphragm paralysis, and emphysema  Plan: Work to increase daily physical activity to the best your ability Monitor oxygen saturations as you increase your physical activity

## 2018-09-03 NOTE — Telephone Encounter (Signed)
Becky Newton- pt sent this email to you:  so good to see u today. not happy about the change. LOL  my 24/7 oxygenator red lines at 5.5.  hope this is what  u wanted.  thanks  for your help.  Zain Lippy   Please advise if there is anything needed,  thanks

## 2018-09-03 NOTE — Assessment & Plan Note (Signed)
Assessment: February/2018 echocardiogram shows a PA P pressure of 88 Known emphysema on 2019 CT Unable to complete pulmonary function test today, likely restrictive lung disease Left-sided hemo-diaphragm paralysis, significant atelectasis in both right and left lungs History of obstructive sleep apnea, noncompliant with CPAP due to claustrophobia  Plan: Continue Lasix as outlined by cardiology Continue weighing yourself daily Try to adhere to a low-sodium diet Elevate legs when able Wear compression stockings when able Continue oxygen therapy as prescribed

## 2018-09-03 NOTE — Assessment & Plan Note (Addendum)
Plan: Continue Symbicort 160, switch to [place] 200 when approved Continue [facility] of medications Continue oxygen therapy as prescribed Referral to clinical pharmacy team in office today Consider referral to pulmonary rehab when COVID-19 restrictions are lifted

## 2018-09-03 NOTE — Assessment & Plan Note (Signed)
Plan: Continue to work [facility] with cost of medications When approved switch from Symbicort 160 to [facility] 200 Referral to in-house clinical pharmacy team today to review medications

## 2018-09-03 NOTE — Patient Instructions (Addendum)
You were seen today by Lauraine Rinne, NP  for:     ICD-10-CM   1. Chronic asthma, unspecified asthma severity, unspecified whether complicated, unspecified whether persistent  J45.909   2. Pulmonary emphysema, unspecified emphysema type ([place])  J43.9   3. Chronic respiratory failure with hypoxia (HCC)  J96.11   4. Morbid obesity ([place])  E66.01   5. Medication management  Z79.899     We recommend today:   Continue lasix daily as outlined by Cardiology  Continue oxygen therapy as prescribed  >>>maintain oxygen saturations greater than 88 percent  >>>if unable to maintain oxygen saturations please contact the office  >>>do not smoke with oxygen  >>>can use nasal saline gel or nasal saline rinses to moisturize nose if oxygen causes dryness  Use continuous oxygen at night  Continue Symbicort160 >>> 2 puffs in the morning right when you wake up, rinse out your mouth after use, 12 hours later 2 puffs, rinse after use >>> Take this daily, no matter what >>> This is not a rescue inhaler  Only use your albuterol as a rescue medication to be used if you can't catch your breath by resting or doing a relaxed purse lip breathing pattern.  - The less you use it, the better it will work when you need it. - Ok to use up to 2 puffs every 4 hours if you must but call for immediate appointment if use goes up over your usual need - Don't leave home without it !! (think of it like the spare tire for your car)     Patient Education for Fluid Management  Do the following things EVERY DAY:  1. Weigh yourself EVERY morning after you go to the bathroom but before you eat or drink anything. Write this number down in a weight log/diary.   2. Take your medicines as prescribed. If you have concerns about your medications, please call us before you stop taking them.   3. Eat low salt foods-Limit salt (sodium) to 2000 mg per day. This will help prevent your body from holding onto fluid. Read  food labels as many processed foods have a lot of sodium, especially canned goods and prepackaged meats. If you would like some assistance choosing low sodium foods, we would be happy to set you up with a nutritionist.  4. Stay as active as you can everyday. Staying active will give you more energy and make your muscles stronger. Start with 5 minutes at a time and work your way up to 30 minutes a day. Break up your activities--do some in the morning and some in the afternoon. Start with 3 days per week and work your way up to 5 days as you can. If you have chest pain, feel short of breath, dizzy, or lightheaded, STOP. If you don't feel better after a short rest, call 911. If you do feel better, call the office to let us know you have symptoms with exercise.  5. Limit all fluids for the day to less than 2 liters. Fluid includes all drinks, coffee, juice, ice chips, soup, jello, and all other liquids.     Follow Up:    Return in about 6 weeks (around 10/15/2018), or if symptoms worsen or fail to improve, for Follow up with Dr. Carlis Abbott.   Please do your part to reduce the spread of COVID-19:      Reduce your risk of any infection  and COVID19 by using the similar precautions used for avoiding the  common cold or flu:  Marland Kitchen Wash your hands often with soap and warm water for at least 20 seconds.  If soap and water are not readily available, use an alcohol-based hand sanitizer with at least 60% alcohol.  . If coughing or sneezing, cover your mouth and nose by coughing or sneezing into the elbow areas of your shirt or coat, into a tissue or into your sleeve (not your hands). Langley Gauss A MASK when in public  . Avoid shaking hands with others and consider head nods or verbal greetings only. . Avoid touching your eyes, nose, or mouth with unwashed hands.  . Avoid close contact with people who are sick. . Avoid places or events with large numbers of people in one location, like concerts or sporting  events. . If you have some symptoms but not all symptoms, continue to monitor at home and seek medical attention if your symptoms worsen. . If you are having a medical emergency, call 911.   [place] / e-Visit: [email or web address]         [facility]: [facility]: [phone]                   [facility]: [phone]     It is flu season:   >>> Best ways to protect herself from the flu: Receive the yearly flu vaccine, practice good hand hygiene washing with soap and also using hand sanitizer when available, eat a nutritious meals, get adequate rest, hydrate appropriately   Please contact the office if your symptoms worsen or you have concerns that you are not improving.   Thank you for choosing [facility] for your healthcare, and for allowing [place] to partner with you on your healthcare journey. I am thankful to be able to provide care to you today.   [name] FNP-C

## 2018-09-03 NOTE — Telephone Encounter (Signed)
09/03/2018 2029  Thank you for letting me know. Great seeing you today.   I would go ahead and proceed forward with going up to 5.5 L with physical exertion at home to keep oxygen saturations greater than 88%.  We can further evaluate this when you establish with Dr. Carlis Abbott.  I would like for you to consider home physical therapy were somewhat acute, and help you with increasing your daily physical exercise.  This is something that I could order and could help coordinate for you.  The other thing that we should consider is referring you to pulmonary rehab when COVID-19 restrictions are lifted.  I believe this would also help with improving your day-to-day physical activity as well as quality life.  If oxygen levels are not be maintained despite the 5.5L of O2 we will have to consider ordering a different oxygen concentrator to help you at home.  [name], FNP

## 2018-09-03 NOTE — Assessment & Plan Note (Signed)
Plan: Continue Symbicort 160 at this time, transition to [facility] 200 when [facility] Work on increasing daily physical activity as able

## 2018-09-03 NOTE — Patient Outreach (Signed)
[facility]) Care Management  09/03/2018  Becky Newton Jul 09, 1933 [id number]    Follow up call placed to [employer]  regarding patient assistance application(s) for [facility] and Proventil HFA , Marva verified application has been received. She confirms that attestation form was mailed out to patient on 8/4.   Successful call placed to patient regarding patient assistance receipt of attestation form from [place] for [employer] and Proventil HFA, HIPAA identifiers verified. Ms. Boardley states that she received the attestation form in the mail yesterday and that she filled it out and mailed everything that was requested back into them today.  Follow up:  Will follow up with [employer] in 7-10 business days to check status of application.  Maud Deed Chana Bode [place] Certified Pharmacy Technician [facility] Management Direct [phone]

## 2018-09-03 NOTE — Progress Notes (Signed)
Patient was seen today in a co-visit with [name].  Educated patient on switch from Symbicort to [facility], including maintenance vs. rescue inhaler, dosing, preparation, administration, rinsing mouth after steroid inhaler use, cleaning, use of spacer, and refills.   Patient did  express understanding by repeating back information discussed and correctly demonstrating inhaler technique.   Per [facility] tech: Received provider portion(s) of patient assistance application for [facility] and Proventil HFA. Prepared to mail completed application and required documents into [employer]. Will follow up with company in 10-14 business days.  See documentation under Winnifred Friar visit for details.

## 2018-09-03 NOTE — Addendum Note (Signed)
Addended by: Joella Prince on: 09/03/2018 01:49 PM   Modules accepted: Orders

## 2018-09-04 NOTE — Patient Outreach (Signed)
[facility]) Care Management [place]  09/04/2018  Becky Newton 1933/06/24 [id number]  Reason for referral: medication management/assistance  Incoming call from Becky Newton asking about her inhalers.  Patient states she has not heard from [employer].  Collaboration with [facility], Becky Newton to assist with this process.  Patient states she has been SOB today.  She continues on her oxygen and is taking her medications as prescribed.  Encouraged patient to taking nebulizers PRN as prescribed if having difficulty breathing despite scheduled inhalers.  She is grateful for medication assistance & management from [place].  PLAN: -I will follow up with patient next month.  Becky Newton, PharmD, [place]  [phone]

## 2018-09-04 NOTE — Telephone Encounter (Signed)
09/04/2018 1346  Okay, we can hold off on this.  I do think it be something that would benefit you though in the future.  Or could consider home health coming out and doing similar activities under the role of physical therapy.  The pharmacist that you worked with Jan I believe routed this message to [name] NP to refill your Zofran.  I would recommend contacting their office for updates if it hasnt been sent.   [name] FNP

## 2018-09-04 NOTE — Telephone Encounter (Signed)
09/04/2018 1112  I think the change of position will go okay.  I appreciate you been willing and approaching this with an open mind.  I know you had a great relationship with Dr. [place] Bells.  Thankfully he is still with her team so your new provider if they have questions they will be able to reach out to him.  Pulmonary rehab is a 16-week course at the hospital going 2-3 times a week working on increasing daily physical activity.  Overall is a very good course for somebody like yourself with emphysema as well as chronic respiratory failure.  It also will help improve your physical deconditioning while under a watchful eye of registered nurses and other healthcare providers.  If you are interested we can go ahead and place an order under Dr. Anastasia Pall name.  To get the process started and when there is an opening they can contact you.  Closest facility to you would be at [place], FNP

## 2018-09-04 NOTE — Telephone Encounter (Signed)
I live in [place] almost to [place]. let us hold off for a while.  too far and I do no fell great.  did they call in my [facility] for nausea to [facility].   we stopped 2 hrs after leaving the ofc and it  had not been called in now have to make an extra trip. thanks for taking time to converse with me.  Becky Newton

## 2018-09-06 NOTE — Progress Notes (Signed)
Reviewed, agree 

## 2018-09-08 ENCOUNTER — Encounter: Payer: Self-pay | Admitting: Adult Health

## 2018-09-09 ENCOUNTER — Encounter: Payer: Self-pay | Admitting: Adult Health

## 2018-09-09 DIAGNOSIS — Z79899 Other long term (current) drug therapy: Secondary | ICD-10-CM

## 2018-09-09 MED ORDER — ONDANSETRON HCL 4 MG PO TABS: 4.0000 mg | ORAL_TABLET | Freq: Four times a day (QID) | ORAL | 0 refills | Status: DC | PRN

## 2018-09-09 NOTE — Telephone Encounter (Signed)
Medication has been sent to the pharmacy.

## 2018-09-09 NOTE — Telephone Encounter (Signed)
Rx was originally sent to [place].  I called and cancelled that appointment.  [facility] [place].

## 2018-09-11 ENCOUNTER — Other Ambulatory Visit: Payer: Self-pay | Admitting: Pharmacy Technician

## 2018-09-11 NOTE — Patient Outreach (Signed)
[facility]) Care Management  09/11/2018  LONIE RUMMELL [birth year] [id number]    Follow up call placed to [employer] regarding patient assistance application(s) for [facility] and Proventil HFA , Junior confirms patient has been approved as of 8/17 until 01/22/19. Medication to arrive at patients home in 10-14 business days.  Follow up:  Will follow up with patient in 10-14 business days to confirm medication has been received.  Maud Deed Chana Bode [place] Certified Pharmacy Technician [place] Management Direct [phone]

## 2018-09-22 ENCOUNTER — Other Ambulatory Visit: Payer: Self-pay | Admitting: Pharmacist

## 2018-09-23 ENCOUNTER — Other Ambulatory Visit: Payer: Self-pay

## 2018-09-23 ENCOUNTER — Ambulatory Visit (INDEPENDENT_AMBULATORY_CARE_PROVIDER_SITE_OTHER): Payer: Medicare HMO | Admitting: Adult Health

## 2018-09-23 ENCOUNTER — Encounter: Payer: Self-pay | Admitting: Adult Health

## 2018-09-23 VITALS — BP 124/62 | Temp 97.8°F | Wt 221.0 lb

## 2018-09-23 DIAGNOSIS — I10 Essential (primary) hypertension: Secondary | ICD-10-CM | POA: Diagnosis not present

## 2018-09-23 DIAGNOSIS — Z0000 Encounter for general adult medical examination without abnormal findings: Secondary | ICD-10-CM

## 2018-09-23 DIAGNOSIS — E119 Type 2 diabetes mellitus without complications: Secondary | ICD-10-CM | POA: Diagnosis not present

## 2018-09-23 DIAGNOSIS — I480 Paroxysmal atrial fibrillation: Secondary | ICD-10-CM | POA: Diagnosis not present

## 2018-09-23 DIAGNOSIS — I5032 Chronic diastolic (congestive) heart failure: Secondary | ICD-10-CM

## 2018-09-23 DIAGNOSIS — R600 Localized edema: Secondary | ICD-10-CM | POA: Diagnosis not present

## 2018-09-23 DIAGNOSIS — K219 Gastro-esophageal reflux disease without esophagitis: Secondary | ICD-10-CM

## 2018-09-23 DIAGNOSIS — J441 Chronic obstructive pulmonary disease with (acute) exacerbation: Secondary | ICD-10-CM | POA: Diagnosis not present

## 2018-09-23 DIAGNOSIS — Z23 Encounter for immunization: Secondary | ICD-10-CM

## 2018-09-23 DIAGNOSIS — D509 Iron deficiency anemia, unspecified: Secondary | ICD-10-CM | POA: Diagnosis not present

## 2018-09-23 LAB — CBC WITH DIFFERENTIAL/PLATELET
Basophils Absolute: 0.1 10*3/uL (ref 0.0–0.1)
Basophils Relative: 0.8 % (ref 0.0–3.0)
Eosinophils Absolute: 0.3 10*3/uL (ref 0.0–0.7)
Eosinophils Relative: 2.7 % (ref 0.0–5.0)
HCT: 30.1 % — ABNORMAL LOW (ref 36.0–46.0)
Hemoglobin: 9.2 g/dL — ABNORMAL LOW (ref 12.0–15.0)
Lymphocytes Relative: 21 % (ref 12.0–46.0)
Lymphs Abs: 2 10*3/uL (ref 0.7–4.0)
MCHC: 30.6 g/dL (ref 30.0–36.0)
MCV: 80.5 fl (ref 78.0–100.0)
Monocytes Absolute: 0.8 10*3/uL (ref 0.1–1.0)
Monocytes Relative: 8.2 % (ref 3.0–12.0)
Neutro Abs: 6.3 10*3/uL (ref 1.4–7.7)
Neutrophils Relative %: 67.3 % (ref 43.0–77.0)
Platelets: 259 10*3/uL (ref 150.0–400.0)
RBC: 3.73 Mil/uL — ABNORMAL LOW (ref 3.87–5.11)
RDW: 17.9 % — ABNORMAL HIGH (ref 11.5–15.5)
WBC: 9.3 10*3/uL (ref 4.0–10.5)

## 2018-09-23 LAB — LIPID PANEL
Cholesterol: 158 mg/dL (ref 0–200)
HDL: 43.9 mg/dL (ref >39.00)
LDL Cholesterol: 84 mg/dL (ref 0–99)
NonHDL: 113.86
Total CHOL/HDL Ratio: 4
Triglycerides: 150 mg/dL — ABNORMAL HIGH (ref 0.0–149.0)
VLDL: 30 mg/dL (ref 0.0–40.0)

## 2018-09-23 LAB — COMPREHENSIVE METABOLIC PANEL
ALT: 7 U/L (ref 0–35)
AST: 9 U/L (ref 0–37)
Albumin: 3.5 g/dL (ref 3.5–5.2)
Alkaline Phosphatase: 67 U/L (ref 39–117)
BUN: 26 mg/dL — ABNORMAL HIGH (ref 6–23)
CO2: 40 mEq/L — ABNORMAL HIGH (ref 19–32)
Calcium: 9.9 mg/dL (ref 8.4–10.5)
Chloride: 97 mEq/L (ref 96–112)
Creatinine, Ser: 0.91 mg/dL (ref 0.40–1.20)
GFR: 59.06 mL/min — ABNORMAL LOW (ref >60.00)
Glucose, Bld: 110 mg/dL — ABNORMAL HIGH (ref 70–99)
Potassium: 3.9 mEq/L (ref 3.5–5.1)
Sodium: 144 mEq/L (ref 135–145)
Total Bilirubin: 0.3 mg/dL (ref 0.2–1.2)
Total Protein: 6.3 g/dL (ref 6.0–8.3)

## 2018-09-23 LAB — TSH: TSH: 2.45 u[IU]/mL (ref 0.35–4.50)

## 2018-09-23 LAB — IBC + FERRITIN
Ferritin: 8 ng/mL — ABNORMAL LOW (ref 10.0–291.0)
Iron: 30 ug/dL — ABNORMAL LOW (ref 42–145)
Saturation Ratios: 6.8 % — ABNORMAL LOW (ref 20.0–50.0)
Transferrin: 313 mg/dL (ref 212.0–360.0)

## 2018-09-23 LAB — HEMOGLOBIN A1C: Hgb A1c MFr Bld: 6.6 % — ABNORMAL HIGH (ref 4.6–6.5)

## 2018-09-23 MED ORDER — METOPROLOL SUCCINATE ER 25 MG PO TB24: 25.0000 mg | ORAL_TABLET | Freq: Every day | ORAL | 3 refills | Status: DC

## 2018-09-23 MED ORDER — OMEPRAZOLE 20 MG PO CPDR: DELAYED_RELEASE_CAPSULE | ORAL | 3 refills | Status: DC

## 2018-09-23 MED ORDER — ACCU-CHEK FASTCLIX LANCETS MISC: 3 refills | Status: DC

## 2018-09-23 NOTE — Progress Notes (Signed)
Subjective:    Patient ID: Becky Newton, female    DOB: Mar 17, 1933, 85 y.o.   MRN: [id number]  HPI  Patient presents for yearly preventative medicine examination. She is a pleasant 85 year old female who  has a past medical history of A-fib ([place]), Atrial fibrillation ([place]), Chicken pox, COPD (chronic obstructive pulmonary disease) ([place]), Diabetes ([place]), Fibrocystic breast determined by biopsy (1977), GERD (gastroesophageal reflux disease), Glaucoma, H/O hernia repair (2006), H/O left breast biopsy (1982), Incisional hernia, Lung disease, S/P scar revision, and Uterine cancer ([place]).   Essential Hypertension -prescribed Microzide, Lasix 20 mg BID, Cardizem, and metoprolol 25 mg extended release.  She has been monitoring her blood pressures at home and reports readings on the lower side, per her blood pressure log she has had readings mostly in 112/115-50-70 range.  She denies feeling lightheaded or dizzy.  PAF -currently managed by cardiology on Cardizem and metoprolol for rate control as well as Eliquis 5 mg.  She was last seen by cardiology in June 2020.  At that time they discussed referral to [facility] for watchman placement but patient declined.  COPD/Pulmonary Hypertension -she is followed by pulmonary.  Recently switched to Dulera 200 mg due to cost.  Is on chronic oxygen therapy at 5 L.  She continues to report dyspnea on exertion.  DM -currently prescribed metformin 500 mg twice daily.  Her blood sugars vary depending on what she eats but most of the time her readings are in the 130s to 150s. Lab Results  Component Value Date   HGBA1C 6.7 (H) 06/17/2018   Iron Deficiency Anemia -was taking oral iron supplementation but quit this due to GI issues.  We will retest her iron levels today  GERD - Well controlled on Prilosec  Lower extremity edema -has improved significantly with higher dose of Lasix   All immunizations and health maintenance protocols were reviewed with the patient and  needed orders were placed.  She is due for her high-dose flu as well as Pneumovax 23  Appropriate screening laboratory values were ordered for the patient including screening of hyperlipidemia, renal function and hepatic function.  Medication reconciliation,  past medical history, social history, problem list and allergies were reviewed in detail with the patient  Goals were established with regard to weight loss, exercise, and  diet in compliance with medications  End of life planning was discussed.    Review of Systems  Constitutional: Positive for fatigue.  HENT: Negative.   Eyes: Negative.   Respiratory: Positive for shortness of breath.   Cardiovascular: Positive for leg swelling.  Gastrointestinal: Negative.   Endocrine: Negative.   Genitourinary: Negative.   Musculoskeletal: Positive for arthralgias.  Allergic/Immunologic: Negative.   Neurological: Negative.   Hematological: Negative.   Psychiatric/Behavioral: Negative.   All other systems reviewed and are negative.      Objective:   Physical Exam Vitals signs and nursing note reviewed.  Constitutional:      Appearance: Normal appearance. She is obese.  HENT:     Right Ear: Tympanic membrane, ear canal and external ear normal. There is no impacted cerumen.     Left Ear: Tympanic membrane, ear canal and external ear normal. There is no impacted cerumen.     Nose: Nose normal. No congestion or rhinorrhea.     Mouth/Throat:     Mouth: Mucous membranes are moist.     Pharynx: Oropharynx is clear. No oropharyngeal exudate or posterior oropharyngeal erythema.  Neck:     Musculoskeletal:  Normal range of motion and neck supple.  Cardiovascular:     Rate and Rhythm: Normal rate and regular rhythm.     Pulses: Normal pulses.     Heart sounds: Normal heart sounds. No murmur. No friction rub. No gallop.   Pulmonary:     Effort: Pulmonary effort is normal.     Breath sounds: Decreased breath sounds (throughout. Likely due  to low lung volumes) present.     Comments: 02 via [place] Abdominal:     General: Bowel sounds are normal.  Musculoskeletal: Normal range of motion.     Right lower leg: No edema.     Left lower leg: No edema.  Skin:    General: Skin is warm and dry.     Capillary Refill: Capillary refill takes 2 to 3 seconds.     Coloration: Skin is not jaundiced or pale.     Findings: No bruising, erythema, lesion or rash.  Neurological:     General: No focal deficit present.     Mental Status: She is alert and oriented to person, place, and time.     Gait: Gait abnormal (in motorized wheel chair ).  Psychiatric:        Mood and Affect: Mood normal.        Behavior: Behavior normal.        Thought Content: Thought content normal.        Judgment: Judgment normal.       Assessment & Plan:  1. Routine general medical examination at a health care facility - Encouraged not to be so sedentary  - Follow up in one year or sooner if needed - CBC with Differential/Platelet - Comprehensive metabolic panel - Hemoglobin A1c - Lipid panel - TSH - IBC + Ferritin  2. Type 2 diabetes mellitus without complication, without long-term current use of insulin (HCC) - Consider increase in Metformin  Follow up in three months  - CBC with Differential/Platelet - Comprehensive metabolic panel - Hemoglobin A1c - Lipid panel - TSH - IBC + Ferritin - Accu-Chek FastClix Lancets MISC; USE TO TEST BLOOD GLUCOSE TWICE DAILY  Dispense: 200 each; Refill: 3  3. Lower extremity edema - Improved with increase dose of Lasix  - Comprehensive metabolic panel  4. Iron deficiency anemia, unspecified iron deficiency anemia type - Likely will need to be on oral iron supplement every other day  - CBC with Differential/Platelet - Comprehensive metabolic panel - Hemoglobin A1c - Lipid panel - TSH - IBC + Ferritin  5. Need for prophylactic vaccination and inoculation against influenza  - Flu Vaccine QUAD High Dose(Fluad)   6. Need for 23-polyvalent pneumococcal polysaccharide vaccine  - Pneumococcal polysaccharide vaccine 23-valent greater than or equal to 2yo subcutaneous/IM  7. Chronic diastolic CHF (congestive heart failure) ([place]) - Follow Cardiology plan of care - CBC with Differential/Platelet - Comprehensive metabolic panel - Hemoglobin A1c - Lipid panel - TSH - IBC + Ferritin  8. Paroxysmal atrial fibrillation (HCC) - Normal rate and rhythm today. Follow up with Cardiology as directed - CBC with Differential/Platelet - Comprehensive metabolic panel - Hemoglobin A1c - Lipid panel - TSH - IBC + Ferritin - metoprolol succinate (TOPROL-XL) 25 MG 24 hr tablet; Take 1 tablet (25 mg total) by mouth daily.  Dispense: 90 tablet; Refill: 3  9. COPD with acute exacerbation ([place]) - Follow up with pulmonary   10. Essential hypertension - BP well controlled and has been trending on lower side. Will have her D/c Microzide  and continue to monitor BP  - CBC with Differential/Platelet - Comprehensive metabolic panel - Hemoglobin A1c - Lipid panel - TSH - IBC + Ferritin - metoprolol succinate (TOPROL-XL) 25 MG 24 hr tablet; Take 1 tablet (25 mg total) by mouth daily.  Dispense: 90 tablet; Refill: 3  11. Gastroesophageal reflux disease without esophagitis - no change  - omeprazole (PRILOSEC) 20 MG capsule; TAKE 1 CAPSULE (20 MG TOTAL) BY MOUTH TWO TIMES DAILY BEFORE A MEAL.  Dispense: 180 capsule; Refill: 3  [name], NP

## 2018-09-24 ENCOUNTER — Encounter: Payer: Self-pay | Admitting: Adult Health

## 2018-09-24 MED ORDER — ACCU-CHEK GUIDE VI STRP: ORAL_STRIP | 3 refills | Status: DC

## 2018-09-24 NOTE — Telephone Encounter (Signed)
Rx sent to the pharmacy by e-scribe.

## 2018-09-30 NOTE — Patient Outreach (Signed)
[facility]) Care Management [place]  09/30/2018  Becky Newton 12-Dec-1933 [id number]  Reason for referral: medication assistance.  [facility] pharmacy case is being closed due to the following reasons:  -Goals of care have been met.  Instructed patient on the purpose and use of inhalers.  She is switching from Symbicort to [facility] and from Ventolin to Proventil (alubterol).  Patient verbalizes understanding.  She states she still has to use her nebulizer for breakthrough.  She is also currently using oxygen.  Counseled patient on the importance of adherence with inhalers.  Patient encouraged to call if needs arise.  Regina Eck, PharmD, [place]  [phone]

## 2018-10-02 ENCOUNTER — Encounter: Payer: Self-pay | Admitting: Adult Health

## 2018-10-02 DIAGNOSIS — E119 Type 2 diabetes mellitus without complications: Secondary | ICD-10-CM

## 2018-10-13 ENCOUNTER — Ambulatory Visit: Payer: Medicare HMO | Admitting: Pharmacist

## 2018-10-14 ENCOUNTER — Ambulatory Visit: Payer: Self-pay | Admitting: Pharmacist

## 2018-10-28 ENCOUNTER — Encounter: Payer: Self-pay | Admitting: Critical Care Medicine

## 2018-10-28 ENCOUNTER — Ambulatory Visit (INDEPENDENT_AMBULATORY_CARE_PROVIDER_SITE_OTHER): Payer: Medicare HMO

## 2018-10-28 ENCOUNTER — Ambulatory Visit: Payer: Medicare HMO | Admitting: Critical Care Medicine

## 2018-10-28 ENCOUNTER — Other Ambulatory Visit: Payer: Self-pay

## 2018-10-28 VITALS — BP 122/58 | HR 95 | Temp 98.0°F | Ht 61.5 in | Wt 223.0 lb

## 2018-10-28 DIAGNOSIS — J9611 Chronic respiratory failure with hypoxia: Secondary | ICD-10-CM

## 2018-10-28 DIAGNOSIS — R0600 Dyspnea, unspecified: Secondary | ICD-10-CM

## 2018-10-28 DIAGNOSIS — I2723 Pulmonary hypertension due to lung diseases and hypoxia: Secondary | ICD-10-CM | POA: Diagnosis not present

## 2018-10-28 DIAGNOSIS — M7989 Other specified soft tissue disorders: Secondary | ICD-10-CM

## 2018-10-28 DIAGNOSIS — R9389 Abnormal findings on diagnostic imaging of other specified body structures: Secondary | ICD-10-CM | POA: Diagnosis not present

## 2018-10-28 DIAGNOSIS — D509 Iron deficiency anemia, unspecified: Secondary | ICD-10-CM | POA: Diagnosis not present

## 2018-10-28 DIAGNOSIS — G4733 Obstructive sleep apnea (adult) (pediatric): Secondary | ICD-10-CM

## 2018-10-28 LAB — BRAIN NATRIURETIC PEPTIDE: Pro B Natriuretic peptide (BNP): 199 pg/mL — ABNORMAL HIGH (ref 0.0–100.0)

## 2018-10-28 NOTE — Progress Notes (Signed)
Patient came into office on POC 3L sating 70% bumped POC up to 5L and patient only came up to 78%. Patient put on office tank 6L continuous sats came up to 95%.   Patient states at home she couldn't get above 90% on 5.5L continuous  Patient offered for [place] to call [employer] for a tank to go home with. Patient refused. She will use her POC at 5L for ride home and drive her motorized wheelchair to door and put on continuous O2 from house.

## 2018-10-28 NOTE — Patient Instructions (Addendum)
Thank you for visiting Dr. Carlis Abbott at [facility]. We recommend the following: Orders Placed This Encounter  Procedures  . DG Chest 2 View  . B Nat Peptide  . 6 minute walk   Orders Placed This Encounter  Procedures  . DG Chest 2 View    Standing Status:   Future    Number of Occurrences:   1    Standing Expiration Date:   12/28/2019    Order Specific Question:   Reason for Exam (SYMPTOM  OR DIAGNOSIS REQUIRED)    Answer:   hypoxia    Order Specific Question:   Preferred imaging location?    Answer:   Internal    Order Specific Question:   Radiology Contrast Protocol - do NOT remove file path    Answer:   [email or web address]  . B Nat Peptide    Standing Status:   Future    Standing Expiration Date:   10/28/2019  . 6 minute walk    Standing Status:   Future    Standing Expiration Date:   10/28/2019    Order Specific Question:   Where should this test be performed?    Answer:   [facility]    No orders of the defined types were placed in this encounter.   Return in about 2 months (around 12/28/2018).    Please do your part to reduce the spread of COVID-19.

## 2018-10-28 NOTE — Progress Notes (Signed)
Synopsis: Referred in 2016 for COPD by [name], NP.  Previously cared for by Dr. Lake Bells.  Subjective:   PATIENT ID: Becky Newton GENDER: female DOB: [birth year], MRN: [id number]  Chief Complaint  Patient presents with   Follow-up    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  She has been on oxygen for about 7 years, originally due to restrictive disease related to her left hemidiaphragm paralysis and severe volume loss.  She subsequently underwent a left hemidiaphragm plication surgery.  Over time her dyspnea has worsened her oxygen requirements have subsequently increased.  At her last visit in 2019 she was using 4.5 L of oxygen with activity and 3 L at rest.  She is maintained on Symbicort for her COPD and asthma overlap.  Cardiology has maintained her on her Lasix regimen.  She has previously been unable to perform pulmonary function studies due to dyspnea.  She quit smoking in 1991 after 27 years of smoking about half a pack per day.  She was diagnosed with OSA when she was living in [place] many years ago, but due to claustrophobia she is not willing to use CPAP.  She would not want to undergo another titrating polysomnogram to try different type of mask.  At home she has been using increasing amounts of oxygen.  Her home concentrator goes to 6 L, and her husband has been keeping it from 5.5 to 6 L all the time.  At rest her saturations are usually around 90, but can drop as low as the 70s when she is walking.  This morning it was about 91% when she was walking.  She severely debilitated, only walking at most 15 to 20 feet.  Her husband is concerned that her debility is making it hard to continue to care for her, and he is motivated to keep her at home, but not sure what he would  do when he is unable to meet her needs anymore.  They live in an in-law's suite in his son's home.  She was recently diagnosed with iron deficiency anemia, but has been reluctant to take iron as it causes diarrhea.  He has not attempted to take any medications that would address this symptom.  She remains on apixaban for chronic atrial fibrillation.  She denies bloody stools, hematuria, significant bruising.  Her only complaint is left-sided epistaxis occasionally.  Today her main complaints are fatigue and overall feeling not well.  She has been having trouble sleeping, partially because of shortness of breath.  She sleeps in a chair, because she sleeps poorly at night she sleeps frequently during the day.  Her inability to lay flat is chronic.  She has frequent nocturia, but also urinates about every 2 hours during the day.  She does not cough frequently, mostly when using her Symbicort or nebulizers.  Her sputum has been baseline-white.  She infrequently misses doses of her Symbicort, and she uses her albuterol frequently.  She complains of shivering frequently, but denies wheezing, fevers, sweats, worsened edema, chest pain, abdominal distention.  Her leg edema is better in the morning, worse throughout the day.  She is up-to-date on seasonal flu and pneumonia vaccines.      Past Medical History:  Diagnosis Date   A-fib [facility])    Atrial fibrillation (HCC)    Chicken pox  COPD (chronic obstructive pulmonary disease) (HCC)    Diabetes ([place])    Fibrocystic breast determined by biopsy 1977   GERD (gastroesophageal reflux disease)    Glaucoma    H/O hernia repair 2006   H/O left breast biopsy 1982   Incisional hernia    Lung disease    Paralyzed left hemidiaphragm   S/P scar revision    Uterine cancer ([place])      Family History  Problem Relation Age of Onset   Breast cancer Mother    Arthritis Mother    Stroke Mother    Heart attack Mother    Heart disease Mother     Alcohol abuse Brother      Past Surgical History:  Procedure Laterality Date   APPENDECTOMY  1966   BREAST BIOPSY     CATARACT EXTRACTION  2004,2005   CHOLECYSTECTOMY  1975   HERNIA REPAIR  2006   HIATAL HERNIA REPAIR  7943   NISSEN FUNDOPLICATION     TOTAL ABDOMINAL HYSTERECTOMY W/ BILATERAL SALPINGOOPHORECTOMY  1974   hx of cancer     Social History   Socioeconomic History   Marital status: Married    Spouse name: Not on file   Number of children: Not on file   Years of education: Not on file   Highest education level: Not on file  Occupational History   Not on file  Social Needs   Financial resource strain: Not on file   Food insecurity    Worry: Not on file    Inability: Not on file   Transportation needs    Medical: Not on file    Non-medical: Not on file  Tobacco Use   Smoking status: Former Smoker    Packs/day: 0.50    Years: 36.00    Pack years: 18.00    Types: Cigarettes    Start date: 34    Quit date: 08/23/1989    Years since quitting: 29.2   Smokeless tobacco: Never Used  Substance and Sexual Activity   Alcohol use: No    Alcohol/week: 0.0 standard drinks   Drug use: No   Sexual activity: Never    Partners: Male  Lifestyle   Physical activity    Days per week: Not on file    Minutes per session: Not on file   Stress: Not on file  Relationships   Social connections    Talks on phone: Not on file    Gets together: Not on file    Attends religious service: Not on file    Active member of club or organization: Not on file    Attends meetings of clubs or organizations: Not on file    Relationship status: Not on file   Intimate partner violence    Fear of current or ex partner: Not on file    Emotionally abused: Not on file    Physically abused: Not on file    Forced sexual activity: Not on file  Other Topics Concern   Not on file  Social History Narrative   Not on file     Allergies  Allergen Reactions    Ivp Dye [Iodinated Diagnostic Agents] Nausea And Vomiting     Immunization History  Administered Date(s) Administered   Fluad Quad(high Dose 65+) 09/23/2018   Influenza, High Dose Seasonal PF 09/20/2015, 09/30/2017   Influenza,inj,Quad PF,6+ Mos 10/31/2016   Influenza-Unspecified 10/03/2014   Pneumococcal Conjugate-13 10/31/2016   Pneumococcal Polysaccharide-23 09/23/2018    Outpatient Medications Prior to  Visit  Medication Sig Dispense Refill   Accu-Chek FastClix Lancets MISC USE TO TEST BLOOD GLUCOSE TWICE DAILY 200 each 3   ACCU-CHEK GUIDE test strip USE TO TEST BLOOD GLUCOSE TWICE DAILY 200 each 3   albuterol (VENTOLIN HFA) 108 (90 Base) MCG/ACT inhaler INHALE 2 PUFFS INTO THE LUNGS EVERY 4 (FOUR) HOURS AS NEEDED FOR WHEEZING OR SHORTNESS OF BREATH. 1 Inhaler 5   apixaban (ELIQUIS) 5 MG TABS tablet Take 1 tablet (5 mg total) by mouth 2 (two) times daily. 180 tablet 3   budesonide-formoterol (SYMBICORT) 160-4.5 MCG/ACT inhaler Inhale 2 puffs into the lungs 2 (two) times a day. 1 Inhaler 0   cholecalciferol (VITAMIN D) 1000 units tablet Take 1,000 Units by mouth at bedtime.      diltiazem (CARDIZEM CD) 240 MG 24 hr capsule TAKE 1 CAPSULE (240 MG TOTAL) BY MOUTH DAILY. 90 capsule 3   furosemide (LASIX) 20 MG tablet TAKE 1 TABLET TWICE DAILY 180 tablet 1   Glucosamine-Chondroit-Vit C-Mn (GLUCOSAMINE CHONDR 500 COMPLEX PO) Take 1 tablet by mouth daily.      ipratropium-albuterol (DUONEB) 0.5-2.5 (3) MG/3ML SOLN Take 3 mLs by nebulization every 4 (four) hours as needed. 360 mL 3   latanoprost (XALATAN) 0.005 % ophthalmic solution Place 1 drop into both eyes at bedtime.     Lutein ([employer] LUTEIN) 40 MG CAPS Take 1 capsule by mouth daily.     metFORMIN (GLUCOPHAGE) 500 MG tablet Take 1 tablet (500 mg total) by mouth 2 (two) times daily with a meal. 180 tablet 0   metoprolol succinate (TOPROL-XL) 25 MG 24 hr tablet Take 1 tablet (25 mg total) by mouth daily. 90 tablet 3    mometasone-formoterol (DULERA) 200-5 MCG/ACT AERO Inhale 2 puffs into the lungs 2 (two) times a day. 13 g 3   Multiple Vitamins-Minerals (PRESERVISION AREDS PO) Take 1 capsule by mouth 2 (two) times daily. Areds Preservision     omeprazole (PRILOSEC) 20 MG capsule TAKE 1 CAPSULE (20 MG TOTAL) BY MOUTH TWO TIMES DAILY BEFORE A MEAL. 180 capsule 3   ondansetron (ZOFRAN) 4 MG tablet Take 1 tablet (4 mg total) by mouth every 6 (six) hours as needed for nausea or vomiting. 20 tablet 0   OXYGEN Inhale 3 L into the lungs.      potassium chloride (K-DUR) 10 MEQ tablet Take 1 tablet (10 mEq total) by mouth daily. 90 tablet 2   Respiratory Therapy Supplies (FLUTTER) DEVI Blow through 4 times per set, 3 sets per day 1 each 0   diltiazem (CARDIZEM CD) 120 MG 24 hr capsule Take 1 capsule (120 mg total) by mouth daily as needed (as needed for Atrial Fibrillation). 90 capsule 3   No facility-administered medications prior to visit.     Review of Systems  Constitutional: Positive for chills. Negative for fever and weight loss.       Always cold.  HENT: Positive for nosebleeds. Negative for congestion.   Eyes: Negative for blurred vision and redness.  Respiratory: Positive for shortness of breath. Negative for cough and wheezing.   Cardiovascular: Positive for leg swelling. Negative for chest pain and palpitations.  Gastrointestinal: Negative for constipation, heartburn, nausea and vomiting.       Diarrhea with iron supplements  Genitourinary: Positive for frequency. Negative for dysuria and hematuria.  Musculoskeletal: Negative for joint pain and myalgias.  Skin: Negative for rash.  Neurological: Negative.   Endo/Heme/Allergies: Negative.   Psychiatric/Behavioral: Negative.      Objective:  Vitals:   10/28/18 1105  BP: (!) 122/58  Pulse: 95  Temp: 98 F (36.7 C)  TempSrc: Temporal  SpO2: 95%  Weight: 223 lb (101.2 kg)  Height: 5' 1.5" (1.562 m)   95% on 6 LPM  BMI Readings  from Last 3 Encounters:  10/28/18 41.45 kg/m  09/23/18 41.08 kg/m  09/03/18 41.08 kg/m   Wt Readings from Last 3 Encounters:  10/28/18 223 lb (101.2 kg)  09/23/18 221 lb (100.2 kg)  09/03/18 221 lb (100.2 kg)    Physical Exam Vitals signs reviewed.  Constitutional:      Appearance: Normal appearance.     Comments: Chronically ill, fatigued appearing woman sitting in her wheelchair.  HENT:     Head: Normocephalic and atraumatic.     Nose:     Comments: Deferred due to masking requirement.    Mouth/Throat:     Comments: Deferred due to masking requirement. Eyes:     General: No scleral icterus. Neck:     Musculoskeletal: Neck supple.  Cardiovascular:     Rate and Rhythm: Normal rate. Rhythm irregular.     Heart sounds: No murmur.  Pulmonary:     Comments: Mild tachypnea, no accessory muscle use.  Very decreased basilar breath sounds bilaterally, but diminished throughout. Abdominal:     Comments: Obese, soft, nondistended  Musculoskeletal:        General: No deformity.     Comments: Pitting lower extremity edema bilaterally  Lymphadenopathy:     Cervical: No cervical adenopathy.  Skin:    General: Skin is warm and dry.     Findings: No rash.  Neurological:     Mental Status: She is alert.     Coordination: Coordination normal.     Comments: Answering questions appropriately, normal speech.      CBC    Component Value Date/Time   WBC 9.3 09/23/2018 0955   RBC 3.73 (L) 09/23/2018 0955   HGB 9.2 (L) 09/23/2018 0955   HCT 30.1 (L) 09/23/2018 0955   PLT 259.0 09/23/2018 0955   MCV 80.5 09/23/2018 0955   MCH 25.6 (L) 07/04/2018 1625   MCHC 30.6 09/23/2018 0955   RDW 17.9 (H) 09/23/2018 0955   LYMPHSABS 2.0 09/23/2018 0955   MONOABS 0.8 09/23/2018 0955   EOSABS 0.3 09/23/2018 0955   BASOSABS 0.1 09/23/2018 0955    CHEMISTRY CMP Latest Ref Rng & Units 09/23/2018 07/04/2018 09/25/2017  Glucose 70 - 99 mg/dL 110(H) 141(H) -  BUN 6 - 23 mg/dL 26(H) 14 -   Creatinine 0.40 - 1.20 mg/dL 0.91 0.86 -  Sodium 135 - 145 mEq/L 144 143 -  Potassium 3.5 - 5.1 mEq/L 3.9 4.0 -  Chloride 96 - 112 mEq/L 97 101 -  CO2 19 - 32 mEq/L 40(H) 30 -  Calcium 8.4 - 10.5 mg/dL 9.9 9.7 -  Total Protein 6.0 - 8.3 g/dL 6.3 6.0(L) 6.1  Total Bilirubin 0.2 - 1.2 mg/dL 0.3 0.4 0.4  Alkaline Phos 39 - 117 U/L 67 - 103  AST 0 - 37 U/L _0 ALT 0 - 35 U/L _1 Chest Imaging- films reviewed: CT chest 08/08/2017- mild emphysema with mosaicism throughout both lungs, atelectasis of medial lobe of right middle lobe, small opacity in retrocardiac left lower lobe with air bronchograms and atelectasis in the left lower lobe.  Multiple groundglass nodules.  Very generous pulmonary artery.  Patulous esophagus with possible esophageal thickening.  Surgical clips on the  posterior left hemidiaphragm-likely previous diaphragm repair.   CXR today-increased interstitial markings bilaterally, not significantly changed from previous.  Left hemidiaphragm silhouetted, cardiomegaly.  Atelectasis in the right base.  Pulmonary Functions Testing Results: No flowsheet data found. Unable to perform  Echocardiogram 03/08/2017:  LVEF 60 to 65% without regional wall motion abnormalities.  Mildly dilated left atrium.  Normal RV, but mildly dilated right atrium.  PA peak pressure 88 millimeters mercury.  Aortic sclerosis without stenosis, mild AR.  Trivial MR and TR. 2016 echocardiogram: RV systolic pressure 30 - 40 mg mercury, and normal right atrium.  Grade 1 diastolic dysfunction of LV.    Assessment & Plan:     ICD-10-CM   1. Abnormal CT of the chest  R93.89   2. Chronic respiratory failure with hypoxia (HCC)  J96.11 DG Chest 2 View    6 minute walk    Ambulatory Referral for DME  3. Morbid obesity ([place])  E66.01   4. Pulmonary hypertension due to lung disease (HCC)  I27.23 B Nat Peptide    6 minute walk    B Nat Peptide  5. OSA (obstructive sleep apnea)  G47.33   6. Iron  deficiency anemia, unspecified iron deficiency anemia type  D50.9 B Nat Peptide    B Nat Peptide  7. Leg swelling  M79.89 B Nat Peptide    B Nat Peptide      Progressive worsening of chronic hypoxic respiratory failure-I am concerned that this is due to progressive pulmonary hypertension. -Check BNP today -Chest x-ray today -Continue diuresis per cardiology -Discussed my concerns that her pulmonary hypertension is due to uncontrolled lung disease- namely chronic hypoxic respiratory failure and OSA.  She has been reluctant to escalate her oxygen from using her home concentrator, which only delivers pulsed oxygen.  She is very resistant to the idea of using oxygen tanks. -I discussed my feelings that she would get significant symptom benefit from both the diaphragm hemiparalysis and OSA with improved sleep quality by using positive pressure ventilation.  I offered to investigate different masks or nasal pillows, which she is not interested in.  She refused to do a repeat polysomnogram. - Prescribed oxygen tanks form mobility.  She was educated on the importance of using tanks instead of her concentrator to avoid hypoxic episodes. -Continue home oxygen 6 L -6-minute walk test to quantify her oxygen requirements and qualify her since I suspect she will require a new home concentrator. -Up-to-date on flu and pneumonia vaccines  Pulmonary hypertension- WHO group 3 due to lung disease, functional class IV -Optimize lung disease-see above -Checking BMP today -6-minute walk test -Very poor prognosis  COPD/ asthma overlap -Continue Symbicort -Continue albuterol as needed; suspect that her pH is contributing significantly to her dyspnea, probably more than her COPD. OSA -Discussed the risks of ongoing untreated OSA; she is declining repeat PSG or a trial of different masks.  Iron deficiency anemia-concerning with pulmonary hypertension that can affect mitochondral function of  cardiomyosites. -Encouraged her to take her iron as prescribed, which is currently every other day. -Encouraged use of prophylactic Imodium when she takes it to assist with controlling her diarrhea.  Cautioned her against using it too often as it can cause constipation.  Abnormal chest CT -Given her severely limited functional status, I do not think that she benefits from ongoing screening for lung cancer as she would not be a candidate for intervention.   In meeting Ms. Malachi for the first time, it seems that she is  chronically very ill and has poor quality of life.  She and her husband have managed for her to stay at home for a long time, but I am concerned that if she were to decompensate she would be unlikely to be able to return home again.  In future visits I will begin to broach advanced care planning and long-term goals for her care.  I anticipate that she would benefit from a palliative care referral in the future, but I do not think she is ready yet.  RTC in 2 months.   Current Outpatient Medications:    Accu-Chek FastClix Lancets MISC, USE TO TEST BLOOD GLUCOSE TWICE DAILY, Disp: 200 each, Rfl: 3   ACCU-CHEK GUIDE test strip, USE TO TEST BLOOD GLUCOSE TWICE DAILY, Disp: 200 each, Rfl: 3   albuterol (VENTOLIN HFA) 108 (90 Base) MCG/ACT inhaler, INHALE 2 PUFFS INTO THE LUNGS EVERY 4 (FOUR) HOURS AS NEEDED FOR WHEEZING OR SHORTNESS OF BREATH., Disp: 1 Inhaler, Rfl: 5   apixaban (ELIQUIS) 5 MG TABS tablet, Take 1 tablet (5 mg total) by mouth 2 (two) times daily., Disp: 180 tablet, Rfl: 3   budesonide-formoterol (SYMBICORT) 160-4.5 MCG/ACT inhaler, Inhale 2 puffs into the lungs 2 (two) times a day., Disp: 1 Inhaler, Rfl: 0   cholecalciferol (VITAMIN D) 1000 units tablet, Take 1,000 Units by mouth at bedtime. , Disp: , Rfl:    diltiazem (CARDIZEM CD) 240 MG 24 hr capsule, TAKE 1 CAPSULE (240 MG TOTAL) BY MOUTH DAILY., Disp: 90 capsule, Rfl: 3   furosemide (LASIX) 20 MG tablet,  TAKE 1 TABLET TWICE DAILY, Disp: 180 tablet, Rfl: 1   Glucosamine-Chondroit-Vit C-Mn (GLUCOSAMINE CHONDR 500 COMPLEX PO), Take 1 tablet by mouth daily. , Disp: , Rfl:    ipratropium-albuterol (DUONEB) 0.5-2.5 (3) MG/3ML SOLN, Take 3 mLs by nebulization every 4 (four) hours as needed., Disp: 360 mL, Rfl: 3   latanoprost (XALATAN) 0.005 % ophthalmic solution, Place 1 drop into both eyes at bedtime., Disp: , Rfl:    Lutein ([employer]S LUTEIN) 40 MG CAPS, Take 1 capsule by mouth daily., Disp: , Rfl:    metFORMIN (GLUCOPHAGE) 500 MG tablet, Take 1 tablet (500 mg total) by mouth 2 (two) times daily with a meal., Disp: 180 tablet, Rfl: 0   metoprolol succinate (TOPROL-XL) 25 MG 24 hr tablet, Take 1 tablet (25 mg total) by mouth daily., Disp: 90 tablet, Rfl: 3   mometasone-formoterol (DULERA) 200-5 MCG/ACT AERO, Inhale 2 puffs into the lungs 2 (two) times a day., Disp: 13 g, Rfl: 3   Multiple Vitamins-Minerals (PRESERVISION AREDS PO), Take 1 capsule by mouth 2 (two) times daily. Areds Preservision, Disp: , Rfl:    omeprazole (PRILOSEC) 20 MG capsule, TAKE 1 CAPSULE (20 MG TOTAL) BY MOUTH TWO TIMES DAILY BEFORE A MEAL., Disp: 180 capsule, Rfl: 3   ondansetron (ZOFRAN) 4 MG tablet, Take 1 tablet (4 mg total) by mouth every 6 (six) hours as needed for nausea or vomiting., Disp: 20 tablet, Rfl: 0   OXYGEN, Inhale 3 L into the lungs. , Disp: , Rfl:    potassium chloride (K-DUR) 10 MEQ tablet, Take 1 tablet (10 mEq total) by mouth daily., Disp: 90 tablet, Rfl: 2   Respiratory Therapy Supplies (FLUTTER) DEVI, Blow through 4 times per set, 3 sets per day, Disp: 1 each, Rfl: 0   diltiazem (CARDIZEM CD) 120 MG 24 hr capsule, Take 1 capsule (120 mg total) by mouth daily as needed (as needed for Atrial Fibrillation)., Disp: 90 capsule,  Rfl: 3   Julian Hy, DO [facility] 10/28/2018 2:19 PM

## 2018-10-29 ENCOUNTER — Other Ambulatory Visit: Payer: Self-pay | Admitting: Critical Care Medicine

## 2018-10-29 DIAGNOSIS — R899 Unspecified abnormal finding in specimens from other organs, systems and tissues: Secondary | ICD-10-CM

## 2018-10-29 DIAGNOSIS — J449 Chronic obstructive pulmonary disease, unspecified: Secondary | ICD-10-CM | POA: Diagnosis not present

## 2018-10-30 ENCOUNTER — Other Ambulatory Visit: Payer: Self-pay | Admitting: Adult Health

## 2018-10-30 DIAGNOSIS — R040 Epistaxis: Secondary | ICD-10-CM

## 2018-10-31 ENCOUNTER — Encounter: Payer: Self-pay | Admitting: Adult Health

## 2018-11-03 ENCOUNTER — Encounter: Payer: Self-pay | Admitting: *Deleted

## 2018-11-03 DIAGNOSIS — R040 Epistaxis: Secondary | ICD-10-CM | POA: Diagnosis not present

## 2018-11-03 NOTE — Telephone Encounter (Signed)
Dr. Carlis Abbott - please advise what amount of Lasix the pt should be taking. Thanks.

## 2018-11-17 DIAGNOSIS — H524 Presbyopia: Secondary | ICD-10-CM | POA: Diagnosis not present

## 2018-11-17 DIAGNOSIS — H35361 Drusen (degenerative) of macula, right eye: Secondary | ICD-10-CM | POA: Diagnosis not present

## 2018-11-17 DIAGNOSIS — E119 Type 2 diabetes mellitus without complications: Secondary | ICD-10-CM | POA: Diagnosis not present

## 2018-11-17 LAB — HM DIABETES EYE EXAM

## 2018-11-20 ENCOUNTER — Encounter: Payer: Self-pay | Admitting: Family Medicine

## 2018-11-28 ENCOUNTER — Encounter: Payer: Self-pay | Admitting: Adult Health

## 2018-11-28 DIAGNOSIS — E119 Type 2 diabetes mellitus without complications: Secondary | ICD-10-CM

## 2018-11-28 MED ORDER — METFORMIN HCL 500 MG PO TABS: 500.0000 mg | ORAL_TABLET | Freq: Two times a day (BID) | ORAL | 0 refills | Status: DC

## 2018-11-29 DIAGNOSIS — J449 Chronic obstructive pulmonary disease, unspecified: Secondary | ICD-10-CM | POA: Diagnosis not present

## 2018-12-04 ENCOUNTER — Telehealth: Payer: Self-pay | Admitting: Critical Care Medicine

## 2018-12-04 ENCOUNTER — Encounter: Payer: Self-pay | Admitting: Cardiology

## 2018-12-04 NOTE — Telephone Encounter (Signed)
  This encounter was created in error - please disregard.

## 2018-12-04 NOTE — Telephone Encounter (Signed)
Pt dropped paperwork off for [employer] for medication. Left with Luisa Dago.

## 2018-12-04 NOTE — Telephone Encounter (Signed)
Dr. Carlis Abbott, I will place in your box in Pod C.

## 2018-12-12 NOTE — Telephone Encounter (Signed)
Paperwork was in Dr. Ainsley Spinner review folder which I gave to her Wednesday, 11/18.  Lauren, please advise if this has been taken care of. Thanks!

## 2018-12-12 NOTE — Telephone Encounter (Signed)
Sending message to Dr. Carlis Abbott

## 2018-12-13 ENCOUNTER — Encounter: Payer: Self-pay | Admitting: Adult Health

## 2018-12-13 DIAGNOSIS — I480 Paroxysmal atrial fibrillation: Secondary | ICD-10-CM

## 2018-12-13 DIAGNOSIS — R600 Localized edema: Secondary | ICD-10-CM

## 2018-12-13 DIAGNOSIS — I10 Essential (primary) hypertension: Secondary | ICD-10-CM

## 2018-12-15 MED ORDER — FUROSEMIDE 20 MG PO TABS: 20.0000 mg | ORAL_TABLET | Freq: Two times a day (BID) | ORAL | 3 refills | Status: DC

## 2018-12-15 MED ORDER — METOPROLOL SUCCINATE ER 25 MG PO TB24: 25.0000 mg | ORAL_TABLET | Freq: Every day | ORAL | 3 refills | Status: AC

## 2018-12-15 NOTE — Telephone Encounter (Signed)
Yes I believe I signed it and gave it back on Wednesday last week. I worked with Raquel Sarna that day, and I think I did it at the end of the day.  LPC

## 2018-12-16 NOTE — Telephone Encounter (Signed)
That paperwork was still in her sign folder the day I worked with her. The only thing that was handed to me was a prescription that needed to be signed for another pt. I have not seen this paperwork since I gave it to her last Wednesday after I placed it in her folder.

## 2018-12-16 NOTE — Telephone Encounter (Signed)
LMCTB  Unfortunately paper work has not been able to be located.

## 2018-12-16 NOTE — Telephone Encounter (Signed)
Emily per [facility] paperwork was given to you. Can you please let us know if you have paper work.

## 2018-12-17 NOTE — Telephone Encounter (Signed)
Will route to [place] for office visit.

## 2018-12-17 NOTE — Telephone Encounter (Signed)
I called and spoke with the patient and she states that the paperwork is for [facility] patient assistance. She states that her husband dropped them off for Dr. Carlis Abbott to sign. I explained to her that the paperwork cannot be located and she states that she has a copy that she is keeping in her car for her office visit with Dr. Carlis Abbott in 12/29/18. She states that she will bring it in then and have her sign it and fax it. Nothing further is needed.

## 2018-12-23 ENCOUNTER — Other Ambulatory Visit: Payer: Self-pay | Admitting: Cardiology

## 2018-12-25 ENCOUNTER — Other Ambulatory Visit: Payer: Self-pay

## 2018-12-25 MED ORDER — APIXABAN 5 MG PO TABS: 5.0000 mg | ORAL_TABLET | Freq: Two times a day (BID) | ORAL | 1 refills | Status: DC

## 2018-12-26 ENCOUNTER — Encounter: Payer: Self-pay | Admitting: Adult Health

## 2018-12-29 ENCOUNTER — Other Ambulatory Visit: Payer: Self-pay

## 2018-12-29 ENCOUNTER — Telehealth: Payer: Self-pay

## 2018-12-29 ENCOUNTER — Ambulatory Visit: Payer: Medicare HMO | Admitting: Critical Care Medicine

## 2018-12-29 ENCOUNTER — Encounter: Payer: Self-pay | Admitting: Critical Care Medicine

## 2018-12-29 VITALS — BP 130/60 | HR 68 | Temp 97.2°F | Ht 61.5 in | Wt 220.0 lb

## 2018-12-29 DIAGNOSIS — J9611 Chronic respiratory failure with hypoxia: Secondary | ICD-10-CM

## 2018-12-29 DIAGNOSIS — R5381 Other malaise: Secondary | ICD-10-CM | POA: Diagnosis not present

## 2018-12-29 DIAGNOSIS — J439 Emphysema, unspecified: Secondary | ICD-10-CM | POA: Diagnosis not present

## 2018-12-29 DIAGNOSIS — J449 Chronic obstructive pulmonary disease, unspecified: Secondary | ICD-10-CM | POA: Diagnosis not present

## 2018-12-29 DIAGNOSIS — I2723 Pulmonary hypertension due to lung diseases and hypoxia: Secondary | ICD-10-CM | POA: Diagnosis not present

## 2018-12-29 NOTE — Telephone Encounter (Signed)
During visit patient gave [place] paperwork for patient assistance. Dr. Carlis Abbott filled out paperwork and has been mailed to [employer].

## 2018-12-29 NOTE — Patient Instructions (Addendum)
Thank you for visiting Dr. Carlis Abbott at [facility]. We recommend the following:  Keep [place] most forms at home easily accessible.   Return in about 3 months (around 03/29/2019). with Dr. Carlis Abbott.   Please do your part to reduce the spread of COVID-19.

## 2018-12-29 NOTE — Progress Notes (Signed)
Synopsis: Referred in 2016 for COPD by [name], NP.  Previously cared for by Dr. Lake Bells.  Subjective:   PATIENT ID: Becky Newton GENDER: female DOB: 10-Sep-1933, MRN: [id number]  Chief Complaint  Patient presents with  . Follow-up    Patient is here for follow up. Patient is supposed to be on 3L of oxygen but has been using 6L all the time.     Becky Newton is an 85 year old woman with a history of chronic hypoxic respiratory failure and COPD-asthma overlap with who group 3 pulmonary hypertension who presents with her husband for follow-up.  At her last visit she was persistently hypoxic on her portable oxygen concentrator on 3 L, and has since been on 6 L all the time.  She has noticed that that has improved her symptoms.  She has not had an exacerbation recently.  At her last visit her diuretics were increased to twice daily, and she is in the bathroom frequently throughout the day.  She wakes up sometimes at night to go to the bathroom.  She notices that at night her saturations after coming back from the bathroom will be in the upper 80s and take a few minutes to recover to the 90s, but this does not happen during the day. Her husband thinks this is due to mouth breathing. She has an oxygen mask but does not like wearing it due to claustrophobia.  Her oxygen concentrator is up to 6.5 L at night.  She was switched from Symbicort to [facility] maintenance inhaler, and has paperwork for her insurance company for [employer]. Her symptoms are stable. She is up-to-date on flu and pneumonia shots.  She is continued social distancing and mask wearing for Covid precautions.  She mentioned that she has advanced care planning documents in her chart, but has never signed [facility] form, but is interested in doing this.       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  She has been on oxygen for about 7 years, originally due to restrictive disease related to her left hemidiaphragm paralysis and severe volume loss.  She subsequently underwent a left hemidiaphragm plication surgery.  Over time her dyspnea has worsened her oxygen requirements have subsequently increased.  At her last visit in 2019 she was using 4.5 L of oxygen with activity and 3 L at rest.  She is maintained on Symbicort for her COPD and asthma overlap.  Cardiology has maintained her on her Lasix regimen.  She has previously been unable to perform pulmonary function studies due to dyspnea.  She quit smoking in 1991 after 27 years of smoking about half a pack per day.  She was diagnosed with OSA when she was living in [place] many years ago, but due to claustrophobia she is not willing to use CPAP.  She would not want to undergo another titrating polysomnogram to try different type of mask.  At home she has been using increasing amounts of oxygen.  Her home concentrator goes to 6 L, and her husband has been keeping it from 5.5 to 6 L all the time.  At rest her saturations are usually around 90, but can drop as low as the 70s when she is walking.  This morning it was about 91% when she was walking.  She severely debilitated,  only walking at most 15 to 20 feet.  Her husband is concerned that her debility is making it hard to continue to care for her, and he is motivated to keep her at home, but not sure what he would do when he is unable to meet her needs anymore.  They live in an in-law's suite in his son's home.  She was recently diagnosed with iron deficiency anemia, but has been reluctant to take iron as it causes diarrhea.  He has not attempted to take any medications that would address this symptom.  She remains on apixaban for chronic atrial fibrillation.  She denies bloody stools, hematuria, significant  bruising.  Her only complaint is left-sided epistaxis occasionally.  Today her main complaints are fatigue and overall feeling not well.  She has been having trouble sleeping, partially because of shortness of breath.  She sleeps in a chair, because she sleeps poorly at night she sleeps frequently during the day.  Her inability to lay flat is chronic.  She has frequent nocturia, but also urinates about every 2 hours during the day.  She does not cough frequently, mostly when using her Symbicort or nebulizers.  Her sputum has been baseline-white.  She infrequently misses doses of her Symbicort, and she uses her albuterol frequently.  She complains of shivering frequently, but denies wheezing, fevers, sweats, worsened edema, chest pain, abdominal distention.  Her leg edema is better in the morning, worse throughout the day.  She is up-to-date on seasonal flu and pneumonia vaccines.    Past Medical History:  Diagnosis Date  . A-fib ([place])   . Atrial fibrillation ([place])   . Chicken pox   . COPD (chronic obstructive pulmonary disease) ([place])   . Diabetes ([place])   . DNR (do not resuscitate) 2020   [place] MOST on 12/29/2018, advanced care planning documents in chart  . Fibrocystic breast determined by biopsy 1977  . GERD (gastroesophageal reflux disease)   . Glaucoma   . H/O hernia repair 2006  . H/O left breast biopsy 1982  . Incisional hernia   . Lung disease    Paralyzed left hemidiaphragm  . S/P scar revision   . Uterine cancer ([place])      Family History  Problem Relation Age of Onset  . Breast cancer Mother   . Arthritis Mother   . Stroke Mother   . Heart attack Mother   . Heart disease Mother   . Alcohol abuse Brother      Past Surgical History:  Procedure Laterality Date  . APPENDECTOMY  1966  . BREAST BIOPSY    . CATARACT EXTRACTION  2004,2005  . CHOLECYSTECTOMY  1975  . HERNIA REPAIR  2006  . HIATAL HERNIA REPAIR  1966  . NISSEN FUNDOPLICATION    . TOTAL ABDOMINAL HYSTERECTOMY W/  BILATERAL SALPINGOOPHORECTOMY  1974   hx of cancer     Social History   Socioeconomic History  . Marital status: Married    Spouse name: Not on file  . Number of children: Not on file  . Years of education: Not on file  . Highest education level: Not on file  Occupational History  . Not on file  Social Needs  . Financial resource strain: Not on file  . Food insecurity    Worry: Not on file    Inability: Not on file  . Transportation needs    Medical: Not on file    Non-medical: Not on file  Tobacco Use  . Smoking  status: Former Smoker    Packs/day: 0.50    Years: 36.00    Pack years: 18.00    Types: Cigarettes    Start date: 38    Quit date: 08/23/1989    Years since quitting: 29.3  . Smokeless tobacco: Never Used  Substance and Sexual Activity  . Alcohol use: No    Alcohol/week: 0.0 standard drinks  . Drug use: No  . Sexual activity: Never    Partners: Male  Lifestyle  . Physical activity    Days per week: Not on file    Minutes per session: Not on file  . Stress: Not on file  Relationships  . Social Herbalist on phone: Not on file    Gets together: Not on file    Attends religious service: Not on file    Active member of club or organization: Not on file    Attends meetings of clubs or organizations: Not on file    Relationship status: Not on file  . Intimate partner violence    Fear of current or ex partner: Not on file    Emotionally abused: Not on file    Physically abused: Not on file    Forced sexual activity: Not on file  Other Topics Concern  . Not on file  Social History Narrative  . Not on file     Allergies  Allergen Reactions  . Ivp Dye [Iodinated Diagnostic Agents] Nausea And Vomiting     Immunization History  Administered Date(s) Administered  . Fluad Quad(high Dose 65+) 09/23/2018  . Influenza, High Dose Seasonal PF 09/20/2015, 09/30/2017  . Influenza,inj,Quad PF,6+ Mos 10/31/2016  . Influenza-Unspecified 10/03/2014   . Pneumococcal Conjugate-13 10/31/2016  . Pneumococcal Polysaccharide-23 09/23/2018    Outpatient Medications Prior to Visit  Medication Sig Dispense Refill  . Accu-Chek FastClix Lancets MISC USE TO TEST BLOOD GLUCOSE TWICE DAILY 200 each 3  . ACCU-CHEK GUIDE test strip USE TO TEST BLOOD GLUCOSE TWICE DAILY 200 each 3  . albuterol (VENTOLIN HFA) 108 (90 Base) MCG/ACT inhaler INHALE 2 PUFFS INTO THE LUNGS EVERY 4 (FOUR) HOURS AS NEEDED FOR WHEEZING OR SHORTNESS OF BREATH. 1 Inhaler 5  . apixaban (ELIQUIS) 5 MG TABS tablet Take 1 tablet (5 mg total) by mouth 2 (two) times daily. 180 tablet 1  . cholecalciferol (VITAMIN D) 1000 units tablet Take 1,000 Units by mouth at bedtime.     Marland Kitchen diltiazem (CARDIZEM CD) 120 MG 24 hr capsule Take 1 capsule (120 mg total) by mouth daily as needed (as needed for Atrial Fibrillation). 90 capsule 3  . diltiazem (CARDIZEM CD) 240 MG 24 hr capsule TAKE 1 CAPSULE (240 MG TOTAL) BY MOUTH DAILY. 90 capsule 3  . furosemide (LASIX) 20 MG tablet Take 1 tablet (20 mg total) by mouth 2 (two) times daily. 180 tablet 3  . Glucosamine-Chondroit-Vit C-Mn (GLUCOSAMINE CHONDR 500 COMPLEX PO) Take 1 tablet by mouth daily.     Marland Kitchen ipratropium-albuterol (DUONEB) 0.5-2.5 (3) MG/3ML SOLN Take 3 mLs by nebulization every 4 (four) hours as needed. 360 mL 3  . latanoprost (XALATAN) 0.005 % ophthalmic solution Place 1 drop into both eyes at bedtime.    . metFORMIN (GLUCOPHAGE) 500 MG tablet Take 1 tablet (500 mg total) by mouth 2 (two) times daily with a meal. 180 tablet 0  . metoprolol succinate (TOPROL-XL) 25 MG 24 hr tablet Take 1 tablet (25 mg total) by mouth daily. 90 tablet 3  . mometasone-formoterol (DULERA) 200-5  MCG/ACT AERO Inhale 2 puffs into the lungs 2 (two) times a day. 13 g 3  . omeprazole (PRILOSEC) 20 MG capsule TAKE 1 CAPSULE (20 MG TOTAL) BY MOUTH TWO TIMES DAILY BEFORE A MEAL. 180 capsule 3  . ondansetron (ZOFRAN) 4 MG tablet Take 1 tablet (4 mg total) by mouth every 6  (six) hours as needed for nausea or vomiting. 20 tablet 0  . OXYGEN Inhale 3 L into the lungs.     . potassium chloride (K-DUR) 10 MEQ tablet Take 1 tablet (10 mEq total) by mouth daily. 90 tablet 2  . Respiratory Therapy Supplies (FLUTTER) DEVI Blow through 4 times per set, 3 sets per day 1 each 0  . Lutein ([employer] LUTEIN) 40 MG CAPS Take 1 capsule by mouth daily.    . budesonide-formoterol (SYMBICORT) 160-4.5 MCG/ACT inhaler Inhale 2 puffs into the lungs 2 (two) times a day. (Patient not taking: Reported on 12/29/2018) 1 Inhaler 0  . Multiple Vitamins-Minerals (PRESERVISION AREDS PO) Take 1 capsule by mouth 2 (two) times daily. Areds Preservision     No facility-administered medications prior to visit.     Review of Systems  Constitutional: Positive for chills. Negative for fever and weight loss.       Always cold.  HENT: Positive for nosebleeds. Negative for congestion.   Eyes: Negative for blurred vision and redness.  Respiratory: Positive for shortness of breath. Negative for cough and wheezing.   Cardiovascular: Positive for leg swelling. Negative for chest pain and palpitations.  Gastrointestinal: Negative for constipation, heartburn, nausea and vomiting.       Diarrhea with iron supplements  Genitourinary: Positive for frequency. Negative for dysuria and hematuria.  Musculoskeletal: Negative for joint pain and myalgias.  Skin: Negative for rash.  Neurological: Negative.   Endo/Heme/Allergies: Negative.   Psychiatric/Behavioral: Negative.      Objective:   Vitals:   12/29/18 1148  BP: 130/60  Pulse: 68  Temp: (!) 97.2 F (36.2 C)  TempSrc: Temporal  SpO2: 94%  Weight: 220 lb (99.8 kg)  Height: 5' 1.5" (1.562 m)   94% on 6 LPM  BMI Readings from Last 3 Encounters:  12/29/18 40.90 kg/m  10/28/18 41.45 kg/m  09/23/18 41.08 kg/m   Wt Readings from Last 3 Encounters:  12/29/18 220 lb (99.8 kg)  10/28/18 223 lb (101.2 kg)  09/23/18 221 lb (100.2 kg)    Physical  Exam Vitals signs reviewed.  Constitutional:      General: She is not in acute distress.    Appearance: Normal appearance. She is obese. She is not ill-appearing.     Comments: Chronically ill, fatigued appearing woman sitting in her wheelchair.  HENT:     Head: Normocephalic and atraumatic.     Nose:     Comments: Deferred due to masking requirement.    Mouth/Throat:     Comments: Deferred due to masking requirement. Eyes:     General: No scleral icterus. Neck:     Musculoskeletal: Neck supple.  Cardiovascular:     Rate and Rhythm: Normal rate. Rhythm irregular.     Heart sounds: No murmur.  Pulmonary:     Comments: Breathing comfortably on 6L [place]. No conversational dyspnea. No wheezing. Abdominal:     General: There is no distension.     Palpations: Abdomen is soft.     Tenderness: There is no abdominal tenderness.     Comments: Obese, soft, nondistended  Musculoskeletal:        General: No deformity.  Comments: Pitting lower extremity edema bilaterally, but improved compared to previous exam.  Lymphadenopathy:     Cervical: No cervical adenopathy.  Skin:    General: Skin is warm and dry.     Findings: No rash.  Neurological:     General: No focal deficit present.     Mental Status: She is alert.     Motor: No weakness.     Coordination: Coordination normal.     Comments: Answering questions appropriately, normal speech. Riding on scooter.   Psychiatric:        Mood and Affect: Mood normal.        Behavior: Behavior normal.      CBC    Component Value Date/Time   WBC 9.3 09/23/2018 0955   RBC 3.73 (L) 09/23/2018 0955   HGB 9.2 (L) 09/23/2018 0955   HCT 30.1 (L) 09/23/2018 0955   PLT 259.0 09/23/2018 0955   MCV 80.5 09/23/2018 0955   MCH 25.6 (L) 07/04/2018 1625   MCHC 30.6 09/23/2018 0955   RDW 17.9 (H) 09/23/2018 0955   LYMPHSABS 2.0 09/23/2018 0955   MONOABS 0.8 09/23/2018 0955   EOSABS 0.3 09/23/2018 0955   BASOSABS 0.1 09/23/2018 0955     CHEMISTRY CMP Latest Ref Rng & Units 09/23/2018 07/04/2018 09/25/2017  Glucose 70 - 99 mg/dL 110(H) 141(H) -  BUN 6 - 23 mg/dL 26(H) 14 -  Creatinine 0.40 - 1.20 mg/dL 0.91 0.86 -  Sodium 135 - 145 mEq/L 144 143 -  Potassium 3.5 - 5.1 mEq/L 3.9 4.0 -  Chloride 96 - 112 mEq/L 97 101 -  CO2 19 - 32 mEq/L 40(H) 30 -  Calcium 8.4 - 10.5 mg/dL 9.9 9.7 -  Total Protein 6.0 - 8.3 g/dL 6.3 6.0(L) 6.1  Total Bilirubin 0.2 - 1.2 mg/dL 0.3 0.4 0.4  Alkaline Phos 39 - 117 U/L 67 - 103  AST 0 - 37 U/L _0 ALT 0 - 35 U/L _1 BNP 199 on 10/28/2018   Chest Imaging- films reviewed: CT chest 08/08/2017- mild emphysema with mosaicism throughout both lungs, atelectasis of medial lobe of right middle lobe, small opacity in retrocardiac left lower lobe with air bronchograms and atelectasis in the left lower lobe.  Multiple groundglass nodules.  Very generous pulmonary artery.  Patulous esophagus with possible esophageal thickening.  Surgical clips on the posterior left hemidiaphragm-likely previous diaphragm repair.   CXR today-increased interstitial markings bilaterally, not significantly changed from previous.  Left hemidiaphragm silhouetted, cardiomegaly.  Atelectasis in the right base.  Pulmonary Functions Testing Results: No flowsheet data found. Unable to perform.  Echocardiogram 03/08/2017:  LVEF 60 to 65% without regional wall motion abnormalities.  Mildly dilated left atrium.  Normal RV, but mildly dilated right atrium.  PA peak pressure 88 millimeters mercury.  Aortic sclerosis without stenosis, mild AR.  Trivial MR and TR. 2016 echocardiogram: RV systolic pressure 30 - 40 mg mercury, and normal right atrium.  Grade 1 diastolic dysfunction of LV.    Assessment & Plan:     ICD-10-CM   1. Chronic respiratory failure with hypoxia (HCC)  J96.11   2. Pulmonary hypertension due to lung disease (HCC)  I27.23   3. Pulmonary emphysema, unspecified emphysema type ([place])  J43.9   4. Physical  deconditioning  R53.81   5. Morbid obesity (HCC)  E66.01     Chronic hypoxic respiratory failure on 6 L home oxygen- stable. Multifactorial in etiology- COPD, likely restriction from obesity (cannot  confirm without PFTs) and hemidiaphragm paralysis, and WHO group III PH. -Continue supplemental oxygen at 6L -Her husband has outfitted her scooter with places to hold two oxygen tanks. -Continue to minimize risk factors for exacerbations-continue social distancing, mask wearing, handwashing. -We have previously discussed that it would likely be beneficial to use noninvasive positive pressure ventilation at night, but due to claustrophobia she would be unable to tolerate this. -Up-to-date on flu and pneumonia shots. -Recommend modest weight loss as a long-term goal  Pulmonary hypertension- WHO group 3 due to lung disease, functional class IV.  Previously elevated BNP level. -Continue supplemental oxygen at 6 L -Current diuretic regimen seems more adequate based on physical exam -Very poor prognosis, which she seems to have good insight into.  She and her husband wanted to be sure that I was able to see her advance care documents from her PCP.  We discussed an [place] MOST form, and they wanted to fill this out.  She is DNR/DNI, I would only want very limited acute care measures on a trial limited basis.  She would not want ICU admission.  She would only want short-term IV fluids or antibiotics for a reversible condition.  I think given the severity of her chronic medical conditions this is an appropriate approach to her care.  COPD/ asthma overlap -Continue [place]-paperwork for patient assistance filled out and return to the patient -Continue albuterol as needed; suspect that her pH is contributing significantly to her dyspnea, probably more than her COPD.  OSA -Discussed the risks of ongoing untreated OSA; she is declining repeat PSG or a trial of different masks.   RTC in 3 months.  40 minutes  spent on this encounter with greater than 50% spent face-to-face with the patient and her husband.  Most of the time was spent with advanced care planning and completing [facility] paperwork.   Current Outpatient Medications:  .  Accu-Chek FastClix Lancets MISC, USE TO TEST BLOOD GLUCOSE TWICE DAILY, Disp: 200 each, Rfl: 3 .  ACCU-CHEK GUIDE test strip, USE TO TEST BLOOD GLUCOSE TWICE DAILY, Disp: 200 each, Rfl: 3 .  albuterol (VENTOLIN HFA) 108 (90 Base) MCG/ACT inhaler, INHALE 2 PUFFS INTO THE LUNGS EVERY 4 (FOUR) HOURS AS NEEDED FOR WHEEZING OR SHORTNESS OF BREATH., Disp: 1 Inhaler, Rfl: 5 .  apixaban (ELIQUIS) 5 MG TABS tablet, Take 1 tablet (5 mg total) by mouth 2 (two) times daily., Disp: 180 tablet, Rfl: 1 .  cholecalciferol (VITAMIN D) 1000 units tablet, Take 1,000 Units by mouth at bedtime. , Disp: , Rfl:  .  diltiazem (CARDIZEM CD) 120 MG 24 hr capsule, Take 1 capsule (120 mg total) by mouth daily as needed (as needed for Atrial Fibrillation)., Disp: 90 capsule, Rfl: 3 .  diltiazem (CARDIZEM CD) 240 MG 24 hr capsule, TAKE 1 CAPSULE (240 MG TOTAL) BY MOUTH DAILY., Disp: 90 capsule, Rfl: 3 .  furosemide (LASIX) 20 MG tablet, Take 1 tablet (20 mg total) by mouth 2 (two) times daily., Disp: 180 tablet, Rfl: 3 .  Glucosamine-Chondroit-Vit C-Mn (GLUCOSAMINE CHONDR 500 COMPLEX PO), Take 1 tablet by mouth daily. , Disp: , Rfl:  .  ipratropium-albuterol (DUONEB) 0.5-2.5 (3) MG/3ML SOLN, Take 3 mLs by nebulization every 4 (four) hours as needed., Disp: 360 mL, Rfl: 3 .  latanoprost (XALATAN) 0.005 % ophthalmic solution, Place 1 drop into both eyes at bedtime., Disp: , Rfl:  .  metFORMIN (GLUCOPHAGE) 500 MG tablet, Take 1 tablet (500 mg total) by mouth 2 (  two) times daily with a meal., Disp: 180 tablet, Rfl: 0 .  metoprolol succinate (TOPROL-XL) 25 MG 24 hr tablet, Take 1 tablet (25 mg total) by mouth daily., Disp: 90 tablet, Rfl: 3 .  mometasone-formoterol (DULERA) 200-5 MCG/ACT AERO,  Inhale 2 puffs into the lungs 2 (two) times a day., Disp: 13 g, Rfl: 3 .  omeprazole (PRILOSEC) 20 MG capsule, TAKE 1 CAPSULE (20 MG TOTAL) BY MOUTH TWO TIMES DAILY BEFORE A MEAL., Disp: 180 capsule, Rfl: 3 .  ondansetron (ZOFRAN) 4 MG tablet, Take 1 tablet (4 mg total) by mouth every 6 (six) hours as needed for nausea or vomiting., Disp: 20 tablet, Rfl: 0 .  OXYGEN, Inhale 3 L into the lungs. , Disp: , Rfl:  .  potassium chloride (K-DUR) 10 MEQ tablet, Take 1 tablet (10 mEq total) by mouth daily., Disp: 90 tablet, Rfl: 2 .  Respiratory Therapy Supplies (FLUTTER) DEVI, Blow through 4 times per set, 3 sets per day, Disp: 1 each, Rfl: 0 .  budesonide-formoterol (SYMBICORT) 160-4.5 MCG/ACT inhaler, Inhale 2 puffs into the lungs 2 (two) times a day. (Patient not taking: Reported on 12/29/2018), Disp: 1 Inhaler, Rfl: 0 .  Multiple Vitamins-Minerals (PRESERVISION AREDS PO), Take 1 capsule by mouth 2 (two) times daily. Areds Preservision, Disp: , Rfl:    Julian Hy, DO [facility] 12/29/2018 1:10 PM

## 2018-12-30 ENCOUNTER — Ambulatory Visit (INDEPENDENT_AMBULATORY_CARE_PROVIDER_SITE_OTHER): Payer: Medicare HMO | Admitting: Adult Health

## 2018-12-30 ENCOUNTER — Encounter: Payer: Self-pay | Admitting: Adult Health

## 2018-12-30 VITALS — BP 122/64 | Temp 97.7°F | Wt 220.0 lb

## 2018-12-30 DIAGNOSIS — E119 Type 2 diabetes mellitus without complications: Secondary | ICD-10-CM

## 2018-12-30 LAB — POCT GLYCOSYLATED HEMOGLOBIN (HGB A1C): HbA1c, POC (controlled diabetic range): 5.6 % (ref 0.0–7.0)

## 2018-12-30 NOTE — Progress Notes (Signed)
Subjective:    Patient ID: Becky Newton, female    DOB: 08/15/1933, 85 y.o.   MRN: [id number]  HPI   85 year old female who  has a past medical history of A-fib [facility]), Atrial fibrillation ([place]), Chicken pox, COPD (chronic obstructive pulmonary disease) ([place]), Diabetes ([place]), DNR (do not resuscitate) (2020), Fibrocystic breast determined by biopsy (1977), GERD (gastroesophageal reflux disease), Glaucoma, H/O hernia repair (2006), H/O left breast biopsy (1982), Incisional hernia, Lung disease, S/P scar revision, and Uterine cancer ([place]).  She presents to the office today for three month follow up regarding diabetes mellitus. She is currently prescribed Metformin 500 mg BID. Her blood sugar readings are consistently in the 100-150. She is unable to exercise due to severe pulmonary disease.  She denies symptoms of hypoglycemia    Lab Results  Component Value Date   HGBA1C 6.6 (H) 09/23/2018    Review of Systems See HPI   Past Medical History:  Diagnosis Date  . A-fib ([place])   . Atrial fibrillation ([place])   . Chicken pox   . COPD (chronic obstructive pulmonary disease) ([place])   . Diabetes ([place])   . DNR (do not resuscitate) 2020   [place] MOST on 12/29/2018, advanced care planning documents in chart  . Fibrocystic breast determined by biopsy 1977  . GERD (gastroesophageal reflux disease)   . Glaucoma   . H/O hernia repair 2006  . H/O left breast biopsy 1982  . Incisional hernia   . Lung disease    Paralyzed left hemidiaphragm  . S/P scar revision   . Uterine cancer [facility])     Social History   Socioeconomic History  . Marital status: Married    Spouse name: Not on file  . Number of children: Not on file  . Years of education: Not on file  . Highest education level: Not on file  Occupational History  . Not on file  Social Needs  . Financial resource strain: Not on file  . Food insecurity    Worry: Not on file    Inability: Not on file  . Transportation needs    Medical: Not  on file    Non-medical: Not on file  Tobacco Use  . Smoking status: Former Smoker    Packs/day: 0.50    Years: 36.00    Pack years: 18.00    Types: Cigarettes    Start date: 9    Quit date: 08/23/1989    Years since quitting: 29.3  . Smokeless tobacco: Never Used  Substance and Sexual Activity  . Alcohol use: No    Alcohol/week: 0.0 standard drinks  . Drug use: No  . Sexual activity: Never    Partners: Male  Lifestyle  . Physical activity    Days per week: Not on file    Minutes per session: Not on file  . Stress: Not on file  Relationships  . Social Herbalist on phone: Not on file    Gets together: Not on file    Attends religious service: Not on file    Active member of club or organization: Not on file    Attends meetings of clubs or organizations: Not on file    Relationship status: Not on file  . Intimate partner violence    Fear of current or ex partner: Not on file    Emotionally abused: Not on file    Physically abused: Not on file    Forced sexual activity: Not on  file  Other Topics Concern  . Not on file  Social History Narrative  . Not on file    Past Surgical History:  Procedure Laterality Date  . APPENDECTOMY  1966  . BREAST BIOPSY    . CATARACT EXTRACTION  2004,2005  . CHOLECYSTECTOMY  1975  . HERNIA REPAIR  2006  . HIATAL HERNIA REPAIR  1966  . NISSEN FUNDOPLICATION    . TOTAL ABDOMINAL HYSTERECTOMY W/ BILATERAL SALPINGOOPHORECTOMY  1974   hx of cancer     Family History  Problem Relation Age of Onset  . Breast cancer Mother   . Arthritis Mother   . Stroke Mother   . Heart attack Mother   . Heart disease Mother   . Alcohol abuse Brother     Allergies  Allergen Reactions  . Ivp Dye [Iodinated Diagnostic Agents] Nausea And Vomiting    Current Outpatient Medications on File Prior to Visit  Medication Sig Dispense Refill  . Accu-Chek FastClix Lancets MISC USE TO TEST BLOOD GLUCOSE TWICE DAILY 200 each 3  . ACCU-CHEK GUIDE  test strip USE TO TEST BLOOD GLUCOSE TWICE DAILY 200 each 3  . albuterol (VENTOLIN HFA) 108 (90 Base) MCG/ACT inhaler INHALE 2 PUFFS INTO THE LUNGS EVERY 4 (FOUR) HOURS AS NEEDED FOR WHEEZING OR SHORTNESS OF BREATH. 1 Inhaler 5  . apixaban (ELIQUIS) 5 MG TABS tablet Take 1 tablet (5 mg total) by mouth 2 (two) times daily. 180 tablet 1  . budesonide-formoterol (SYMBICORT) 160-4.5 MCG/ACT inhaler Inhale 2 puffs into the lungs 2 (two) times a day. 1 Inhaler 0  . cholecalciferol (VITAMIN D) 1000 units tablet Take 1,000 Units by mouth at bedtime.     Marland Kitchen diltiazem (CARDIZEM CD) 240 MG 24 hr capsule TAKE 1 CAPSULE (240 MG TOTAL) BY MOUTH DAILY. 90 capsule 3  . furosemide (LASIX) 20 MG tablet Take 1 tablet (20 mg total) by mouth 2 (two) times daily. 180 tablet 3  . Glucosamine-Chondroit-Vit C-Mn (GLUCOSAMINE CHONDR 500 COMPLEX PO) Take 1 tablet by mouth daily.     Marland Kitchen ipratropium-albuterol (DUONEB) 0.5-2.5 (3) MG/3ML SOLN Take 3 mLs by nebulization every 4 (four) hours as needed. 360 mL 3  . latanoprost (XALATAN) 0.005 % ophthalmic solution Place 1 drop into both eyes at bedtime.    . metFORMIN (GLUCOPHAGE) 500 MG tablet Take 1 tablet (500 mg total) by mouth 2 (two) times daily with a meal. 180 tablet 0  . metoprolol succinate (TOPROL-XL) 25 MG 24 hr tablet Take 1 tablet (25 mg total) by mouth daily. 90 tablet 3  . mometasone-formoterol (DULERA) 200-5 MCG/ACT AERO Inhale 2 puffs into the lungs 2 (two) times a day. 13 g 3  . Multiple Vitamins-Minerals (PRESERVISION AREDS PO) Take 1 capsule by mouth 2 (two) times daily. Areds Preservision    . omeprazole (PRILOSEC) 20 MG capsule TAKE 1 CAPSULE (20 MG TOTAL) BY MOUTH TWO TIMES DAILY BEFORE A MEAL. 180 capsule 3  . ondansetron (ZOFRAN) 4 MG tablet Take 1 tablet (4 mg total) by mouth every 6 (six) hours as needed for nausea or vomiting. 20 tablet 0  . OXYGEN Inhale 3 L into the lungs.     . potassium chloride (K-DUR) 10 MEQ tablet Take 1 tablet (10 mEq total) by  mouth daily. 90 tablet 2  . Respiratory Therapy Supplies (FLUTTER) DEVI Blow through 4 times per set, 3 sets per day 1 each 0  . diltiazem (CARDIZEM CD) 120 MG 24 hr capsule Take 1 capsule (120 mg  total) by mouth daily as needed (as needed for Atrial Fibrillation). 90 capsule 3   No current facility-administered medications on file prior to visit.     BP 122/64   Temp 97.7 F (36.5 C) (Temporal)   Wt 220 lb (99.8 kg) Comment: SCALE AT HOME  BMI 40.90 kg/m       Objective:   Physical Exam Vitals signs and nursing note reviewed.  Constitutional:      Appearance: Normal appearance.  Pulmonary:     Effort: Pulmonary effort is normal.     Comments: O2 via [employer] Skin:    General: Skin is warm and dry.     Capillary Refill: Capillary refill takes less than 2 seconds.  Neurological:     General: No focal deficit present.     Mental Status: She is alert and oriented to person, place, and time.  Psychiatric:        Mood and Affect: Mood normal.        Behavior: Behavior normal.        Thought Content: Thought content normal.        Judgment: Judgment normal.       Assessment & Plan:  1. Type 2 diabetes mellitus without complication, without long-term current use of insulin (HCC)  - POCT A1C- 5.6  - Cut back to Metformin 500 mg daily  - Monitor BS at home  - Follow up precautions reviewed - Follow up in three months    [name], NP

## 2018-12-31 ENCOUNTER — Encounter: Payer: Self-pay | Admitting: Adult Health

## 2018-12-31 ENCOUNTER — Other Ambulatory Visit: Payer: Self-pay | Admitting: Adult Health

## 2018-12-31 NOTE — Telephone Encounter (Signed)
HCTZ DENIED.  PT NO LONGER TAKING.  POTASSIUM FILLED FOR 1 YEAR.

## 2018-12-31 NOTE — Telephone Encounter (Signed)
Ok to potassium refill x 1 year  I discontinued her HCTZ in September. Pleas see if she has still been taking this medication

## 2019-01-12 NOTE — Progress Notes (Signed)
Virtual Visit via Video Note   This visit type was conducted due to national recommendations for restrictions regarding the COVID-19 Pandemic (e.g. social distancing) in an effort to limit this patient's exposure and mitigate transmission in our community.  Due to her co-morbid illnesses, this patient is at least at moderate risk for complications without adequate follow up.  This format is felt to be most appropriate for this patient at this time.  All issues noted in this document were discussed and addressed.  A limited physical exam was performed with this format.  Please refer to the patient's chart for her consent to telehealth for [facility].   Date:  01/15/2019   ID:  Becky Newton, DOB Dec 10, 1933, MRN [id number]  Patient Location:Home Provider Location: Home  PCP:  [name], NP  Cardiologist:  Dr [name]  Evaluation Performed:  Follow-Up Visit  Chief Complaint:  FU atrial fibrillation  History of Present Illness:    Follow-up atrial fibrillation. Last echocardiogram February 2019 showed ejection fraction 60 to 65% and mild left atrial enlargement. Holter monitor March 2019 showed sinus rhythm with occasional PAC and PVC. Nuclear study April 2019 showed ejection fraction 73% and no ischemia or infarction. Patient also with history of chronic COPD and paralyzed left hemidiaphragm.  Recently seen by pulmonary and Lasix increased for worsening lower extremity edema.  It was noted patient declined CPAP. Since last seenshe has some dyspnea on exertion unchanged.  No orthopnea or PND.  Mild pedal edema.  No chest pain or syncope.  Occasional nosebleed but improved compared to previous.  The patient does not have symptoms concerning for COVID-19 infection (fever, chills, cough, or new shortness of breath).    Past Medical History:  Diagnosis Date  . A-fib ([place])   . Atrial fibrillation ([place])   . Chicken pox   . COPD (chronic obstructive pulmonary disease) ([place])     . Diabetes ([place])   . DNR (do not resuscitate) 2020   [place] MOST on 12/29/2018, advanced care planning documents in chart  . Fibrocystic breast determined by biopsy 1977  . GERD (gastroesophageal reflux disease)   . Glaucoma   . H/O hernia repair 2006  . H/O left breast biopsy 1982  . Incisional hernia   . Lung disease    Paralyzed left hemidiaphragm  . S/P scar revision   . Uterine cancer [facility])    Past Surgical History:  Procedure Laterality Date  . APPENDECTOMY  1966  . BREAST BIOPSY    . CATARACT EXTRACTION  2004,2005  . CHOLECYSTECTOMY  1975  . HERNIA REPAIR  2006  . HIATAL HERNIA REPAIR  1966  . NISSEN FUNDOPLICATION    . TOTAL ABDOMINAL HYSTERECTOMY W/ BILATERAL SALPINGOOPHORECTOMY  1974   hx of cancer      Current Meds  Medication Sig  . Accu-Chek FastClix Lancets MISC USE TO TEST BLOOD GLUCOSE TWICE DAILY  . ACCU-CHEK GUIDE test strip USE TO TEST BLOOD GLUCOSE TWICE DAILY  . albuterol (VENTOLIN HFA) 108 (90 Base) MCG/ACT inhaler INHALE 2 PUFFS INTO THE LUNGS EVERY 4 (FOUR) HOURS AS NEEDED FOR WHEEZING OR SHORTNESS OF BREATH.  Marland Kitchen apixaban (ELIQUIS) 5 MG TABS tablet Take 1 tablet (5 mg total) by mouth 2 (two) times daily.  . cholecalciferol (VITAMIN D) 1000 units tablet Take 1,000 Units by mouth at bedtime.   Marland Kitchen diltiazem (CARDIZEM CD) 120 MG 24 hr capsule Take 1 capsule (120 mg total) by mouth daily as needed (as needed for Atrial Fibrillation).  Marland Kitchen  diltiazem (CARDIZEM CD) 240 MG 24 hr capsule TAKE 1 CAPSULE (240 MG TOTAL) BY MOUTH DAILY.  . furosemide (LASIX) 20 MG tablet Take 1 tablet (20 mg total) by mouth 2 (two) times daily.  . Glucosamine-Chondroit-Vit C-Mn (GLUCOSAMINE CHONDR 500 COMPLEX PO) Take 1 tablet by mouth daily.   Marland Kitchen ipratropium-albuterol (DUONEB) 0.5-2.5 (3) MG/3ML SOLN Take 3 mLs by nebulization every 4 (four) hours as needed.  . latanoprost (XALATAN) 0.005 % ophthalmic solution Place 1 drop into both eyes at bedtime.  . metFORMIN (GLUCOPHAGE) 500 MG tablet  Take 1 tablet (500 mg total) by mouth 2 (two) times daily with a meal. (Patient taking differently: Take 500 mg by mouth daily with breakfast. )  . metoprolol succinate (TOPROL-XL) 25 MG 24 hr tablet Take 1 tablet (25 mg total) by mouth daily.  . mometasone-formoterol (DULERA) 200-5 MCG/ACT AERO Inhale 2 puffs into the lungs 2 (two) times a day.  . Multiple Vitamins-Minerals (PRESERVISION AREDS PO) Take 1 capsule by mouth 2 (two) times daily. Areds Preservision  . omeprazole (PRILOSEC) 20 MG capsule TAKE 1 CAPSULE (20 MG TOTAL) BY MOUTH TWO TIMES DAILY BEFORE A MEAL.  Marland Kitchen ondansetron (ZOFRAN) 4 MG tablet Take 1 tablet (4 mg total) by mouth every 6 (six) hours as needed for nausea or vomiting.  . OXYGEN Inhale 6.5 L into the lungs.   . potassium chloride (K-DUR) 10 MEQ tablet Take 1 tablet (10 mEq total) by mouth daily.  Marland Kitchen Respiratory Therapy Supplies (FLUTTER) DEVI Blow through 4 times per set, 3 sets per day  . [DISCONTINUED] potassium chloride (KLOR-CON) 10 MEQ tablet TAKE 1 TABLET EVERY DAY     Allergies:   Ivp dye [iodinated diagnostic agents]   Social History   Tobacco Use  . Smoking status: Former Smoker    Packs/day: 0.50    Years: 36.00    Pack years: 18.00    Types: Cigarettes    Start date: 70    Quit date: 08/23/1989    Years since quitting: 29.4  . Smokeless tobacco: Never Used  Substance Use Topics  . Alcohol use: No    Alcohol/week: 0.0 standard drinks  . Drug use: No     Family Hx: The patient's family history includes Alcohol abuse in her brother; Arthritis in her mother; Breast cancer in her mother; Heart attack in her mother; Heart disease in her mother; Stroke in her mother.  ROS:   Please see the history of present illness.    No Fever, chills  or productive cough; occasional nosebleed. All other systems reviewed and are negative.   Recent Labs: 09/23/2018: ALT 7; BUN 26; Creatinine, Ser 0.91; Hemoglobin 9.2; Platelets 259.0; Potassium 3.9; Sodium 144; TSH  2.45 10/28/2018: Pro B Natriuretic peptide (BNP) 199.0   Recent Lipid Panel Lab Results  Component Value Date/Time   CHOL 158 09/23/2018 09:55 AM   TRIG 150.0 (H) 09/23/2018 09:55 AM   HDL 43.90 09/23/2018 09:55 AM   CHOLHDL 4 09/23/2018 09:55 AM   LDLCALC 84 09/23/2018 09:55 AM    Wt Readings from Last 3 Encounters:  01/15/19 220 lb (99.8 kg)  12/30/18 220 lb (99.8 kg)  12/29/18 220 lb (99.8 kg)     Objective:    Vital Signs:  BP 120/65   Pulse 71   Ht 5' 1.5" (1.562 m)   Wt 220 lb (99.8 kg)   SpO2 97%   BMI 40.90 kg/m    VITAL SIGNS:  reviewed NAD Answers questions appropriately Normal affect  Remainder of physical examination not performed (telehealth visit; coronavirus pandemic)  ASSESSMENT & PLAN:    1. Paroxysmal atrial fibrillation-based on history patient has not had recurrences.  Continue Cardizem and metoprolol at present dose.  We will continue with apixaban.  She has had difficulties with nosebleeds previously but declined referral to [facility] for [place] placement as she does not want procedures performed outside of [place]. 2. Chronic diastolic congestive heart failure-likely component of right ventricular dysfunction in setting of pulmonary hypertension.  Continue Lasix.  Check potassium and renal function. 3. Chronic home O2 dependent COPD/pulmonary hypertension-patient is followed by pulmonary. 4. History of paralyzed diaphragm 5. Chest pain-no recent symptoms and previous nuclear study negative.  COVID-19 Education: The importance of social distancing was discussed today.  Time:   Today, I have spent 17 minutes with the patient with telehealth technology discussing the above problems.     Medication Adjustments/Labs and Tests Ordered: Current medicines are reviewed at length with the patient today.  Concerns regarding medicines are outlined above.   Tests Ordered: No orders of the defined types were placed in this  encounter.   Medication Changes: No orders of the defined types were placed in this encounter.   Follow Up:  Either In Person or Virtual in 6 month(s)  Signed, [name], MD  01/15/2019 10:26 AM    [place]

## 2019-01-15 ENCOUNTER — Telehealth (INDEPENDENT_AMBULATORY_CARE_PROVIDER_SITE_OTHER): Payer: Medicare HMO | Admitting: Cardiology

## 2019-01-15 VITALS — BP 120/65 | HR 71 | Ht 61.5 in | Wt 220.0 lb

## 2019-01-15 DIAGNOSIS — I110 Hypertensive heart disease with heart failure: Secondary | ICD-10-CM | POA: Diagnosis not present

## 2019-01-15 DIAGNOSIS — I480 Paroxysmal atrial fibrillation: Secondary | ICD-10-CM | POA: Diagnosis not present

## 2019-01-15 DIAGNOSIS — I5032 Chronic diastolic (congestive) heart failure: Secondary | ICD-10-CM | POA: Diagnosis not present

## 2019-01-15 DIAGNOSIS — I10 Essential (primary) hypertension: Secondary | ICD-10-CM

## 2019-01-15 NOTE — Patient Instructions (Signed)
Medication Instructions:  NO CHANGE *If you need a refill on your cardiac medications before your next appointment, please call your pharmacy*  Lab Work: If you have labs (blood work) drawn today and your tests are completely normal, you will receive your results only by: Marland Kitchen MyChart Message (if you have MyChart) OR . A paper copy in the mail If you have any lab test that is abnormal or we need to change your treatment, we will call you to review the results.  Follow-Up: At [facility], you and your health needs are our priority.  As part of our continuing mission to provide you with exceptional heart care, we have created designated Provider Care Teams.  These Care Teams include your primary Cardiologist (physician) and Advanced Practice Providers (APPs -  Physician Assistants and Nurse Practitioners) who all work together to provide you with the care you need, when you need it.  Your next appointment:   6 month(s)  The format for your next appointment:   Either In Person or Virtual  Provider:   You may see [name], MD or one of the following Advanced Practice Providers on your designated Care Team:    Kerin Ransom, PA-C  [place], [place]  Coletta Memos, [place]

## 2019-01-20 DIAGNOSIS — H401133 Primary open-angle glaucoma, bilateral, severe stage: Secondary | ICD-10-CM | POA: Diagnosis not present

## 2019-01-29 DIAGNOSIS — J449 Chronic obstructive pulmonary disease, unspecified: Secondary | ICD-10-CM | POA: Diagnosis not present

## 2019-02-11 ENCOUNTER — Other Ambulatory Visit: Payer: Self-pay | Admitting: Adult Health

## 2019-02-11 DIAGNOSIS — E119 Type 2 diabetes mellitus without complications: Secondary | ICD-10-CM

## 2019-02-12 ENCOUNTER — Ambulatory Visit: Payer: Medicare HMO | Attending: Internal Medicine

## 2019-02-12 DIAGNOSIS — Z23 Encounter for immunization: Secondary | ICD-10-CM

## 2019-02-12 NOTE — Progress Notes (Signed)
   Covid-19 Vaccination Clinic  Name:  Becky Newton    MRN: [id number] DOB: 04-05-1933  02/12/2019  Ms. Crookshanks was observed post Covid-19 immunization for 15 minutes without incidence. She was provided with Vaccine Information Sheet and instruction to access the V-Safe system.   Ms. Montalban was instructed to call 911 with any severe reactions post vaccine: Marland Kitchen Difficulty breathing  . Swelling of your face and throat  . A fast heartbeat  . A bad rash all over your body  . Dizziness and weakness    Immunizations Administered    Name Date Dose VIS Date Route   Pfizer COVID-19 Vaccine 02/12/2019  3:25 PM 0.3 mL 01/02/2019 Intramuscular   Manufacturer: [place]   Lot: [id number]   [place]: [id number]

## 2019-02-26 ENCOUNTER — Telehealth: Payer: Self-pay | Admitting: Critical Care Medicine

## 2019-02-26 NOTE — Telephone Encounter (Signed)
I called pt and advised her that we could sign papers for her to mail to financial assistance. I will advise Mandi to be on the lookout. Nothing further is needed.

## 2019-02-26 NOTE — Telephone Encounter (Signed)
Mandi can you look out for pt's paperwork.

## 2019-02-26 NOTE — Telephone Encounter (Signed)
Pt returned call stating that shge needs to talk to someone regarding the paperwork for [facility]. States that she needs the state license # and MD signature  - she wants to know IF her husband brings the paperwork (he hasn't brought it yet)  CAN he WAIT on the paperwork so they can mail it out immediately and pt not lose her benefits for the medication?  Pt would like ot be called back at [phone] to let her know if her husband can bring the paperwork today and wait on it.

## 2019-02-26 NOTE — Telephone Encounter (Signed)
Husband brought paperwork by to be signed. Dr. Carlis Abbott signed and I gave it back to her husband. Nothing further needed at this time.

## 2019-03-01 DIAGNOSIS — J449 Chronic obstructive pulmonary disease, unspecified: Secondary | ICD-10-CM | POA: Diagnosis not present

## 2019-03-05 ENCOUNTER — Ambulatory Visit: Payer: Medicare HMO | Attending: Internal Medicine

## 2019-03-05 DIAGNOSIS — Z23 Encounter for immunization: Secondary | ICD-10-CM

## 2019-03-05 NOTE — Progress Notes (Signed)
   Covid-19 Vaccination Clinic  Name:  Becky Newton    MRN: [id number] DOB: 1933/04/01  03/05/2019  [name] was observed post Covid-19 immunization for 15 minutes without incidence. She was provided with Vaccine Information Sheet and instruction to access the V-Safe system.   [name] was instructed to call 911 with any severe reactions post vaccine: Marland Kitchen Difficulty breathing  . Swelling of your face and throat  . A fast heartbeat  . A bad rash all over your body  . Dizziness and weakness    Immunizations Administered    Name Date Dose VIS Date Route   Pfizer COVID-19 Vaccine 03/05/2019  4:29 PM 0.3 mL 01/02/2019 Intramuscular   Manufacturer: [place]   Lot: [id number]   [place]: [id number]

## 2019-03-29 DIAGNOSIS — J449 Chronic obstructive pulmonary disease, unspecified: Secondary | ICD-10-CM | POA: Diagnosis not present

## 2019-03-30 ENCOUNTER — Other Ambulatory Visit: Payer: Self-pay

## 2019-03-31 ENCOUNTER — Ambulatory Visit (INDEPENDENT_AMBULATORY_CARE_PROVIDER_SITE_OTHER): Payer: Medicare HMO | Admitting: Adult Health

## 2019-03-31 ENCOUNTER — Encounter: Payer: Self-pay | Admitting: Adult Health

## 2019-03-31 VITALS — BP 118/74 | HR 72 | Temp 97.9°F

## 2019-03-31 DIAGNOSIS — E119 Type 2 diabetes mellitus without complications: Secondary | ICD-10-CM

## 2019-03-31 DIAGNOSIS — M5431 Sciatica, right side: Secondary | ICD-10-CM | POA: Diagnosis not present

## 2019-03-31 LAB — POCT GLYCOSYLATED HEMOGLOBIN (HGB A1C): HbA1c, POC (controlled diabetic range): 6 % (ref 0.0–7.0)

## 2019-03-31 MED ORDER — METHYLPREDNISOLONE 4 MG PO TBPK: ORAL_TABLET | ORAL | 0 refills | Status: DC

## 2019-03-31 NOTE — Progress Notes (Signed)
Subjective:    Patient ID: Becky Newton, female    DOB: [birth year], 86 y.o.   MRN: [id number]  HPI 86 year old female who  has a past medical history of A-fib [facility]), Atrial fibrillation ([place]), Chicken pox, COPD (chronic obstructive pulmonary disease) ([place]), Diabetes ([place]), DNR (do not resuscitate) (2020), Fibrocystic breast determined by biopsy (1977), GERD (gastroesophageal reflux disease), Glaucoma, H/O hernia repair (2006), H/O left breast biopsy (1982), Incisional hernia, Lung disease, S/P scar revision, and Uterine cancer ([place]).  She is in the office today for follow-up regarding diabetes mellitus.  When she was seen 3 months ago her A1c was 5.6 and her Metformin was decreased to 500 mg daily from twice daily dosing.  She reports that her blood sugars are between 100-150 consistently and she has not experienced any hypoglycemic events.   Unfortunately, she continues to have issues with right sided sciatica pain.  Has been present for some time at this point.  At home her husband has been using massage therapy and a TENS unit which has worked somewhat in the past is not making much of a difference currently.  She finds it difficult to get dressed in the morning and out of the house so she would preferably not like to see physical therapy or specialist for this issue.   Review of Systems See HPI   Past Medical History:  Diagnosis Date  . A-fib ([place])   . Atrial fibrillation ([place])   . Chicken pox   . COPD (chronic obstructive pulmonary disease) ([place])   . Diabetes ([place])   . DNR (do not resuscitate) 2020   [place] [facility] on 12/29/2018, advanced care planning documents in chart  . Fibrocystic breast determined by biopsy 1977  . GERD (gastroesophageal reflux disease)   . Glaucoma   . H/O hernia repair 2006  . H/O left breast biopsy 1982  . Incisional hernia   . Lung disease    Paralyzed left hemidiaphragm  . S/P scar revision   . Uterine cancer [facility])     Social History   Socioeconomic  History  . Marital status: Married    Spouse name: Not on file  . Number of children: Not on file  . Years of education: Not on file  . Highest education level: Not on file  Occupational History  . Not on file  Tobacco Use  . Smoking status: Former Smoker    Packs/day: 0.50    Years: 36.00    Pack years: 18.00    Types: Cigarettes    Start date: 66    Quit date: 08/23/1989    Years since quitting: 29.6  . Smokeless tobacco: Never Used  Substance and Sexual Activity  . Alcohol use: No    Alcohol/week: 0.0 standard drinks  . Drug use: No  . Sexual activity: Never    Partners: Male  Other Topics Concern  . Not on file  Social History Narrative  . Not on file   Social Determinants of Health   Financial Resource Strain:   . Difficulty of Paying Living Expenses: Not on file  Food Insecurity:   . Worried About Charity fundraiser in the Last Year: Not on file  . Ran Out of Food in the Last Year: Not on file  Transportation Needs:   . Lack of Transportation (Medical): Not on file  . Lack of Transportation (Non-Medical): Not on file  Physical Activity:   . Days of Exercise per Week: Not on file  .  Minutes of Exercise per Session: Not on file  Stress:   . Feeling of Stress : Not on file  Social Connections:   . Frequency of Communication with Friends and Family: Not on file  . Frequency of Social Gatherings with Friends and Family: Not on file  . Attends Religious Services: Not on file  . Active Member of Clubs or Organizations: Not on file  . Attends Archivist Meetings: Not on file  . Marital Status: Not on file  Intimate Partner Violence:   . Fear of Current or Ex-Partner: Not on file  . Emotionally Abused: Not on file  . Physically Abused: Not on file  . Sexually Abused: Not on file    Past Surgical History:  Procedure Laterality Date  . APPENDECTOMY  1966  . BREAST BIOPSY    . CATARACT EXTRACTION  2004,2005  . CHOLECYSTECTOMY  1975  . HERNIA REPAIR   2006  . HIATAL HERNIA REPAIR  1966  . NISSEN FUNDOPLICATION    . TOTAL ABDOMINAL HYSTERECTOMY W/ BILATERAL SALPINGOOPHORECTOMY  1974   hx of cancer     Family History  Problem Relation Age of Onset  . Breast cancer Mother   . Arthritis Mother   . Stroke Mother   . Heart attack Mother   . Heart disease Mother   . Alcohol abuse Brother     Allergies  Allergen Reactions  . Ivp Dye [Iodinated Diagnostic Agents] Nausea And Vomiting    Current Outpatient Medications on File Prior to Visit  Medication Sig Dispense Refill  . Accu-Chek FastClix Lancets MISC USE TO TEST BLOOD GLUCOSE TWICE DAILY 200 each 3  . ACCU-CHEK GUIDE test strip USE TO TEST BLOOD GLUCOSE TWICE DAILY 200 each 3  . albuterol (VENTOLIN HFA) 108 (90 Base) MCG/ACT inhaler INHALE 2 PUFFS INTO THE LUNGS EVERY 4 (FOUR) HOURS AS NEEDED FOR WHEEZING OR SHORTNESS OF BREATH. 1 Inhaler 5  . apixaban (ELIQUIS) 5 MG TABS tablet Take 1 tablet (5 mg total) by mouth 2 (two) times daily. 180 tablet 1  . cholecalciferol (VITAMIN D) 1000 units tablet Take 1,000 Units by mouth at bedtime.     Marland Kitchen diltiazem (CARDIZEM CD) 240 MG 24 hr capsule TAKE 1 CAPSULE (240 MG TOTAL) BY MOUTH DAILY. 90 capsule 3  . furosemide (LASIX) 20 MG tablet Take 1 tablet (20 mg total) by mouth 2 (two) times daily. 180 tablet 3  . Glucosamine-Chondroit-Vit C-Mn (GLUCOSAMINE CHONDR 500 COMPLEX PO) Take 1 tablet by mouth daily.     Marland Kitchen ipratropium-albuterol (DUONEB) 0.5-2.5 (3) MG/3ML SOLN Take 3 mLs by nebulization every 4 (four) hours as needed. 360 mL 3  . latanoprost (XALATAN) 0.005 % ophthalmic solution Place 1 drop into both eyes at bedtime.    . metFORMIN (GLUCOPHAGE) 500 MG tablet TAKE 1 TABLET (500 MG TOTAL) BY MOUTH 2 (TWO) TIMES DAILY WITH A MEAL. 180 tablet 0  . metoprolol succinate (TOPROL-XL) 25 MG 24 hr tablet Take 1 tablet (25 mg total) by mouth daily. 90 tablet 3  . mometasone-formoterol (DULERA) 200-5 MCG/ACT AERO Inhale 2 puffs into the lungs 2 (two)  times a day. 13 g 3  . Multiple Vitamins-Minerals (PRESERVISION AREDS PO) Take 1 capsule by mouth 2 (two) times daily. [place] Preservision    . omeprazole (PRILOSEC) 20 MG capsule TAKE 1 CAPSULE (20 MG TOTAL) BY MOUTH TWO TIMES DAILY BEFORE A MEAL. 180 capsule 3  . ondansetron (ZOFRAN) 4 MG tablet Take 1 tablet (4 mg total) by  mouth every 6 (six) hours as needed for nausea or vomiting. 20 tablet 0  . OXYGEN Inhale 6.5 L into the lungs.     . potassium chloride (K-DUR) 10 MEQ tablet Take 1 tablet (10 mEq total) by mouth daily. 90 tablet 2  . Respiratory Therapy Supplies (FLUTTER) DEVI Blow through 4 times per set, 3 sets per day 1 each 0  . diltiazem (CARDIZEM CD) 120 MG 24 hr capsule Take 1 capsule (120 mg total) by mouth daily as needed (as needed for Atrial Fibrillation). 90 capsule 3   No current facility-administered medications on file prior to visit.    BP 118/74   Pulse 72   Temp 97.9 F (36.6 C)   SpO2 100%       Objective:   Physical Exam Vitals and nursing note reviewed.  Constitutional:      Appearance: Normal appearance.  Pulmonary:     Effort: Pulmonary effort is normal.     Comments: 6.5 liters via [place] Musculoskeletal:        General: Tenderness: right side sciatic nerve.     Comments: Sitting in motorized wheelchair during exam   Skin:    General: Skin is warm and dry.  Neurological:     General: No focal deficit present.     Mental Status: She is alert and oriented to person, place, and time.  Psychiatric:        Mood and Affect: Mood normal.        Behavior: Behavior normal.        Thought Content: Thought content normal.        Judgment: Judgment normal.       Assessment & Plan:  1. Type 2 diabetes mellitus without complication, without long-term current use of insulin (HCC)  - POCT A1C- 6.0 -We are placing her on a Medrol dose pack we will have her continue with Metformin 500 mg daily, her next refill is due in April.  Once she is done with this  prescription for Metformin I will have her discontinue -Follow-up in 3 months  2. Sciatica of right side -We will trial Medrol Dosepak to help with sciatica pain.  I do think her blood sugars can handle the steroids, she was advised to monitor her blood sugars more closely, can drink more water to help keep her blood sugars low. - methylPREDNISolone (MEDROL DOSEPAK) 4 MG TBPK tablet; Take as directed  Dispense: 21 tablet; Refill: 0 - Follow up if no improvement   [name], NP

## 2019-04-01 ENCOUNTER — Other Ambulatory Visit: Payer: Self-pay

## 2019-04-01 ENCOUNTER — Encounter: Payer: Self-pay | Admitting: Critical Care Medicine

## 2019-04-01 ENCOUNTER — Other Ambulatory Visit: Payer: Self-pay | Admitting: Critical Care Medicine

## 2019-04-01 ENCOUNTER — Ambulatory Visit: Payer: Medicare HMO | Admitting: Critical Care Medicine

## 2019-04-01 VITALS — BP 124/64 | HR 74 | Temp 98.5°F | Ht 61.5 in | Wt 220.0 lb

## 2019-04-01 DIAGNOSIS — R899 Unspecified abnormal finding in specimens from other organs, systems and tissues: Secondary | ICD-10-CM

## 2019-04-01 DIAGNOSIS — J439 Emphysema, unspecified: Secondary | ICD-10-CM | POA: Diagnosis not present

## 2019-04-01 DIAGNOSIS — R0600 Dyspnea, unspecified: Secondary | ICD-10-CM | POA: Diagnosis not present

## 2019-04-01 DIAGNOSIS — R0609 Other forms of dyspnea: Secondary | ICD-10-CM

## 2019-04-01 DIAGNOSIS — I2723 Pulmonary hypertension due to lung diseases and hypoxia: Secondary | ICD-10-CM | POA: Diagnosis not present

## 2019-04-01 DIAGNOSIS — R5381 Other malaise: Secondary | ICD-10-CM | POA: Diagnosis not present

## 2019-04-01 DIAGNOSIS — J9611 Chronic respiratory failure with hypoxia: Secondary | ICD-10-CM

## 2019-04-01 LAB — CBC WITH DIFFERENTIAL/PLATELET
Basophils Absolute: 0.1 10*3/uL (ref 0.0–0.1)
Basophils Relative: 1.1 % (ref 0.0–3.0)
Eosinophils Absolute: 0 10*3/uL (ref 0.0–0.7)
Eosinophils Relative: 0.1 % (ref 0.0–5.0)
HCT: 29 % — ABNORMAL LOW (ref 36.0–46.0)
Hemoglobin: 8.5 g/dL — ABNORMAL LOW (ref 12.0–15.0)
Lymphocytes Relative: 7.9 % — ABNORMAL LOW (ref 12.0–46.0)
Lymphs Abs: 0.8 10*3/uL (ref 0.7–4.0)
MCHC: 29.4 g/dL — ABNORMAL LOW (ref 30.0–36.0)
MCV: 80 fl (ref 78.0–100.0)
Monocytes Absolute: 0.4 10*3/uL (ref 0.1–1.0)
Monocytes Relative: 4.4 % (ref 3.0–12.0)
Neutro Abs: 8.5 10*3/uL — ABNORMAL HIGH (ref 1.4–7.7)
Neutrophils Relative %: 86.5 % — ABNORMAL HIGH (ref 43.0–77.0)
Platelets: 234 10*3/uL (ref 150.0–400.0)
RBC: 3.63 Mil/uL — ABNORMAL LOW (ref 3.87–5.11)
RDW: 17.8 % — ABNORMAL HIGH (ref 11.5–15.5)
WBC: 9.8 10*3/uL (ref 4.0–10.5)

## 2019-04-01 LAB — BASIC METABOLIC PANEL
BUN: 23 mg/dL (ref 6–23)
CO2: 35 mEq/L — ABNORMAL HIGH (ref 19–32)
Calcium: 9.7 mg/dL (ref 8.4–10.5)
Chloride: 100 mEq/L (ref 96–112)
Creatinine, Ser: 0.79 mg/dL (ref 0.40–1.20)
GFR: 69.45 mL/min (ref >60.00)
Glucose, Bld: 174 mg/dL — ABNORMAL HIGH (ref 70–99)
Potassium: 4.6 mEq/L (ref 3.5–5.1)
Sodium: 138 mEq/L (ref 135–145)

## 2019-04-01 LAB — IRON,TIBC AND FERRITIN PANEL
%SAT: 5 % (calc) — ABNORMAL LOW (ref 16–45)
Ferritin: 14 ng/mL — ABNORMAL LOW (ref 16–288)
Iron: 20 ug/dL — ABNORMAL LOW (ref 45–160)
TIBC: 409 mcg/dL (calc) (ref 250–450)

## 2019-04-01 MED ORDER — FERROUS SULFATE 325 (65 FE) MG PO TABS: 325.0000 mg | ORAL_TABLET | Freq: Two times a day (BID) | ORAL | 3 refills | Status: DC

## 2019-04-01 MED ORDER — ALBUTEROL SULFATE HFA 108 (90 BASE) MCG/ACT IN AERS: 2.0000 | INHALATION_SPRAY | Freq: Four times a day (QID) | RESPIRATORY_TRACT | 11 refills | Status: DC | PRN

## 2019-04-01 MED ORDER — POLYETHYLENE GLYCOL 3350 17 G PO PACK: 17.0000 g | PACK | Freq: Every day | ORAL | 3 refills | Status: AC | PRN

## 2019-04-01 NOTE — Progress Notes (Signed)
Please let Becky Newton know that her anemia is worse and she should restart iron supplements, 1 or 2 times daily. We can prescribed miralax in case she needs it for constipation. We should recheck this in 1-2 months, which her PCP can do.

## 2019-04-01 NOTE — Progress Notes (Signed)
Synopsis: Referred in 2016 for COPD by [name], NP.  Previously cared for by Dr. Lake Bells.  Subjective:   PATIENT ID: Becky Newton GENDER: female DOB: [birth year], MRN: [id number]  Chief Complaint  Patient presents with  . Follow-up    f/u Chronic respiratory failure with hypoxia    Becky Newton is an 86 year old woman who presents for follow-up of COPD, chronic hypoxic respiratory failure, WHO group III pulmonary hypertension.  She is accompanied today by her husband.  Since her last visit she continues to feel short of breath almost all the time.  She has no energy.  Her shortness of breath is worsened by any exertion.  Getting dressed requires assistance and is exhausting for her.  Her breathing is also worse when she is attempting to lay down flat.  She has a sleep number bed where she is able to prop herself up, and during the day she sleeps in a chair.  Due to sleeping during the day she tends to toss and turn more at night.  She continues to use Dulera twice daily, and mostly notices coughing with sputum production after using it.  She has not been using her flutter valve and does not think that it benefits her.  She occasionally uses albuterol as needed for shortness of breath.  She is currently using 6 L of supplemental oxygen all the time.  Leg edema- better on higher dose Lasix.  Her ankle significantly decreased in size overnight, but progressively swell throughout the day.  She has TED hose, but they cut into her skin and cause discomfort.  Chronic A. fib-on calcium channel blocker and Eliquis.  Follows with cardiology, last saw Dr. [name] on 01/15/2019-note reviewed.  Chronic restrictive lung disease due to paralyzed hemidiaphragm and likely obesity related restriction.  She has been uninterested in trying CPAP or BiPAP for her OSA and paralyzed hemidiaphragm.  She was placed on a Medrol Dosepak yesterday for sciatica by her PCP.  No exacerbations requiring steroids or  antibiotics since her last visit. She is UTD on flu, pneumonia, covid vaccines.      OV 12/29/2018: Becky Newton is an 86 year old woman with a history of chronic hypoxic respiratory failure and COPD-asthma overlap with who group 3 pulmonary hypertension who presents with her husband for follow-up.  At her last visit she was persistently hypoxic on her portable oxygen concentrator on 3 L, and has since been on 6 L all the time.  She has noticed that that has improved her symptoms.  She has not had an exacerbation recently.  At her last visit her diuretics were increased to twice daily, and she is in the bathroom frequently throughout the day.  She wakes up sometimes at night to go to the bathroom.  She notices that at night her saturations after coming back from the bathroom will be in the upper 80s and take a few minutes to recover to the 90s, but this does not happen during the day. Her husband thinks this is due to mouth breathing. She has an oxygen mask but does not like wearing it due to claustrophobia.  Her oxygen concentrator is up to 6.5 L at night.  She was switched from Symbicort to [employer] maintenance inhaler, and has paperwork for her insurance company for [employer]. Her symptoms are stable. She is up-to-date on flu and pneumonia shots.  She is continued social distancing and mask wearing for Covid precautions.  She mentioned that she has advanced care planning documents in  her chart, but has never signed [employer] form, but is interested in doing this.    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  She has been on oxygen for about 7 years, originally due to restrictive disease related to her left hemidiaphragm paralysis and severe volume loss.  She subsequently underwent a  left hemidiaphragm plication surgery.  Over time her dyspnea has worsened her oxygen requirements have subsequently increased.  At her last visit in 2019 she was using 4.5 L of oxygen with activity and 3 L at rest.  She is maintained on Symbicort for her COPD and asthma overlap.  Cardiology has maintained her on her Lasix regimen.  She has previously been unable to perform pulmonary function studies due to dyspnea.  She quit smoking in 1991 after 27 years of smoking about half a pack per day.  She was diagnosed with OSA when she was living in [place] many years ago, but due to claustrophobia she is not willing to use CPAP.  She would not want to undergo another titrating polysomnogram to try different type of mask.  At home she has been using increasing amounts of oxygen.  Her home concentrator goes to 6 L, and her husband has been keeping it from 5.5 to 6 L all the time.  At rest her saturations are usually around 90, but can drop as low as the 70s when she is walking.  This morning it was about 91% when she was walking.  She severely debilitated, only walking at most 15 to 20 feet.  Her husband is concerned that her debility is making it hard to continue to care for her, and he is motivated to keep her at home, but not sure what he would do when he is unable to meet her needs anymore.  They live in an in-law's suite in his son's home.  She was recently diagnosed with iron deficiency anemia, but has been reluctant to take iron as it causes diarrhea.  He has not attempted to take any medications that would address this symptom.  She remains on apixaban for chronic atrial fibrillation.  She denies bloody stools, hematuria, significant bruising.  Her only complaint is left-sided epistaxis occasionally.  Today her main complaints are fatigue and overall feeling not well.  She has been having trouble sleeping, partially because of shortness of breath.  She sleeps in a chair, because she sleeps poorly at night  she sleeps frequently during the day.  Her inability to lay flat is chronic.  She has frequent nocturia, but also urinates about every 2 hours during the day.  She does not cough frequently, mostly when using her Symbicort or nebulizers.  Her sputum has been baseline-white.  She infrequently misses doses of her Symbicort, and she uses her albuterol frequently.  She complains of shivering frequently, but denies wheezing, fevers, sweats, worsened edema, chest pain, abdominal distention.  Her leg edema is better in the morning, worse throughout the day.  She is up-to-date on seasonal flu and pneumonia vaccines.    Past Medical History:  Diagnosis Date  . A-fib ([place])   . Atrial fibrillation ([place])   . Chicken pox   . COPD (chronic obstructive pulmonary disease) ([place])   . Diabetes ([place])   . DNR (do not resuscitate) 2020   [place] MOST  on 12/29/2018, advanced care planning documents in chart  . Fibrocystic breast determined by biopsy 1977  . GERD (gastroesophageal reflux disease)   . Glaucoma   . H/O hernia repair 2006  . H/O left breast biopsy 1982  . Incisional hernia   . Lung disease    Paralyzed left hemidiaphragm  . S/P scar revision   . Uterine cancer ([place])      Family History  Problem Relation Age of Onset  . Breast cancer Mother   . Arthritis Mother   . Stroke Mother   . Heart attack Mother   . Heart disease Mother   . Alcohol abuse Brother      Past Surgical History:  Procedure Laterality Date  . APPENDECTOMY  1966  . BREAST BIOPSY    . CATARACT EXTRACTION  2004,2005  . CHOLECYSTECTOMY  1975  . HERNIA REPAIR  2006  . HIATAL HERNIA REPAIR  1966  . NISSEN FUNDOPLICATION    . TOTAL ABDOMINAL HYSTERECTOMY W/ BILATERAL SALPINGOOPHORECTOMY  1974   hx of cancer     Social History   Socioeconomic History  . Marital status: Married    Spouse name: Not on file  . Number of children: Not on file  . Years of education: Not on file  . Highest education level: Not on file    Occupational History  . Not on file  Tobacco Use  . Smoking status: Former Smoker    Packs/day: 0.50    Years: 36.00    Pack years: 18.00    Types: Cigarettes    Start date: 64    Quit date: 08/23/1989    Years since quitting: 29.6  . Smokeless tobacco: Never Used  Substance and Sexual Activity  . Alcohol use: No    Alcohol/week: 0.0 standard drinks  . Drug use: No  . Sexual activity: Never    Partners: Male  Other Topics Concern  . Not on file  Social History Narrative  . Not on file   Social Determinants of Health   Financial Resource Strain:   . Difficulty of Paying Living Expenses: Not on file  Food Insecurity:   . Worried About Charity fundraiser in the Last Year: Not on file  . Ran Out of Food in the Last Year: Not on file  Transportation Needs:   . Lack of Transportation (Medical): Not on file  . Lack of Transportation (Non-Medical): Not on file  Physical Activity:   . Days of Exercise per Week: Not on file  . Minutes of Exercise per Session: Not on file  Stress:   . Feeling of Stress : Not on file  Social Connections:   . Frequency of Communication with Friends and Family: Not on file  . Frequency of Social Gatherings with Friends and Family: Not on file  . Attends Religious Services: Not on file  . Active Member of Clubs or Organizations: Not on file  . Attends Archivist Meetings: Not on file  . Marital Status: Not on file  Intimate Partner Violence:   . Fear of Current or Ex-Partner: Not on file  . Emotionally Abused: Not on file  . Physically Abused: Not on file  . Sexually Abused: Not on file     Allergies  Allergen Reactions  . Ivp Dye [Iodinated Diagnostic Agents] Nausea And Vomiting     Immunization History  Administered Date(s) Administered  . Fluad Quad(high Dose 65+) 09/23/2018  . Influenza, High Dose Seasonal PF 09/20/2015, 09/30/2017  .  Influenza,inj,Quad PF,6+ Mos 10/31/2016  . Influenza-Unspecified 10/03/2014  . PFIZER  SARS-COV-2 Vaccination 02/12/2019, 03/05/2019  . Pneumococcal Conjugate-13 10/31/2016  . Pneumococcal Polysaccharide-23 09/23/2018    Outpatient Medications Prior to Visit  Medication Sig Dispense Refill  . Accu-Chek FastClix Lancets MISC USE TO TEST BLOOD GLUCOSE TWICE DAILY 200 each 3  . ACCU-CHEK GUIDE test strip USE TO TEST BLOOD GLUCOSE TWICE DAILY 200 each 3  . albuterol (VENTOLIN HFA) 108 (90 Base) MCG/ACT inhaler INHALE 2 PUFFS INTO THE LUNGS EVERY 4 (FOUR) HOURS AS NEEDED FOR WHEEZING OR SHORTNESS OF BREATH. 1 Inhaler 5  . apixaban (ELIQUIS) 5 MG TABS tablet Take 1 tablet (5 mg total) by mouth 2 (two) times daily. 180 tablet 1  . cholecalciferol (VITAMIN D) 1000 units tablet Take 1,000 Units by mouth at bedtime.     Marland Kitchen diltiazem (CARDIZEM CD) 240 MG 24 hr capsule TAKE 1 CAPSULE (240 MG TOTAL) BY MOUTH DAILY. 90 capsule 3  . furosemide (LASIX) 20 MG tablet Take 1 tablet (20 mg total) by mouth 2 (two) times daily. 180 tablet 3  . Glucosamine-Chondroit-Vit C-Mn (GLUCOSAMINE CHONDR 500 COMPLEX PO) Take 1 tablet by mouth daily.     Marland Kitchen ipratropium-albuterol (DUONEB) 0.5-2.5 (3) MG/3ML SOLN Take 3 mLs by nebulization every 4 (four) hours as needed. 360 mL 3  . latanoprost (XALATAN) 0.005 % ophthalmic solution Place 1 drop into both eyes at bedtime.    . metFORMIN (GLUCOPHAGE) 500 MG tablet TAKE 1 TABLET (500 MG TOTAL) BY MOUTH 2 (TWO) TIMES DAILY WITH A MEAL. (Patient taking differently: Take 500 mg by mouth daily with breakfast. D/C when most current prescription is finished) 180 tablet 0  . methylPREDNISolone (MEDROL DOSEPAK) 4 MG TBPK tablet Take as directed 21 tablet 0  . metoprolol succinate (TOPROL-XL) 25 MG 24 hr tablet Take 1 tablet (25 mg total) by mouth daily. 90 tablet 3  . mometasone-formoterol (DULERA) 200-5 MCG/ACT AERO Inhale 2 puffs into the lungs 2 (two) times a day. 13 g 3  . Multiple Vitamins-Minerals (PRESERVISION AREDS PO) Take 1 capsule by mouth 2 (two) times daily. Areds  Preservision    . omeprazole (PRILOSEC) 20 MG capsule TAKE 1 CAPSULE (20 MG TOTAL) BY MOUTH TWO TIMES DAILY BEFORE A MEAL. 180 capsule 3  . ondansetron (ZOFRAN) 4 MG tablet Take 1 tablet (4 mg total) by mouth every 6 (six) hours as needed for nausea or vomiting. 20 tablet 0  . OXYGEN Inhale 6.5 L into the lungs.     . potassium chloride (K-DUR) 10 MEQ tablet Take 1 tablet (10 mEq total) by mouth daily. 90 tablet 2  . Respiratory Therapy Supplies (FLUTTER) DEVI Blow through 4 times per set, 3 sets per day 1 each 0  . diltiazem (CARDIZEM CD) 120 MG 24 hr capsule Take 1 capsule (120 mg total) by mouth daily as needed (as needed for Atrial Fibrillation). 90 capsule 3   No facility-administered medications prior to visit.    Review of Systems  Constitutional: Positive for chills. Negative for fever and weight loss.       Always cold.  HENT: Positive for nosebleeds. Negative for congestion.   Eyes: Negative for blurred vision and redness.  Respiratory: Positive for shortness of breath. Negative for cough and wheezing.   Cardiovascular: Positive for leg swelling. Negative for chest pain and palpitations.  Gastrointestinal: Negative for constipation, heartburn, nausea and vomiting.       Diarrhea with iron supplements  Genitourinary: Positive for frequency. Negative  for dysuria and hematuria.  Musculoskeletal: Negative for joint pain and myalgias.  Skin: Negative for rash.  Neurological: Negative.   Endo/Heme/Allergies: Negative.   Psychiatric/Behavioral: Negative.      Objective:   Vitals:   04/01/19 1205  BP: 124/64  Pulse: 74  Temp: 98.5 F (36.9 C)  TempSrc: Temporal  SpO2: 99%  Weight: 220 lb (99.8 kg)  Height: 5' 1.5" (1.562 m)   99% on 6 LPM  BMI Readings from Last 3 Encounters:  04/01/19 40.90 kg/m  01/15/19 40.90 kg/m  12/30/18 40.90 kg/m   Wt Readings from Last 3 Encounters:  04/01/19 220 lb (99.8 kg)  01/15/19 220 lb (99.8 kg)  12/30/18 220 lb (99.8 kg)     Physical Exam Vitals reviewed.  Constitutional:      Comments: Fatigued, chronically ill-appearing woman sitting in a wheelchair  HENT:     Head: Normocephalic and atraumatic.  Eyes:     General: No scleral icterus. Cardiovascular:     Rate and Rhythm: Normal rate and regular rhythm.  Pulmonary:     Comments: Mild tachypnea, breathing comfortably on 6 L nasal cannula.  No wheezing. Abdominal:     General: There is no distension.  Musculoskeletal:        General: No deformity.     Cervical back: Neck supple.     Comments: Symmetric lower extremity edema, improved compared to previous exams  Lymphadenopathy:     Cervical: No cervical adenopathy.  Skin:    General: Skin is warm and dry.     Coloration: Skin is pale.     Findings: No rash.  Neurological:     General: No focal deficit present.     Mental Status: She is alert.     Coordination: Coordination normal.  Psychiatric:        Mood and Affect: Mood normal.        Behavior: Behavior normal.     Comments: Cooperative with exam, remains conversational and interactive.      CBC    Component Value Date/Time   WBC 9.3 09/23/2018 0955   RBC 3.73 (L) 09/23/2018 0955   HGB 9.2 (L) 09/23/2018 0955   HCT 30.1 (L) 09/23/2018 0955   PLT 259.0 09/23/2018 0955   MCV 80.5 09/23/2018 0955   MCH 25.6 (L) 07/04/2018 1625   MCHC 30.6 09/23/2018 0955   RDW 17.9 (H) 09/23/2018 0955   LYMPHSABS 2.0 09/23/2018 0955   MONOABS 0.8 09/23/2018 0955   EOSABS 0.3 09/23/2018 0955   BASOSABS 0.1 09/23/2018 0955    CHEMISTRY CMP Latest Ref Rng & Units 09/23/2018 07/04/2018 09/25/2017  Glucose 70 - 99 mg/dL 110(H) 141(H) -  BUN 6 - 23 mg/dL 26(H) 14 -  Creatinine 0.40 - 1.20 mg/dL 0.91 0.86 -  Sodium 135 - 145 mEq/L 144 143 -  Potassium 3.5 - 5.1 mEq/L 3.9 4.0 -  Chloride 96 - 112 mEq/L 97 101 -  CO2 19 - 32 mEq/L 40(H) 30 -  Calcium 8.4 - 10.5 mg/dL 9.9 9.7 -  Total Protein 6.0 - 8.3 g/dL 6.3 6.0(L) 6.1  Total Bilirubin 0.2 - 1.2  mg/dL 0.3 0.4 0.4  Alkaline Phos 39 - 117 U/L 67 - 103  AST 0 - 37 U/L _0 ALT 0 - 35 U/L _1 BNP 199 on 10/28/2018   Chest Imaging- films reviewed: CT chest 08/08/2017- mild emphysema with mosaicism throughout both lungs, atelectasis of medial lobe of right middle lobe,  small opacity in retrocardiac left lower lobe with air bronchograms and atelectasis in the left lower lobe.  Multiple groundglass nodules.  Very generous pulmonary artery.  Patulous esophagus with possible esophageal thickening.  Surgical clips on the posterior left hemidiaphragm-likely previous diaphragm repair.   CXR 10/28/2018-increased interstitial markings bilaterally, not significantly changed from previous.  Left hemidiaphragm silhouetted, cardiomegaly.  Atelectasis in the right base.  Pulmonary Functions Testing Results: No flowsheet data found. Unable to perform.  Echocardiogram 03/08/2017:  LVEF 60 to 65% without regional wall motion abnormalities.  Mildly dilated left atrium.  Normal RV, but mildly dilated right atrium.  PA peak pressure 88 millimeters mercury.  Aortic sclerosis without stenosis, mild AR.  Trivial MR and TR. 2016 echocardiogram: RV systolic pressure 30 - 40 mg mercury, and normal right atrium.  Grade 1 diastolic dysfunction of LV.    Assessment & Plan:     ICD-10-CM   1. Chronic respiratory failure with hypoxia (HCC)  J96.11   2. Pulmonary hypertension due to lung disease (HCC)  I27.23   3. Pulmonary emphysema, unspecified emphysema type ([place])  J43.9   4. Morbid obesity ([place])  E66.01   5. Physical deconditioning  R53.81     Chronic hypoxic respiratory failure on 6 L home oxygen- stable. Multifactorial in etiology- COPD, likely restriction from obesity (cannot confirm without PFTs) and hemidiaphragm paralysis, and WHO group III PH. -Continue 6 L supplemental oxygen.  Her scooter has holders for 2 oxygen tanks thanks to her husband. -Continue Dulera twice daily.  May try DuoNeb and  flutter valve with this to help clear secretions more easily. -We have previously discussed that it would likely be beneficial to use noninvasive positive pressure ventilation at night, but due to claustrophobia she would be unable to tolerate this. -Up-to-date on flu, Covid, and pneumonia shots. -Weight loss as a long-term goal would be beneficial  Pulmonary hypertension- WHO group 3 due to lung disease, functional class IV.  Previously elevated BNP level. -Continue supplemental oxygen at 6 L -Continue current diuretic regimen -Very poor prognosis, which she seems to have good insight into.  She and her husband wanted to be sure that I was able to see her advance care documents from her PCP.  We discussed an [employer] MOST form, and they wanted to fill this out.  She is DNR/DNI, I would only want very limited acute care measures on a trial limited basis.  She would not want ICU admission.  She would only want short-term IV fluids or antibiotics for a reversible condition.  I think given the severity of her chronic medical conditions this is an appropriate approach to her care.  Chronic normocytic anemia, on chronic anticoagulation -CBC and iron studies today -Likely will need to restart enteral iron supplements.  May need to start MiraLAX daily as needed to help with constipation.  COPD/ asthma overlap -Continue Dulera twice daily.  We will need to repeat patient assistance paperwork in a year. -Continue albuterol as needed; suspect that her PH is contributing significantly to her dyspnea, probably more than her COPD.  Debility w/ worsening functional status -Overall poor prognosis  OSA -Again recommended CPAP, which she declined  Goals of care -[employer] MOST form filled out and scanned into the chart on 12/29/2018 visit; Becky Newton and her husband were both present at that time.   RTC in 3 months.    Current Outpatient Medications:  .  Accu-Chek FastClix Lancets MISC, USE TO TEST BLOOD GLUCOSE  TWICE DAILY, Disp:  200 each, Rfl: 3 .  ACCU-CHEK GUIDE test strip, USE TO TEST BLOOD GLUCOSE TWICE DAILY, Disp: 200 each, Rfl: 3 .  albuterol (VENTOLIN HFA) 108 (90 Base) MCG/ACT inhaler, INHALE 2 PUFFS INTO THE LUNGS EVERY 4 (FOUR) HOURS AS NEEDED FOR WHEEZING OR SHORTNESS OF BREATH., Disp: 1 Inhaler, Rfl: 5 .  apixaban (ELIQUIS) 5 MG TABS tablet, Take 1 tablet (5 mg total) by mouth 2 (two) times daily., Disp: 180 tablet, Rfl: 1 .  cholecalciferol (VITAMIN D) 1000 units tablet, Take 1,000 Units by mouth at bedtime. , Disp: , Rfl:  .  diltiazem (CARDIZEM CD) 240 MG 24 hr capsule, TAKE 1 CAPSULE (240 MG TOTAL) BY MOUTH DAILY., Disp: 90 capsule, Rfl: 3 .  furosemide (LASIX) 20 MG tablet, Take 1 tablet (20 mg total) by mouth 2 (two) times daily., Disp: 180 tablet, Rfl: 3 .  Glucosamine-Chondroit-Vit C-Mn (GLUCOSAMINE CHONDR 500 COMPLEX PO), Take 1 tablet by mouth daily. , Disp: , Rfl:  .  ipratropium-albuterol (DUONEB) 0.5-2.5 (3) MG/3ML SOLN, Take 3 mLs by nebulization every 4 (four) hours as needed., Disp: 360 mL, Rfl: 3 .  latanoprost (XALATAN) 0.005 % ophthalmic solution, Place 1 drop into both eyes at bedtime., Disp: , Rfl:  .  metFORMIN (GLUCOPHAGE) 500 MG tablet, TAKE 1 TABLET (500 MG TOTAL) BY MOUTH 2 (TWO) TIMES DAILY WITH A MEAL. (Patient taking differently: Take 500 mg by mouth daily with breakfast. D/C when most current prescription is finished), Disp: 180 tablet, Rfl: 0 .  methylPREDNISolone (MEDROL DOSEPAK) 4 MG TBPK tablet, Take as directed, Disp: 21 tablet, Rfl: 0 .  metoprolol succinate (TOPROL-XL) 25 MG 24 hr tablet, Take 1 tablet (25 mg total) by mouth daily., Disp: 90 tablet, Rfl: 3 .  mometasone-formoterol (DULERA) 200-5 MCG/ACT AERO, Inhale 2 puffs into the lungs 2 (two) times a day., Disp: 13 g, Rfl: 3 .  Multiple Vitamins-Minerals (PRESERVISION AREDS PO), Take 1 capsule by mouth 2 (two) times daily. [employer] Preservision, Disp: , Rfl:  .  omeprazole (PRILOSEC) 20 MG capsule, TAKE 1  CAPSULE (20 MG TOTAL) BY MOUTH TWO TIMES DAILY BEFORE A MEAL., Disp: 180 capsule, Rfl: 3 .  ondansetron (ZOFRAN) 4 MG tablet, Take 1 tablet (4 mg total) by mouth every 6 (six) hours as needed for nausea or vomiting., Disp: 20 tablet, Rfl: 0 .  OXYGEN, Inhale 6.5 L into the lungs. , Disp: , Rfl:  .  potassium chloride (K-DUR) 10 MEQ tablet, Take 1 tablet (10 mEq total) by mouth daily., Disp: 90 tablet, Rfl: 2 .  Respiratory Therapy Supplies (FLUTTER) DEVI, Blow through 4 times per set, 3 sets per day, Disp: 1 each, Rfl: 0 .  diltiazem (CARDIZEM CD) 120 MG 24 hr capsule, Take 1 capsule (120 mg total) by mouth daily as needed (as needed for Atrial Fibrillation)., Disp: 90 capsule, Rfl: 3   Julian Hy, DO [facility] 04/01/2019 12:12 PM

## 2019-04-01 NOTE — Patient Instructions (Addendum)
Thank you for visiting Dr. Carlis Abbott at [facility]. We recommend the following: Orders Placed This Encounter  Procedures  . CBC w/Diff  . Iron, TIBC and Ferritin Panel   Orders Placed This Encounter  Procedures  . CBC w/Diff    Standing Status:   Future    Standing Expiration Date:   03/31/2020  . Iron, TIBC and Ferritin Panel    Standing Status:   Future    Standing Expiration Date:   03/31/2020    Meds ordered this encounter  Medications  . albuterol (VENTOLIN HFA) 108 (90 Base) MCG/ACT inhaler    Sig: Inhale 2 puffs into the lungs every 6 (six) hours as needed for wheezing or shortness of breath.    Dispense:  18 g    Refill:  11    Return in about 3 months (around 07/02/2019).    Please do your part to reduce the spread of COVID-19.

## 2019-04-02 ENCOUNTER — Telehealth: Payer: Self-pay | Admitting: Critical Care Medicine

## 2019-04-02 NOTE — Telephone Encounter (Signed)
    Advised pt of results. Pt understood and nothing further is needed.   Julian Hy, DO  04/01/2019 9:22 PM EST    Please let Becky Newton know that her anemia is worse and she should restart iron supplements, 1 or 2 times daily. We can prescribed miralax in case she needs it for constipation. We should recheck this in 1-2 months, which her PCP can do.      Comments to Patient Seen by patient Becky Newton on 04/02/2019 9:10 AM EST  Your blood counts were lower. I think we should restart iron supplements- either 1 or 2 times daily with meals. If you need miralax, you can take that daily to help avoid constipation.  Written by Julian Hy, DO on 04/01/2019 9:22 PM EST

## 2019-04-14 DIAGNOSIS — Z91041 Radiographic dye allergy status: Secondary | ICD-10-CM | POA: Diagnosis not present

## 2019-04-14 DIAGNOSIS — D649 Anemia, unspecified: Secondary | ICD-10-CM | POA: Diagnosis not present

## 2019-04-14 DIAGNOSIS — Z87891 Personal history of nicotine dependence: Secondary | ICD-10-CM | POA: Diagnosis not present

## 2019-04-14 DIAGNOSIS — E119 Type 2 diabetes mellitus without complications: Secondary | ICD-10-CM | POA: Diagnosis not present

## 2019-04-14 DIAGNOSIS — J449 Chronic obstructive pulmonary disease, unspecified: Secondary | ICD-10-CM | POA: Diagnosis not present

## 2019-04-14 DIAGNOSIS — Z7982 Long term (current) use of aspirin: Secondary | ICD-10-CM | POA: Diagnosis not present

## 2019-04-14 DIAGNOSIS — Z7984 Long term (current) use of oral hypoglycemic drugs: Secondary | ICD-10-CM | POA: Diagnosis not present

## 2019-04-14 DIAGNOSIS — I4891 Unspecified atrial fibrillation: Secondary | ICD-10-CM | POA: Diagnosis not present

## 2019-04-14 DIAGNOSIS — M25551 Pain in right hip: Secondary | ICD-10-CM | POA: Diagnosis not present

## 2019-04-15 ENCOUNTER — Emergency Department (HOSPITAL_COMMUNITY): Payer: Medicare HMO

## 2019-04-15 ENCOUNTER — Other Ambulatory Visit: Payer: Self-pay

## 2019-04-15 ENCOUNTER — Inpatient Hospital Stay (HOSPITAL_COMMUNITY)
Admission: EM | Admit: 2019-04-15 | Discharge: 2019-04-23 | DRG: 189 | Disposition: E | Payer: Medicare HMO | Attending: Internal Medicine | Admitting: Internal Medicine

## 2019-04-15 ENCOUNTER — Encounter (HOSPITAL_COMMUNITY): Payer: Self-pay

## 2019-04-15 DIAGNOSIS — I5033 Acute on chronic diastolic (congestive) heart failure: Secondary | ICD-10-CM | POA: Diagnosis present

## 2019-04-15 DIAGNOSIS — Z20822 Contact with and (suspected) exposure to covid-19: Secondary | ICD-10-CM | POA: Diagnosis present

## 2019-04-15 DIAGNOSIS — W010XXA Fall on same level from slipping, tripping and stumbling without subsequent striking against object, initial encounter: Secondary | ICD-10-CM | POA: Diagnosis present

## 2019-04-15 DIAGNOSIS — Z90710 Acquired absence of both cervix and uterus: Secondary | ICD-10-CM

## 2019-04-15 DIAGNOSIS — S82892D Other fracture of left lower leg, subsequent encounter for closed fracture with routine healing: Secondary | ICD-10-CM | POA: Diagnosis not present

## 2019-04-15 DIAGNOSIS — Y92009 Unspecified place in unspecified non-institutional (private) residence as the place of occurrence of the external cause: Secondary | ICD-10-CM | POA: Diagnosis not present

## 2019-04-15 DIAGNOSIS — J439 Emphysema, unspecified: Secondary | ICD-10-CM | POA: Diagnosis present

## 2019-04-15 DIAGNOSIS — S82899A Other fracture of unspecified lower leg, initial encounter for closed fracture: Secondary | ICD-10-CM

## 2019-04-15 DIAGNOSIS — E875 Hyperkalemia: Secondary | ICD-10-CM | POA: Diagnosis present

## 2019-04-15 DIAGNOSIS — Z87891 Personal history of nicotine dependence: Secondary | ICD-10-CM

## 2019-04-15 DIAGNOSIS — N179 Acute kidney failure, unspecified: Secondary | ICD-10-CM

## 2019-04-15 DIAGNOSIS — R0602 Shortness of breath: Secondary | ICD-10-CM | POA: Diagnosis not present

## 2019-04-15 DIAGNOSIS — I130 Hypertensive heart and chronic kidney disease with heart failure and stage 1 through stage 4 chronic kidney disease, or unspecified chronic kidney disease: Secondary | ICD-10-CM | POA: Diagnosis present

## 2019-04-15 DIAGNOSIS — Z8711 Personal history of peptic ulcer disease: Secondary | ICD-10-CM

## 2019-04-15 DIAGNOSIS — S82842A Displaced bimalleolar fracture of left lower leg, initial encounter for closed fracture: Secondary | ICD-10-CM | POA: Diagnosis present

## 2019-04-15 DIAGNOSIS — J181 Lobar pneumonia, unspecified organism: Secondary | ICD-10-CM

## 2019-04-15 DIAGNOSIS — J690 Pneumonitis due to inhalation of food and vomit: Secondary | ICD-10-CM | POA: Diagnosis present

## 2019-04-15 DIAGNOSIS — J986 Disorders of diaphragm: Secondary | ICD-10-CM | POA: Diagnosis present

## 2019-04-15 DIAGNOSIS — Z823 Family history of stroke: Secondary | ICD-10-CM

## 2019-04-15 DIAGNOSIS — Z7401 Bed confinement status: Secondary | ICD-10-CM

## 2019-04-15 DIAGNOSIS — I2781 Cor pulmonale (chronic): Secondary | ICD-10-CM | POA: Diagnosis present

## 2019-04-15 DIAGNOSIS — I2723 Pulmonary hypertension due to lung diseases and hypoxia: Secondary | ICD-10-CM | POA: Diagnosis present

## 2019-04-15 DIAGNOSIS — W19XXXA Unspecified fall, initial encounter: Secondary | ICD-10-CM | POA: Diagnosis not present

## 2019-04-15 DIAGNOSIS — I959 Hypotension, unspecified: Secondary | ICD-10-CM | POA: Diagnosis not present

## 2019-04-15 DIAGNOSIS — N1831 Chronic kidney disease, stage 3a: Secondary | ICD-10-CM | POA: Diagnosis present

## 2019-04-15 DIAGNOSIS — S82892A Other fracture of left lower leg, initial encounter for closed fracture: Secondary | ICD-10-CM | POA: Diagnosis not present

## 2019-04-15 DIAGNOSIS — Z91041 Radiographic dye allergy status: Secondary | ICD-10-CM

## 2019-04-15 DIAGNOSIS — Z9119 Patient's noncompliance with other medical treatment and regimen: Secondary | ICD-10-CM

## 2019-04-15 DIAGNOSIS — Z79899 Other long term (current) drug therapy: Secondary | ICD-10-CM

## 2019-04-15 DIAGNOSIS — T402X5A Adverse effect of other opioids, initial encounter: Secondary | ICD-10-CM | POA: Diagnosis present

## 2019-04-15 DIAGNOSIS — R0902 Hypoxemia: Secondary | ICD-10-CM | POA: Diagnosis not present

## 2019-04-15 DIAGNOSIS — M5136 Other intervertebral disc degeneration, lumbar region: Secondary | ICD-10-CM | POA: Diagnosis not present

## 2019-04-15 DIAGNOSIS — J9621 Acute and chronic respiratory failure with hypoxia: Secondary | ICD-10-CM | POA: Diagnosis present

## 2019-04-15 DIAGNOSIS — E1122 Type 2 diabetes mellitus with diabetic chronic kidney disease: Secondary | ICD-10-CM | POA: Diagnosis present

## 2019-04-15 DIAGNOSIS — I5032 Chronic diastolic (congestive) heart failure: Secondary | ICD-10-CM | POA: Diagnosis present

## 2019-04-15 DIAGNOSIS — G4733 Obstructive sleep apnea (adult) (pediatric): Secondary | ICD-10-CM | POA: Diagnosis not present

## 2019-04-15 DIAGNOSIS — Z7984 Long term (current) use of oral hypoglycemic drugs: Secondary | ICD-10-CM

## 2019-04-15 DIAGNOSIS — Z803 Family history of malignant neoplasm of breast: Secondary | ICD-10-CM

## 2019-04-15 DIAGNOSIS — M9903 Segmental and somatic dysfunction of lumbar region: Secondary | ICD-10-CM | POA: Diagnosis not present

## 2019-04-15 DIAGNOSIS — S0990XA Unspecified injury of head, initial encounter: Secondary | ICD-10-CM | POA: Diagnosis not present

## 2019-04-15 DIAGNOSIS — D509 Iron deficiency anemia, unspecified: Secondary | ICD-10-CM | POA: Diagnosis present

## 2019-04-15 DIAGNOSIS — R5381 Other malaise: Secondary | ICD-10-CM | POA: Diagnosis present

## 2019-04-15 DIAGNOSIS — Z66 Do not resuscitate: Secondary | ICD-10-CM | POA: Diagnosis present

## 2019-04-15 DIAGNOSIS — Z9889 Other specified postprocedural states: Secondary | ICD-10-CM

## 2019-04-15 DIAGNOSIS — Z7901 Long term (current) use of anticoagulants: Secondary | ICD-10-CM

## 2019-04-15 DIAGNOSIS — R609 Edema, unspecified: Secondary | ICD-10-CM | POA: Diagnosis not present

## 2019-04-15 DIAGNOSIS — Z8249 Family history of ischemic heart disease and other diseases of the circulatory system: Secondary | ICD-10-CM

## 2019-04-15 DIAGNOSIS — S199XXA Unspecified injury of neck, initial encounter: Secondary | ICD-10-CM | POA: Diagnosis not present

## 2019-04-15 DIAGNOSIS — Z9981 Dependence on supplemental oxygen: Secondary | ICD-10-CM

## 2019-04-15 DIAGNOSIS — Z515 Encounter for palliative care: Secondary | ICD-10-CM

## 2019-04-15 DIAGNOSIS — I480 Paroxysmal atrial fibrillation: Secondary | ICD-10-CM | POA: Diagnosis present

## 2019-04-15 DIAGNOSIS — H409 Unspecified glaucoma: Secondary | ICD-10-CM | POA: Diagnosis present

## 2019-04-15 DIAGNOSIS — M351 Other overlap syndromes: Secondary | ICD-10-CM | POA: Diagnosis present

## 2019-04-15 DIAGNOSIS — K219 Gastro-esophageal reflux disease without esophagitis: Secondary | ICD-10-CM | POA: Diagnosis present

## 2019-04-15 DIAGNOSIS — Z7189 Other specified counseling: Secondary | ICD-10-CM | POA: Diagnosis not present

## 2019-04-15 DIAGNOSIS — J9622 Acute and chronic respiratory failure with hypercapnia: Principal | ICD-10-CM

## 2019-04-15 DIAGNOSIS — G9341 Metabolic encephalopathy: Secondary | ICD-10-CM | POA: Diagnosis present

## 2019-04-15 DIAGNOSIS — E662 Morbid (severe) obesity with alveolar hypoventilation: Secondary | ICD-10-CM | POA: Diagnosis present

## 2019-04-15 DIAGNOSIS — I5031 Acute diastolic (congestive) heart failure: Secondary | ICD-10-CM | POA: Diagnosis not present

## 2019-04-15 DIAGNOSIS — E872 Acidosis: Secondary | ICD-10-CM | POA: Diagnosis present

## 2019-04-15 DIAGNOSIS — M9904 Segmental and somatic dysfunction of sacral region: Secondary | ICD-10-CM | POA: Diagnosis not present

## 2019-04-15 DIAGNOSIS — J441 Chronic obstructive pulmonary disease with (acute) exacerbation: Secondary | ICD-10-CM | POA: Diagnosis present

## 2019-04-15 DIAGNOSIS — R531 Weakness: Secondary | ICD-10-CM | POA: Diagnosis not present

## 2019-04-15 DIAGNOSIS — J9692 Respiratory failure, unspecified with hypercapnia: Secondary | ICD-10-CM | POA: Diagnosis present

## 2019-04-15 DIAGNOSIS — J9690 Respiratory failure, unspecified, unspecified whether with hypoxia or hypercapnia: Secondary | ICD-10-CM | POA: Diagnosis not present

## 2019-04-15 DIAGNOSIS — Z8261 Family history of arthritis: Secondary | ICD-10-CM

## 2019-04-15 DIAGNOSIS — M5137 Other intervertebral disc degeneration, lumbosacral region: Secondary | ICD-10-CM | POA: Diagnosis not present

## 2019-04-15 DIAGNOSIS — S82842D Displaced bimalleolar fracture of left lower leg, subsequent encounter for closed fracture with routine healing: Secondary | ICD-10-CM | POA: Diagnosis not present

## 2019-04-15 DIAGNOSIS — Z7951 Long term (current) use of inhaled steroids: Secondary | ICD-10-CM

## 2019-04-15 DIAGNOSIS — Z6841 Body Mass Index (BMI) 40.0 and over, adult: Secondary | ICD-10-CM | POA: Diagnosis not present

## 2019-04-15 DIAGNOSIS — Z90722 Acquired absence of ovaries, bilateral: Secondary | ICD-10-CM

## 2019-04-15 DIAGNOSIS — E119 Type 2 diabetes mellitus without complications: Secondary | ICD-10-CM

## 2019-04-15 DIAGNOSIS — Z9049 Acquired absence of other specified parts of digestive tract: Secondary | ICD-10-CM

## 2019-04-15 DIAGNOSIS — Z8542 Personal history of malignant neoplasm of other parts of uterus: Secondary | ICD-10-CM

## 2019-04-15 DIAGNOSIS — Z8719 Personal history of other diseases of the digestive system: Secondary | ICD-10-CM

## 2019-04-15 LAB — COMPREHENSIVE METABOLIC PANEL
ALT: 12 U/L (ref 0–44)
AST: 12 U/L — ABNORMAL LOW (ref 15–41)
Albumin: 3.2 g/dL — ABNORMAL LOW (ref 3.5–5.0)
Alkaline Phosphatase: 91 U/L (ref 38–126)
Anion gap: 8 (ref 5–15)
BUN: 27 mg/dL — ABNORMAL HIGH (ref 8–23)
CO2: 32 mmol/L (ref 22–32)
Calcium: 9.2 mg/dL (ref 8.9–10.3)
Chloride: 101 mmol/L (ref 98–111)
Creatinine, Ser: 1.07 mg/dL — ABNORMAL HIGH (ref 0.44–1.00)
GFR calc Af Amer: 56 mL/min — ABNORMAL LOW (ref >60)
GFR calc non Af Amer: 48 mL/min — ABNORMAL LOW (ref >60)
Glucose, Bld: 155 mg/dL — ABNORMAL HIGH (ref 70–99)
Potassium: 5.4 mmol/L — ABNORMAL HIGH (ref 3.5–5.1)
Sodium: 141 mmol/L (ref 135–145)
Total Bilirubin: 0.4 mg/dL (ref 0.3–1.2)
Total Protein: 6.5 g/dL (ref 6.5–8.1)

## 2019-04-15 LAB — CBC WITH DIFFERENTIAL/PLATELET
Abs Immature Granulocytes: 0.18 10*3/uL — ABNORMAL HIGH (ref 0.00–0.07)
Basophils Absolute: 0.1 10*3/uL (ref 0.0–0.1)
Basophils Relative: 0 %
Eosinophils Absolute: 0 10*3/uL (ref 0.0–0.5)
Eosinophils Relative: 0 %
HCT: 36.3 % (ref 36.0–46.0)
Hemoglobin: 9.6 g/dL — ABNORMAL LOW (ref 12.0–15.0)
Immature Granulocytes: 1 %
Lymphocytes Relative: 8 %
Lymphs Abs: 1.1 10*3/uL (ref 0.7–4.0)
MCH: 24.2 pg — ABNORMAL LOW (ref 26.0–34.0)
MCHC: 26.4 g/dL — ABNORMAL LOW (ref 30.0–36.0)
MCV: 91.7 fL (ref 80.0–100.0)
Monocytes Absolute: 0.9 10*3/uL (ref 0.1–1.0)
Monocytes Relative: 7 %
Neutro Abs: 11.3 10*3/uL — ABNORMAL HIGH (ref 1.7–7.7)
Neutrophils Relative %: 84 %
Platelets: 279 10*3/uL (ref 150–400)
RBC: 3.96 MIL/uL (ref 3.87–5.11)
RDW: 20.9 % — ABNORMAL HIGH (ref 11.5–15.5)
WBC: 13.6 10*3/uL — ABNORMAL HIGH (ref 4.0–10.5)
nRBC: 0 % (ref 0.0–0.2)

## 2019-04-15 LAB — RESPIRATORY PANEL BY RT PCR (FLU A&B, COVID)
Influenza A by PCR: NEGATIVE
Influenza B by PCR: NEGATIVE
SARS Coronavirus 2 by RT PCR: NEGATIVE

## 2019-04-15 LAB — TROPONIN I (HIGH SENSITIVITY)
Troponin I (High Sensitivity): 14 ng/L (ref <18)
Troponin I (High Sensitivity): 32 ng/L — ABNORMAL HIGH (ref <18)

## 2019-04-15 LAB — BLOOD GAS, ARTERIAL
Acid-Base Excess: 6.4 mmol/L — ABNORMAL HIGH (ref 0.0–2.0)
Bicarbonate: 28.7 mmol/L — ABNORMAL HIGH (ref 20.0–28.0)
Drawn by: 41977
FIO2: 36
O2 Saturation: 85 %
Patient temperature: 37
pCO2 arterial: 85.6 mmHg (ref 32.0–48.0)
pH, Arterial: 7.221 — ABNORMAL LOW (ref 7.350–7.450)
pO2, Arterial: 60.9 mmHg — ABNORMAL LOW (ref 83.0–108.0)

## 2019-04-15 MED ORDER — MORPHINE SULFATE (PF) 2 MG/ML IV SOLN
2.0000 mg | Freq: Once | INTRAVENOUS | Status: AC
  Administered 2019-04-15 (×2): 2 mg via INTRAVENOUS
  Filled 2019-04-15: qty 1

## 2019-04-15 MED ORDER — MORPHINE SULFATE (PF) 2 MG/ML IV SOLN
2.0000 mg | Freq: Once | INTRAVENOUS | Status: AC
  Administered 2019-04-15: 22:00:00 2 mg via INTRAVENOUS
  Filled 2019-04-15: qty 1

## 2019-04-15 MED ORDER — MORPHINE SULFATE (PF) 4 MG/ML IV SOLN
4.0000 mg | Freq: Once | INTRAVENOUS | Status: AC
  Administered 2019-04-15: 22:00:00 4 mg via INTRAVENOUS
  Filled 2019-04-15: qty 1

## 2019-04-15 MED ORDER — MORPHINE SULFATE (PF) 2 MG/ML IV SOLN
INTRAVENOUS | Status: AC
  Filled 2019-04-15: qty 1

## 2019-04-15 MED ORDER — NYSTATIN 100000 UNIT/GM EX POWD
Freq: Two times a day (BID) | CUTANEOUS | Status: DC
  Filled 2019-04-15 (×4): qty 15

## 2019-04-15 MED ORDER — METHYLPREDNISOLONE SODIUM SUCC 125 MG IJ SOLR
125.0000 mg | Freq: Once | INTRAMUSCULAR | Status: AC
  Administered 2019-04-15: 125 mg via INTRAVENOUS
  Filled 2019-04-15: qty 2

## 2019-04-15 MED ORDER — NYSTATIN 100000 UNIT/GM EX POWD
CUTANEOUS | Status: AC
  Filled 2019-04-15: qty 15

## 2019-04-15 NOTE — H&P (Signed)
History and Physical    Patient Demographics:    Becky Newton PTW:[id number] DOB: 1934/01/04 DOA: 03/24/2019  PCP: [name], NP  Patient coming from: Home  I have personally briefly reviewed patient's old medical records in [place]  Chief Complaint: Fall at home   Assessment & Plan:     Assessment/Plan Principal Problem:   Acute on chronic respiratory failure with hypercapnia (HCC) Active Problems:   Type 2 diabetes mellitus without complication ([place])   History of gastric ulcer   History of Nissen fundoplication   Pulmonary hypertension due to lung disease (HCC)   OSA (obstructive sleep apnea)   Paroxysmal atrial fibrillation ([place])   Morbid obesity ([place])   COPD with acute exacerbation ([place])   Physical deconditioning   Ankle fracture     Principal Problem: Acute on chronic hypoxemic and hypercapnic respiratory failure Likely due to combination of COPD, obstructive sleep apnea, partial diaphragmatic paralysis.  Patient does not use CPAP at night.  Is chronically on home O2 at 6 L 24 hours a day.  ABG showed a pH of 7.22, PCO2 85, PO2 60, saturation 95% on room air. -We will place on BiPAP, titrate oxygen as needed -Monitor blood gases  Other Active Problems: Left bimalleolar fracture -Patient had a mechanical fall -Patient is status post closed reduction under sedation in the ER.  Repeat ankle x-ray shows near anatomic alignment of the bimalleolar fracture. -Case discussed with Dr. Doran Durand from orthopedic surgery service in [place] -Transfer to [facility] for orthopedic surgery evaluation  Altered mental status: Patient has somnolence, drowsiness, at the time of my exam.  Received morphine IV in the ER for close reduction of ankle fracture.  Noted to have worsening hypercapnia.  Likely AMS secondary to medications used for ankle reduction as well as underlying worsening respiratory failure. -Monitor neurological signs  Acute exacerbation of  COPD: -Continue Dulera, duo nebs as needed -IV steroids  Obstructive sleep apnea: Patient unable to tolerate CPAP.  On BiPAP due to hypercapnic respiratory failure.  Paroxysmal atrial fibrillation: -Rate is well controlled.  We will continue Cardizem as tolerated with parameters -Eliquis will be placed on hold in anticipation of possible surgical intervention.  Diabetes mellitus type 2: -Hold Metformin -Sliding scale insulin coverage with fingerstick monitoring  Hypertension: -Continue metoprolol  Gastroesophageal reflux disease: -Continue omeprazole  Bilateral middle ear and mastoid opacification: -ENT referral for evaluation as outpatient can be considered  DVT prophylaxis: Lovenox Code Status:  Full code Family Communication: N/A  Disposition Plan: Transfer to [facility] for orthopedic surgery evaluation Consults called: N/A Admission status: Inpatient status    HPI:     HPI: Becky Newton is a 86 y.o. 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  History obtained from the ER record as the patient is somnolent, on BiPAP and unable to provide history.  Patient had a fall around 2 PM when she was try to get out of her lift chair earlier today.  Patient was found to have a left-sided bimalleolar ankle fracture.  Had a closed reduction in the ER.  Case discussed with orthopedic surgery service at [facility] patient is planned for transfer to [facility] for further evaluation by orthopedic surgery service. ED Course:  Vital Signs reviewed on presentation, significant for temperature 98.5, heart rate  101, blood pressure 124/71, saturation 100% on 15 L nasal cannula. Labs reviewed, significant for arterial blood gas shows a pH of 7.22, PCO2 85, PO2  60, saturation 85%.  Sodium 141, potassium 5.4, BUN 27, creatinine 1.07, LFTs within normal limits, troponin 14 with repeat of 32.  WBC count 13.6, hemoglobin 9.6, hematocrit 36, platelets 279, SARS Covid RT-PCR negative. Imaging personally Reviewed, left ankle x-ray shows bimalleolar fracture with severe lateral subluxation of the talus.  CT of the head shows no acute intracranial abnormalities.  Bilateral middle ear and mastoid opacification appears to be inflammatory.  May need ENT follow-up. EKG personally reviewed, shows sinus rhythm, no acute ST-T changes.    Review of systems:    Review of Systems: As per HPI otherwise 10 point review of systems negative.  All other review of systems is negative except the ones noted above in the HPI.    Past Medical and Surgical History:  Reviewed by me  Past Medical History:  Diagnosis Date   A-fib [facility])    Atrial fibrillation ([place])    Chicken pox    COPD (chronic obstructive pulmonary disease) ([place])    Diabetes ([place])    DNR (do not resuscitate) 2020   [place] MOST on 12/29/2018, advanced care planning documents in chart   Fibrocystic breast determined by biopsy 1977   GERD (gastroesophageal reflux disease)    Glaucoma    H/O hernia repair 2006   H/O left breast biopsy 1982   Incisional hernia    Lung disease    Paralyzed left hemidiaphragm   S/P scar revision    Uterine cancer ([place])     Past Surgical History:  Procedure Laterality Date   APPENDECTOMY  1966   BREAST BIOPSY     CATARACT EXTRACTION  2004,2005   CHOLECYSTECTOMY  1975   HERNIA REPAIR  2006   HIATAL HERNIA REPAIR  8315   NISSEN FUNDOPLICATION     TOTAL ABDOMINAL HYSTERECTOMY W/ BILATERAL SALPINGOOPHORECTOMY  1974   hx of cancer      Social History:  Reviewed by me   reports that she quit smoking about 29 years ago. Her smoking use included cigarettes. She started smoking about 66 years ago. She has a 18.00 pack-year smoking history. She has  never used smokeless tobacco. She reports that she does not drink alcohol or use drugs.  Allergies:    Allergies  Allergen Reactions   Ivp Dye [Iodinated Diagnostic Agents] Nausea And Vomiting    Family History :   Family History  Problem Relation Age of Onset   Breast cancer Mother    Arthritis Mother    Stroke Mother    Heart attack Mother    Heart disease Mother    Alcohol abuse Brother    Family history reviewed, noted as above, not pertinent to current presentation.   Home Medications:    Prior to Admission medications   Medication Sig Start Date End Date Taking? Authorizing Provider  albuterol (VENTOLIN HFA) 108 (90 Base) MCG/ACT inhaler Inhale 2 puffs into the lungs every 6 (six) hours as needed for wheezing or shortness of breath. 04/01/19  Yes Julian Hy, DO  apixaban (ELIQUIS) 5 MG TABS tablet Take 1 tablet (5 mg total) by mouth 2 (two) times daily. 12/25/18  Yes Lelon Perla, MD  cholecalciferol (VITAMIN D) 1000 units tablet Take 1,000 Units by mouth at bedtime.    Yes [provider]  diltiazem (CARDIZEM CD) 120 MG 24 hr capsule Take  1 capsule (120 mg total) by mouth daily as needed (as needed for Atrial Fibrillation). 01/14/18 03/24/2019 Yes Lelon Perla, MD  diltiazem (CARDIZEM CD) 240 MG 24 hr capsule TAKE 1 CAPSULE (240 MG TOTAL) BY MOUTH DAILY. 06/20/18  Yes Almyra Deforest, PA  ferrous sulfate (FERROUSUL) 325 (65 FE) MG tablet Take 1 tablet (325 mg total) by mouth 2 (two) times daily with a meal. 04/01/19  Yes Noemi Chapel P, DO  furosemide (LASIX) 20 MG tablet Take 1 tablet (20 mg total) by mouth 2 (two) times daily. 12/15/18  Yes Nafziger, Tommi Rumps, NP  Glucosamine-Chondroit-Vit C-Mn (GLUCOSAMINE CHONDR 500 COMPLEX PO) Take 1 tablet by mouth daily.    Yes [provider]  ipratropium-albuterol (DUONEB) 0.5-2.5 (3) MG/3ML SOLN Take 3 mLs by nebulization every 4 (four) hours as needed. 07/30/17  Yes Young, Clinton D, MD  latanoprost (XALATAN)  0.005 % ophthalmic solution Place 1 drop into both eyes at bedtime.   Yes [provider]  metFORMIN (GLUCOPHAGE) 500 MG tablet TAKE 1 TABLET (500 MG TOTAL) BY MOUTH 2 (TWO) TIMES DAILY WITH A MEAL. Patient taking differently: Take 500 mg by mouth daily with breakfast. D/C when most current prescription is finished 02/12/19  Yes Nafziger, Tommi Rumps, NP  metoprolol succinate (TOPROL-XL) 25 MG 24 hr tablet Take 1 tablet (25 mg total) by mouth daily. 12/15/18  Yes Nafziger, Tommi Rumps, NP  mometasone-formoterol (DULERA) 200-5 MCG/ACT AERO Inhale 2 puffs into the lungs 2 (two) times a day. 08/12/18  Yes Lauraine Rinne, NP  Multiple Vitamins-Minerals (PRESERVISION AREDS PO) Take 1 capsule by mouth 2 (two) times daily. [place] Preservision   Yes [provider]  omeprazole (PRILOSEC) 20 MG capsule TAKE 1 CAPSULE (20 MG TOTAL) BY MOUTH TWO TIMES DAILY BEFORE A MEAL. 09/23/18  Yes Nafziger, Tommi Rumps, NP  ondansetron (ZOFRAN) 4 MG tablet Take 1 tablet (4 mg total) by mouth every 6 (six) hours as needed for nausea or vomiting. 09/09/18  Yes Nafziger, Tommi Rumps, NP  OXYGEN Inhale 6.5 L into the lungs.    Yes [provider]  polyethylene glycol (MIRALAX) 17 g packet Take 17 g by mouth daily as needed (constipation). 04/01/19  Yes Noemi Chapel P, DO  potassium chloride (K-DUR) 10 MEQ tablet Take 1 tablet (10 mEq total) by mouth daily. 01/13/18  Yes Nafziger, Tommi Rumps, NP  methylPREDNISolone (MEDROL DOSEPAK) 4 MG TBPK tablet Take as directed Patient not taking: Reported on 04/17/2019 03/31/19   [name], NP    Physical Exam:    Physical Exam: Vitals:   03/28/2019 1715 03/27/2019 1717 03/31/2019 2100 03/31/2019 2130  BP:  (!) 143/107 115/62 109/78  Pulse:  94    Resp:  20 13 (!) 27  Temp:  98.5 F (36.9 C)    SpO2:  90%    Weight: 97.1 kg     Height: _0  (1.549 m)       Constitutional: Patient somnolent, mild respiratory distress, on BiPAP Vitals:   04/12/2019 1715 04/05/2019 1717 04/19/2019 2100 03/27/2019 2130   BP:  (!) 143/107 115/62 109/78  Pulse:  94    Resp:  20 13 (!) 27  Temp:  98.5 F (36.9 C)    SpO2:  90%    Weight: 97.1 kg     Height: _1  (1.549 m)      Eyes: PERRL, lids and conjunctivae normal ENMT: M patient on BiPAP, unable to do proper oral exam.  Ears look normal.   Neck: normal, supple, no masses,  no thyromegaly Respiratory: Decreased air entry at bases, scattered crepitations, mild wheezing, Cardiovascular: Regular rate and rhythm, no murmurs / rubs / gallops. No extremity edema. 2+ pedal pulses. No carotid bruits.  Abdomen: no tenderness, no masses palpated. No hepatosplenomegaly. Bowel sounds positive.  Musculoskeletal: no clubbing / cyanosis.  Left lower extremity in cast.   Skin: no rashes, lesions, ulcers. No induration Neurologic: Neurological exam is impaired following morphine administration in the ER.  Patient is somnolent, not responsive to verbal or tactile stimuli.  Withdraws to pain.  No obvious focal deficits but exam is certainly limited. Psychiatric: Unable to assess orientation, impaired judgment and insight..    Decubitus Ulcers: Not present on admission Catheters and tubes: None  Data Review:    Labs on Admission: I have personally reviewed following labs and imaging studies  CBC: Recent Labs  Lab 04/10/2019 1853  WBC 13.6*  NEUTROABS 11.3*  HGB 9.6*  HCT 36.3  MCV 91.7  PLT 637   Basic Metabolic Panel: Recent Labs  Lab 04/01/2019 1853  NA 141  K 5.4*  CL 101  CO2 32  GLUCOSE 155*  BUN 27*  CREATININE 1.07*  CALCIUM 9.2   GFR: Estimated Creatinine Clearance: 42.5 mL/min (A) (by C-G formula based on SCr of 1.07 mg/dL (H)). Liver Function Tests: Recent Labs  Lab 04/18/2019 1853  AST 12*  ALT 12  ALKPHOS 91  BILITOT 0.4  PROT 6.5  ALBUMIN 3.2*   No results for input(s): LIPASE, AMYLASE in the last 168 hours. No results for input(s): AMMONIA in the last 168 hours. Coagulation Profile: No results for input(s): INR, PROTIME in  the last 168 hours. Cardiac Enzymes: No results for input(s): CKTOTAL, CKMB, CKMBINDEX, TROPONINI in the last 168 hours. BNP (last 3 results) Recent Labs    10/28/18 1219  PROBNP 199.0*   HbA1C: No results for input(s): HGBA1C in the last 72 hours. CBG: No results for input(s): GLUCAP in the last 168 hours. Lipid Profile: No results for input(s): CHOL, HDL, LDLCALC, TRIG, CHOLHDL, LDLDIRECT in the last 72 hours. Thyroid Function Tests: No results for input(s): TSH, T4TOTAL, FREET4, T3FREE, THYROIDAB in the last 72 hours. Anemia Panel: No results for input(s): VITAMINB12, FOLATE, FERRITIN, TIBC, IRON, RETICCTPCT in the last 72 hours. Urine analysis:    Component Value Date/Time   COLORURINE STRAW (A) 03/08/2017 0641   APPEARANCEUR CLEAR 03/08/2017 0641   LABSPEC 1.005 03/08/2017 0641   PHURINE 6.0 03/08/2017 0641   GLUCOSEU NEGATIVE 03/08/2017 0641   HGBUR NEGATIVE 03/08/2017 0641   [place] NEGATIVE 03/08/2017 0641   [place] 03/08/2017 0641   PROTEINUR NEGATIVE 03/08/2017 0641   NITRITE NEGATIVE 03/08/2017 0641   LEUKOCYTESUR NEGATIVE 03/08/2017 0641     Imaging Results:      Radiological Exams on Admission: DG Ankle 2 Views Left  Result Date: 04/09/2019 CLINICAL DATA:  Bimalleolar ankle fracture, reduction EXAM: LEFT ANKLE - 2 VIEW COMPARISON:  04/06/2019 FINDINGS: Frontal and lateral views of the left ankle demonstrate reduction of the bimalleolar ankle fracture seen previously, with near anatomic alignment of the medial and lateral malleoli. The talus is now aligned anatomic [place] with the tibial plafond. Diffuse soft tissue edema. IMPRESSION: 1. Near anatomic alignment of the bimalleolar fracture seen previously. 2. Anatomic alignment of the tibiotalar joint. Electronically Signed   By: Randa Ngo M.D.   On: 04/02/2019 23:11   DG Ankle Complete Left  Result Date: 04/18/2019 CLINICAL DATA:  [place], pain and bruising, visible deformity EXAM: LEFT  ANKLE  COMPLETE - 3+ VIEW COMPARISON:  None. FINDINGS: Frontal and cross-table lateral views of the left ankle are obtained. Mildly comminuted oblique fracture of the distal fibular diaphysis is noted. There is a comminuted transverse fracture of the medial malleolus. There is lateral displacement of the medial and lateral malleolar fracture fragments, with severe lateral subluxation of the talus relative to the tibial plafond. There is diffuse soft tissue edema. IMPRESSION: 1. Bimalleolar fracture with severe lateral subluxation of the talus. Electronically Signed   By: Randa Ngo M.D.   On: 04/07/2019 18:54   CT Head Wo Contrast  Result Date: 04/04/2019 CLINICAL DATA:  86 year old female status post syncope and fall. EXAM: CT HEAD WITHOUT CONTRAST TECHNIQUE: Contiguous axial images were obtained from the base of the skull through the vertex without intravenous contrast. COMPARISON:  None. FINDINGS: Brain: Cerebral volume is within normal limits for age. No midline shift, ventriculomegaly, mass effect, evidence of mass lesion, intracranial hemorrhage or evidence of cortically based acute infarction. Possible small focus of subependymal gray matter heterotopia at the left lateral ventricle on series 2, image 15. Elsewhere gray-white matter differentiation appears normal for age. Vascular: Calcified atherosclerosis at the skull base. No suspicious intracranial vascular hyperdensity. Skull: Negative, no skull fracture identified. Sinuses/Orbits: Bilateral mastoid effusions and middle ear opacification, appears to be inflammatory. Negative visible nasopharynx. Visible paranasal sinuses are clear. Other: No acute orbit or scalp soft tissue injury identified. IMPRESSION: 1. No acute intracranial abnormality or acute traumatic injury identified. 2. Bilateral middle ear and mastoid opacification appears to be inflammatory. Consider otitis media or cholesteatoma. ENT follow-up may be valuable. Electronically Signed   By: Genevie Ann M.D.   On: 03/25/2019 21:42   CT Cervical Spine Wo Contrast  Result Date: 03/24/2019 CLINICAL DATA:  86 year old female status post syncope and fall. EXAM: CT CERVICAL SPINE WITHOUT CONTRAST TECHNIQUE: Multidetector CT imaging of the cervical spine was performed without intravenous contrast. Multiplanar CT image reconstructions were also generated. COMPARISON:  Head CT today reported separately. Chest CT 08/08/2017. FINDINGS: Alignment: Mild reversal of cervical lordosis. Subtle anterolisthesis at C3-C4 and C4-C5 appears to be degenerative in nature with associated facet arthropathy. Cervicothoracic junction alignment is within normal limits. Bilateral posterior element alignment is within normal limits. Skull base and vertebrae: Visualized skull base is intact. No atlanto-occipital dissociation. No acute osseous abnormality identified. Mild spina bifida occulta at C6 (normal variant). Soft tissues and spinal canal: No prevertebral fluid or swelling. No visible canal hematoma. Negative noncontrast neck soft tissues, partially retropharyngeal carotids. Disc levels: Widespread bilateral cervical facet arthropathy. Comparatively mild chronic disc and endplate degeneration. No significant spinal stenosis suspected. Upper chest: Visible upper thoracic levels appear intact. There is confluent new lung opacity in the left apex on series 4, image 72 when compared to the 2019 chest CT. This does resemble atelectasis. Negative visible superior mediastinum. IMPRESSION: 1. No acute traumatic injury identified in the cervical spine. 2. New nonspecific left apical lung opacity (series 4, image 72) when compared to a 2019 Chest CT. Query cough. Consider a repeat Chest CT (noncontrast may suffice) in 2-3 months to re-evaluate. Electronically Signed   By: Genevie Ann M.D.   On: 04/14/2019 21:48   DG Chest Portable 1 View  Result Date: 03/31/2019 CLINICAL DATA:  Shortness of breath, left ankle fracture EXAM: PORTABLE CHEST  1 VIEW COMPARISON:  10/28/2018 FINDINGS: Single frontal view of the chest demonstrates persistent enlargement of the cardiac silhouette. There is chronic central vascular congestion, with  chronic left basilar consolidation unchanged. Small bilateral pleural effusions versus pleural thickening, left greater than right, again noted. No pneumothorax. No acute bony abnormality. IMPRESSION: 1. Chronic vascular congestion. 2. Chronic left basilar consolidation compatible with atelectasis or scarring. 3. Stable small bilateral pleural effusions versus pleural thickening. Electronically Signed   By: Randa Ngo M.D.   On: 04/03/2019 23:12      [facility] Ginette Otto MD [facility]  If 7PM-7AM, please contact night-coverage   04/08/2019, 11:36 PM

## 2019-04-15 NOTE — ED Notes (Signed)
Patient placed on BiPAP by respiratory.

## 2019-04-15 NOTE — ED Triage Notes (Signed)
Pt reports she was getting up from her chair and was in a standing position and fell on left side. Deformity to left ankle noted.

## 2019-04-15 NOTE — ED Notes (Signed)
Date and time results received: 03/23/2019 2155 (use smartphrase ".now" to insert current time)  Test: co2 85.6 Critical Value:    Name of Provider Notified: Walker Shadow, [place]  Orders Received? Or Actions Taken?: Actions Taken: no orders received

## 2019-04-15 NOTE — ED Provider Notes (Signed)
[facility] EMERGENCY DEPARTMENT Provider Note   CSN: [id number] Arrival date & time: 03/24/2019  1708     History Chief Complaint  Patient presents with   Ankle Pain    Becky Newton is a 86 y.o. female.  HPI 86 year old female with extensive medical history including COPD on 6L of home O2, CHF, A. fib with RVR on Eliquis resents to the ER after a fall which happened around 2 PM.  History provided by patient, no family at bedside. The patient states that she was trying to get out of her lift chair earlier today when she tripped and fell backwards.  She states she has heart failure did not take her "water pill" this morning. Reports taking recently prescribed oxycodone and Robaxin. Denies hitting her head or LOC.  No dizziness, vision changes, nausea, chest pain, radiation, vomiting, syncope at the time of her fall or currently.  Patient states that she is on 6 L of oxygen at home.    Past Medical History:  Diagnosis Date   A-fib [facility])    Atrial fibrillation ([place])    Chicken pox    COPD (chronic obstructive pulmonary disease) ([place])    Diabetes ([place])    DNR (do not resuscitate) 2020   [place] MOST on 12/29/2018, advanced care planning documents in chart   Fibrocystic breast determined by biopsy 1977   GERD (gastroesophageal reflux disease)    Glaucoma    H/O hernia repair 2006   H/O left breast biopsy 1982   Incisional hernia    Lung disease    Paralyzed left hemidiaphragm   S/P scar revision    Uterine cancer [facility]     Patient Active Problem List   Diagnosis Date Noted   Physical deconditioning 09/03/2018   Medication management 07/04/2018   Pulmonary emphysema ([place]) 07/04/2018   SOB (shortness of breath) 06/02/2018   S/p bilateral blepharoplasty 05/08/2017   Atrial fibrillation with RVR ([place]) 03/09/2017   Chronic diastolic CHF (congestive heart failure) ([place]) 03/09/2017   Palpitation 03/09/2017   COPD with acute exacerbation ([place]) 08/22/2016    Epistaxis 03/05/2016   Hematoma of leg, right, initial encounter 11/29/2015   Ischemic optic neuropathy of right eye 11/29/2015   Vitreous degeneration, bilateral 11/29/2015   Overactive bladder 08/19/2015   Lower extremity edema 08/19/2015   Stasis dermatitis 08/19/2015   Paroxysmal atrial fibrillation ([place]) 11/03/2014   Chest pain 11/03/2014   Morbid obesity ([place]) 11/03/2014   Chronic respiratory failure ([place]) 09/02/2014   OSA (obstructive sleep apnea) 09/02/2014   Hypercalcemia 09/02/2014   Type 2 diabetes mellitus without complication ([place]) 98/92/1194   History of gastric ulcer 08/24/2014   Paralysis, diaphragm 08/24/2014   History of Nissen fundoplication 17/40/8144   Pulmonary hypertension due to lung disease ([place]) 08/24/2014   Open-angle glaucoma 08/24/2014    Past Surgical History:  Procedure Laterality Date   APPENDECTOMY  1966   BREAST BIOPSY     CATARACT EXTRACTION  2004,2005   CHOLECYSTECTOMY  1975   HERNIA REPAIR  2006   HIATAL HERNIA REPAIR  8185   NISSEN FUNDOPLICATION     TOTAL ABDOMINAL HYSTERECTOMY W/ BILATERAL SALPINGOOPHORECTOMY  1974   hx of cancer      OB History   No obstetric history on file.     Family History  Problem Relation Age of Onset   Breast cancer Mother    Arthritis Mother    Stroke Mother    Heart attack Mother    Heart disease Mother  Alcohol abuse Brother     Social History   Tobacco Use   Smoking status: Former Smoker    Packs/day: 0.50    Years: 36.00    Pack years: 18.00    Types: Cigarettes    Start date: 1955    Quit date: 08/23/1989    Years since quitting: 29.6   Smokeless tobacco: Never Used  Substance Use Topics   Alcohol use: No    Alcohol/week: 0.0 standard drinks   Drug use: No    Home Medications Prior to Admission medications   Medication Sig Start Date End Date Taking? Authorizing Provider  albuterol (VENTOLIN HFA) 108 (90 Base) MCG/ACT inhaler Inhale 2 puffs  into the lungs every 6 (six) hours as needed for wheezing or shortness of breath. 04/01/19  Yes Julian Hy, DO  apixaban (ELIQUIS) 5 MG TABS tablet Take 1 tablet (5 mg total) by mouth 2 (two) times daily. 12/25/18  Yes Lelon Perla, MD  cholecalciferol (VITAMIN D) 1000 units tablet Take 1,000 Units by mouth at bedtime.    Yes [provider]  diltiazem (CARDIZEM CD) 120 MG 24 hr capsule Take 1 capsule (120 mg total) by mouth daily as needed (as needed for Atrial Fibrillation). 01/14/18 04/10/2019 Yes Lelon Perla, MD  diltiazem (CARDIZEM CD) 240 MG 24 hr capsule TAKE 1 CAPSULE (240 MG TOTAL) BY MOUTH DAILY. 06/20/18  Yes Almyra Deforest, PA  ferrous sulfate (FERROUSUL) 325 (65 FE) MG tablet Take 1 tablet (325 mg total) by mouth 2 (two) times daily with a meal. 04/01/19  Yes Noemi Chapel P, DO  furosemide (LASIX) 20 MG tablet Take 1 tablet (20 mg total) by mouth 2 (two) times daily. 12/15/18  Yes Nafziger, Tommi Rumps, NP  Glucosamine-Chondroit-Vit C-Mn (GLUCOSAMINE CHONDR 500 COMPLEX PO) Take 1 tablet by mouth daily.    Yes [provider]  ipratropium-albuterol (DUONEB) 0.5-2.5 (3) MG/3ML SOLN Take 3 mLs by nebulization every 4 (four) hours as needed. 07/30/17  Yes Young, Clinton D, MD  latanoprost (XALATAN) 0.005 % ophthalmic solution Place 1 drop into both eyes at bedtime.   Yes [provider]  metFORMIN (GLUCOPHAGE) 500 MG tablet TAKE 1 TABLET (500 MG TOTAL) BY MOUTH 2 (TWO) TIMES DAILY WITH A MEAL. Patient taking differently: Take 500 mg by mouth daily with breakfast. D/C when most current prescription is finished 02/12/19  Yes Nafziger, Tommi Rumps, NP  metoprolol succinate (TOPROL-XL) 25 MG 24 hr tablet Take 1 tablet (25 mg total) by mouth daily. 12/15/18  Yes Nafziger, Tommi Rumps, NP  mometasone-formoterol (DULERA) 200-5 MCG/ACT AERO Inhale 2 puffs into the lungs 2 (two) times a day. 08/12/18  Yes Lauraine Rinne, NP  Multiple Vitamins-Minerals (PRESERVISION AREDS PO) Take 1 capsule by mouth  2 (two) times daily. [place] Preservision   Yes [provider]  omeprazole (PRILOSEC) 20 MG capsule TAKE 1 CAPSULE (20 MG TOTAL) BY MOUTH TWO TIMES DAILY BEFORE A MEAL. 09/23/18  Yes Nafziger, Tommi Rumps, NP  ondansetron (ZOFRAN) 4 MG tablet Take 1 tablet (4 mg total) by mouth every 6 (six) hours as needed for nausea or vomiting. 09/09/18  Yes Nafziger, Tommi Rumps, NP  OXYGEN Inhale 6.5 L into the lungs.    Yes [provider]  polyethylene glycol (MIRALAX) 17 g packet Take 17 g by mouth daily as needed (constipation). 04/01/19  Yes Noemi Chapel P, DO  potassium chloride (K-DUR) 10 MEQ tablet Take 1 tablet (10 mEq total) by mouth daily. 01/13/18  Yes [name], NP  methylPREDNISolone (MEDROL DOSEPAK) 4 MG TBPK tablet Take as directed Patient not taking: Reported on 04/14/2019 03/31/19   [name], NP    Allergies    Ivp dye [iodinated diagnostic agents]  Review of Systems   Review of Systems  Constitutional: Negative for chills, fatigue and fever.  HENT: Negative for ear pain and sore throat.   Eyes: Negative for pain and visual disturbance.  Respiratory: Negative for cough and shortness of breath.   Cardiovascular: Positive for leg swelling. Negative for chest pain and palpitations.  Gastrointestinal: Negative for abdominal pain, nausea and vomiting.  Genitourinary: Negative for dysuria and hematuria.  Musculoskeletal: Negative for arthralgias and back pain.  Skin: Negative for color change and rash.  Neurological: Positive for tremors and weakness. Negative for dizziness, seizures, syncope, facial asymmetry, speech difficulty, light-headedness, numbness and headaches.  Psychiatric/Behavioral: Positive for confusion. The patient is not nervous/anxious.   All other systems reviewed and are negative.   Physical Exam Updated Vital Signs BP (!) 143/107 (BP Location: Left Wrist)    Pulse 94    Temp 98.5 F (36.9 C)    Resp 20    Ht _0  (1.549 m)    Wt 97.1 kg    SpO2 90%    BMI  40.43 kg/m   Physical Exam HENT:     Head: Normocephalic and atraumatic.  Eyes:     Extraocular Movements: Extraocular movements intact.     Conjunctiva/sclera: Conjunctivae normal.     Pupils: Pupils are equal, round, and reactive to light.  Cardiovascular:     Rate and Rhythm: Normal rate and regular rhythm.     Comments: Pulses intact bilaterally.  2+ pitting edema in lower extremities bilaterally. Pulmonary:     Effort: Pulmonary effort is normal. No respiratory distress.     Breath sounds: Wheezing present.     Comments: Breath sounds decreased bilaterally. Abdominal:     General: Abdomen is flat.     Palpations: Abdomen is soft.  Musculoskeletal:        General: Swelling and deformity present.     Cervical back: Normal range of motion. No rigidity or tenderness.     Right lower leg: Edema present.     Left lower leg: Edema present.     Comments: Left ankle significantly everted.  No open fracture appreciated on physical exam.  Significant bilateral lower extremity edema.  Mild to moderate bruising around ankle.  Skin:    General: Skin is warm and dry.     Capillary Refill: Capillary refill takes 2 to 3 seconds.     Findings: Bruising present.  Neurological:     General: No focal deficit present.     Mental Status: She is oriented to person, place, and time.     Cranial Nerves: No cranial nerve deficit.     Sensory: No sensory deficit.     Motor: No weakness.     Coordination: Romberg sign negative. Coordination normal.     Comments: No noticeable focal neuro deficits.  Negative Romberg, pronator drift.  No slurred speech.  Patient's train of thought waxes and wanes, unsure if that is at baseline.  Cannot test heel-to-shin due to patient's ankle fracture.  Unable to ambulate patient.  Psychiatric:        Mood and Affect: Mood normal.        Behavior: Behavior normal.     ED Results / Procedures / Treatments   Labs (all labs ordered are listed, but only abnormal  results are displayed) Labs Reviewed  CBC WITH DIFFERENTIAL/PLATELET - Abnormal; Notable for the following components:      Result Value   WBC 13.6 (*)    Hemoglobin 9.6 (*)    MCH 24.2 (*)    MCHC 26.4 (*)    RDW 20.9 (*)    Neutro Abs 11.3 (*)    Abs Immature Granulocytes 0.18 (*)    All other components within normal limits  COMPREHENSIVE METABOLIC PANEL - Abnormal; Notable for the following components:   Potassium 5.4 (*)    Glucose, Bld 155 (*)    BUN 27 (*)    Creatinine, Ser 1.07 (*)    Albumin 3.2 (*)    AST 12 (*)    GFR calc non Af Amer 48 (*)    GFR calc Af Amer 56 (*)    All other components within normal limits  BLOOD GAS, ARTERIAL - Abnormal; Notable for the following components:   pH, Arterial 7.221 (*)    pCO2 arterial 85.6 (*)    pO2, Arterial 60.9 (*)    Bicarbonate 28.7 (*)    Acid-Base Excess 6.4 (*)    All other components within normal limits  TROPONIN I (HIGH SENSITIVITY) - Abnormal; Notable for the following components:   Troponin I (High Sensitivity) 32 (*)    All other components within normal limits  SARS CORONAVIRUS 2 (TAT 6-24 HRS)  RESPIRATORY PANEL BY RT PCR (FLU A&B, COVID)  URINALYSIS, ROUTINE W REFLEX MICROSCOPIC  TROPONIN I (HIGH SENSITIVITY)    EKG None  Radiology DG Ankle Complete Left  Result Date: 04/17/2019 CLINICAL DATA:  [place], pain and bruising, visible deformity EXAM: LEFT ANKLE COMPLETE - 3+ VIEW COMPARISON:  None. FINDINGS: Frontal and cross-table lateral views of the left ankle are obtained. Mildly comminuted oblique fracture of the distal fibular diaphysis is noted. There is a comminuted transverse fracture of the medial malleolus. There is lateral displacement of the medial and lateral malleolar fracture fragments, with severe lateral subluxation of the talus relative to the tibial plafond. There is diffuse soft tissue edema. IMPRESSION: 1. Bimalleolar fracture with severe lateral subluxation of the talus. Electronically  Signed   By: Randa Ngo M.D.   On: 03/26/2019 18:54   CT Head Wo Contrast  Result Date: 04/19/2019 CLINICAL DATA:  86 year old female status post syncope and fall. EXAM: CT HEAD WITHOUT CONTRAST TECHNIQUE: Contiguous axial images were obtained from the base of the skull through the vertex without intravenous contrast. COMPARISON:  None. FINDINGS: Brain: Cerebral volume is within normal limits for age. No midline shift, ventriculomegaly, mass effect, evidence of mass lesion, intracranial hemorrhage or evidence of cortically based acute infarction. Possible small focus of subependymal gray matter heterotopia at the left lateral ventricle on series 2, image 15. Elsewhere gray-white matter differentiation appears normal for age. Vascular: Calcified atherosclerosis at the skull base. No suspicious intracranial vascular hyperdensity. Skull: Negative, no skull fracture identified. Sinuses/Orbits: Bilateral mastoid effusions and middle ear opacification, appears to be inflammatory. Negative visible nasopharynx. Visible paranasal sinuses are clear. Other: No acute orbit or scalp soft tissue injury identified. IMPRESSION: 1. No acute intracranial abnormality or acute traumatic injury identified. 2. Bilateral middle ear and mastoid opacification appears to be inflammatory. Consider otitis media or cholesteatoma. ENT follow-up may be valuable. Electronically Signed   By: Genevie Ann M.D.   On: 04/04/2019 21:42   CT Cervical Spine Wo Contrast  Result Date: 04/17/2019 CLINICAL DATA:  86 year old female status post syncope and fall.  EXAM: CT CERVICAL SPINE WITHOUT CONTRAST TECHNIQUE: Multidetector CT imaging of the cervical spine was performed without intravenous contrast. Multiplanar CT image reconstructions were also generated. COMPARISON:  Head CT today reported separately. Chest CT 08/08/2017. FINDINGS: Alignment: Mild reversal of cervical lordosis. Subtle anterolisthesis at C3-C4 and C4-C5 appears to be degenerative  in nature with associated facet arthropathy. Cervicothoracic junction alignment is within normal limits. Bilateral posterior element alignment is within normal limits. Skull base and vertebrae: Visualized skull base is intact. No atlanto-occipital dissociation. No acute osseous abnormality identified. Mild spina bifida occulta at C6 (normal variant). Soft tissues and spinal canal: No prevertebral fluid or swelling. No visible canal hematoma. Negative noncontrast neck soft tissues, partially retropharyngeal carotids. Disc levels: Widespread bilateral cervical facet arthropathy. Comparatively mild chronic disc and endplate degeneration. No significant spinal stenosis suspected. Upper chest: Visible upper thoracic levels appear intact. There is confluent new lung opacity in the left apex on series 4, image 72 when compared to the 2019 chest CT. This does resemble atelectasis. Negative visible superior mediastinum. IMPRESSION: 1. No acute traumatic injury identified in the cervical spine. 2. New nonspecific left apical lung opacity (series 4, image 72) when compared to a 2019 Chest CT. Query cough. Consider a repeat Chest CT (noncontrast may suffice) in 2-3 months to re-evaluate. Electronically Signed   By: Genevie Ann M.D.   On: 03/31/2019 21:48    Procedures Reduction of dislocation  Date/Time: 04/10/2019 10:30 PM Performed by: Garald Balding, PA-C Authorized by: Garald Balding, PA-C  Consent: Verbal consent obtained. Written consent not obtained. Risks and benefits: risks, benefits and alternatives were discussed Consent given by: patient Patient understanding: patient states understanding of the procedure being performed Patient consent: the patient's understanding of the procedure matches consent given Procedure consent: procedure consent matches procedure scheduled Relevant documents: relevant documents present and verified Test results: test results available and properly labeled Imaging studies:  imaging studies available Patient identity confirmed: verbally with patient Local anesthesia used: no  Anesthesia: Local anesthesia used: no  Sedation: Patient sedated: yes Sedatives: morphine Analgesia: morphine Sedation start date/time: 04/19/2019 10:25 PM Sedation end date/time: 04/11/2019 10:31 PM  Patient tolerance: patient tolerated the procedure well with no immediate complications Comments: Patient received 8 mL of morphine.  Left ankle reduced and splinted.  Postreduction films pending.    (including critical care time)  Medications Ordered in ED Medications  nystatin (MYCOSTATIN/NYSTOP) topical powder ( Topical Given 04/02/2019 2047)  methylPREDNISolone sodium succinate (SOLU-MEDROL) 125 mg/2 mL injection 125 mg (has no administration in time range)  morphine 2 MG/ML injection 2 mg (2 mg Intravenous Given 03/24/2019 2224)  morphine 4 MG/ML injection 4 mg (4 mg Intravenous Given 04/19/2019 2218)  morphine 2 MG/ML injection 2 mg (2 mg Intravenous Given 03/29/2019 2212)    ED Course  I have reviewed the triage vital signs and the nursing notes.  Pertinent labs & imaging results that were available during my care of the patient were reviewed by me and considered in my medical decision making (see chart for details).  Clinical Course as of Apr 14 2245  Wed Apr 15, 2019  1909 Left ankle x-ray shows Bimalleolar fracture with severe lateral subluxation of the talus.  Discussed case with Dr. Roderic Palau, will consult orthopedics.  Expect the need for reduction and casting. Pt maintains that she can go home and have appropriate care from her husband. Will consult Ortho Reduce and splint, surgery, here or at [facility]?      [MB]  54 Husband is now at bedside.  Upon further discussion the husband was able to give a full picture of the events that happened today.  This morning the patient went to what the patient's husband said was the chiropractor (upon reading her charts it appeared that she went  to [facility] emergency room for hip pain" where she was given oxycodone and Robaxin.  The patient's husband stated that she took the pain medication and the muscle relaxer this morning went to bed.  Upon waking up around 2 PM he states that she was disoriented, confused and not aware of her surroundings.  He waited approximately 15 to 20 minutes to try to get her about for chair.  Stated he thought that she had come to enough and stood her up with her walker.  After standing up he stated that she collapsed.  Husband is unsure if she is back to her baseline mental state.  Notes increases in tremors and occasional jerking movements over the last few weeks.  He does not think she hit her head when she fell.  She is compliant with Eliquis.  He states that he provides all care for his wife and is not sure if he is will be able to manage her care with a broken foot.  Discussed case with Dr. Roderic Palau, will work her up for syncope.  Ordered CT head and neck, UA, EKG, troponins.  Back that patient will require admission to the hospital.  Will consult orthopedics for management of her left ankle break.   [MB]  2017 Consulted on-call orthopedic Dr. Doran Durand with [facility].  He recommended reducing and splinting her dislocation.  Discussed likely admission, patient will need to be seen by him at [facility].  Will arrange for admission there.   [MB]  2119 Dr. Roderic Palau and I will perform reduction and splinting pending CT scan.   [MB]  2154 CT head and neck negative for acute fracture, intracranial bleed, acute stroke.  CT of neck noted a new nonspecific left apical lung opacity  when compared to a 2019 Chest CT. this can be followed up in outpatient.     [MB]  2228 Ordered arterial blood gas due to increased somnolence.  Mildly acidotic with pH of 7.2, PCO2 85.6.  Possibly secondary to oxycodone and Robaxin intake yesterday.  Dr. Roderic Palau performed the left ankle reduction and splint.  Patient tolerated  with no complications.   [MB]    Clinical Course User Index [MB] Lyndel Safe   MDM Rules/Calculators/A&P                     85 year old female with an extensive medical history including A. fib with RVR on Eliquis, heart failure, COPD on 6L of home O2 presents to the ER with left ankle pain after a fall earlier today. Patient is hypertensive upon arrival, previous blood pressures normal which could be attributed to pain.  Will recheck vitals serially. Discussed case with Dr. Roderic Palau and he has seen and evaluated the patient as well.  Will order basic lab work: CBC, [place].  Left ankle x-ray pending.  Patient appears in no acute distress, no neuro deficits appreciated on physical exam.  Pulses intact bilaterally in both extremities.  Patient has 2+ pitting edema in bilateral lower extremities.  Left ankle significantly everted and displaced.  I suspect possible reduction needed.  .  She reported to him that if she is able to be placed in a cast,  her husband is capable of taking care of her at home.  Patient denies head injury/LOC, after discussion with Dr. Roderic Palau CT is not indicated at this time.  We will monitor her neuro status closely.  See ED course for change in plan of care after speaking with husband who is her primary caretaker.  Will initiate work-up for syncope given history.   Reduction of left ankle performed by myself and Dr. Roderic Palau.  Patient tolerated the procedure without complications.  Postreduction films pending.  Patient's blood gas concerning for acidosis with hypercarbia.  She will likely to be placed on BiPAP.  Chest x-ray pending.  At this point patient will need to be admitted.  Will need to consult hospitalist.  Signed out patient to [facility].    Final Clinical Impression(s) / ED Diagnoses Final diagnoses:  None    Rx / DC Orders ED Discharge Orders    None       Lyndel Safe 04/11/2019 2247    Milton Ferguson, MD 04/20/19 [phone]

## 2019-04-15 NOTE — ED Notes (Signed)
Patient placed on 15 liters non-re breather due to low O2 sats. Patient's oxygen saturation is 100 percent . Respiratory notified.

## 2019-04-16 DIAGNOSIS — I2781 Cor pulmonale (chronic): Secondary | ICD-10-CM | POA: Diagnosis present

## 2019-04-16 DIAGNOSIS — N179 Acute kidney failure, unspecified: Secondary | ICD-10-CM | POA: Diagnosis present

## 2019-04-16 DIAGNOSIS — E872 Acidosis: Secondary | ICD-10-CM | POA: Diagnosis present

## 2019-04-16 DIAGNOSIS — J9621 Acute and chronic respiratory failure with hypoxia: Secondary | ICD-10-CM | POA: Diagnosis present

## 2019-04-16 DIAGNOSIS — J986 Disorders of diaphragm: Secondary | ICD-10-CM | POA: Diagnosis present

## 2019-04-16 DIAGNOSIS — I130 Hypertensive heart and chronic kidney disease with heart failure and stage 1 through stage 4 chronic kidney disease, or unspecified chronic kidney disease: Secondary | ICD-10-CM | POA: Diagnosis present

## 2019-04-16 DIAGNOSIS — J9692 Respiratory failure, unspecified with hypercapnia: Secondary | ICD-10-CM | POA: Diagnosis present

## 2019-04-16 DIAGNOSIS — G9341 Metabolic encephalopathy: Secondary | ICD-10-CM | POA: Diagnosis present

## 2019-04-16 DIAGNOSIS — S82892A Other fracture of left lower leg, initial encounter for closed fracture: Secondary | ICD-10-CM | POA: Diagnosis not present

## 2019-04-16 DIAGNOSIS — S82892D Other fracture of left lower leg, subsequent encounter for closed fracture with routine healing: Secondary | ICD-10-CM | POA: Diagnosis not present

## 2019-04-16 DIAGNOSIS — T402X5A Adverse effect of other opioids, initial encounter: Secondary | ICD-10-CM | POA: Diagnosis present

## 2019-04-16 DIAGNOSIS — I480 Paroxysmal atrial fibrillation: Secondary | ICD-10-CM | POA: Diagnosis present

## 2019-04-16 DIAGNOSIS — N1831 Chronic kidney disease, stage 3a: Secondary | ICD-10-CM

## 2019-04-16 DIAGNOSIS — Z66 Do not resuscitate: Secondary | ICD-10-CM | POA: Diagnosis present

## 2019-04-16 DIAGNOSIS — Z20822 Contact with and (suspected) exposure to covid-19: Secondary | ICD-10-CM | POA: Diagnosis present

## 2019-04-16 DIAGNOSIS — I5032 Chronic diastolic (congestive) heart failure: Secondary | ICD-10-CM | POA: Diagnosis not present

## 2019-04-16 DIAGNOSIS — J9690 Respiratory failure, unspecified, unspecified whether with hypoxia or hypercapnia: Secondary | ICD-10-CM | POA: Diagnosis not present

## 2019-04-16 DIAGNOSIS — Z515 Encounter for palliative care: Secondary | ICD-10-CM | POA: Diagnosis not present

## 2019-04-16 DIAGNOSIS — Z6841 Body Mass Index (BMI) 40.0 and over, adult: Secondary | ICD-10-CM | POA: Diagnosis not present

## 2019-04-16 DIAGNOSIS — E1122 Type 2 diabetes mellitus with diabetic chronic kidney disease: Secondary | ICD-10-CM | POA: Diagnosis present

## 2019-04-16 DIAGNOSIS — J439 Emphysema, unspecified: Secondary | ICD-10-CM | POA: Diagnosis present

## 2019-04-16 DIAGNOSIS — J441 Chronic obstructive pulmonary disease with (acute) exacerbation: Secondary | ICD-10-CM | POA: Diagnosis not present

## 2019-04-16 DIAGNOSIS — W19XXXA Unspecified fall, initial encounter: Secondary | ICD-10-CM

## 2019-04-16 DIAGNOSIS — M351 Other overlap syndromes: Secondary | ICD-10-CM | POA: Diagnosis present

## 2019-04-16 DIAGNOSIS — W010XXA Fall on same level from slipping, tripping and stumbling without subsequent striking against object, initial encounter: Secondary | ICD-10-CM | POA: Diagnosis present

## 2019-04-16 DIAGNOSIS — Y92009 Unspecified place in unspecified non-institutional (private) residence as the place of occurrence of the external cause: Secondary | ICD-10-CM | POA: Diagnosis not present

## 2019-04-16 DIAGNOSIS — E662 Morbid (severe) obesity with alveolar hypoventilation: Secondary | ICD-10-CM | POA: Diagnosis present

## 2019-04-16 DIAGNOSIS — Z7189 Other specified counseling: Secondary | ICD-10-CM | POA: Diagnosis not present

## 2019-04-16 DIAGNOSIS — S82899A Other fracture of unspecified lower leg, initial encounter for closed fracture: Secondary | ICD-10-CM | POA: Diagnosis not present

## 2019-04-16 DIAGNOSIS — I5031 Acute diastolic (congestive) heart failure: Secondary | ICD-10-CM | POA: Diagnosis not present

## 2019-04-16 DIAGNOSIS — J181 Lobar pneumonia, unspecified organism: Secondary | ICD-10-CM

## 2019-04-16 DIAGNOSIS — J690 Pneumonitis due to inhalation of food and vomit: Secondary | ICD-10-CM | POA: Diagnosis present

## 2019-04-16 DIAGNOSIS — E875 Hyperkalemia: Secondary | ICD-10-CM | POA: Diagnosis present

## 2019-04-16 DIAGNOSIS — J9622 Acute and chronic respiratory failure with hypercapnia: Secondary | ICD-10-CM | POA: Diagnosis present

## 2019-04-16 DIAGNOSIS — S82842A Displaced bimalleolar fracture of left lower leg, initial encounter for closed fracture: Secondary | ICD-10-CM | POA: Diagnosis present

## 2019-04-16 DIAGNOSIS — I2723 Pulmonary hypertension due to lung diseases and hypoxia: Secondary | ICD-10-CM | POA: Diagnosis present

## 2019-04-16 DIAGNOSIS — I5033 Acute on chronic diastolic (congestive) heart failure: Secondary | ICD-10-CM | POA: Diagnosis present

## 2019-04-16 LAB — BASIC METABOLIC PANEL
Anion gap: 6 (ref 5–15)
Anion gap: 9 (ref 5–15)
Anion gap: 9 (ref 5–15)
BUN: 29 mg/dL — ABNORMAL HIGH (ref 8–23)
BUN: 32 mg/dL — ABNORMAL HIGH (ref 8–23)
BUN: 36 mg/dL — ABNORMAL HIGH (ref 8–23)
CO2: 32 mmol/L (ref 22–32)
CO2: 31 mmol/L (ref 22–32)
CO2: 35 mmol/L — ABNORMAL HIGH (ref 22–32)
Calcium: 9 mg/dL (ref 8.9–10.3)
Calcium: 9.4 mg/dL (ref 8.9–10.3)
Calcium: 9.5 mg/dL (ref 8.9–10.3)
Chloride: 101 mmol/L (ref 98–111)
Chloride: 101 mmol/L (ref 98–111)
Chloride: 100 mmol/L (ref 98–111)
Creatinine, Ser: 1.34 mg/dL — ABNORMAL HIGH (ref 0.44–1.00)
Creatinine, Ser: 1.48 mg/dL — ABNORMAL HIGH (ref 0.44–1.00)
Creatinine, Ser: 1.51 mg/dL — ABNORMAL HIGH (ref 0.44–1.00)
GFR calc Af Amer: 42 mL/min — ABNORMAL LOW (ref >60)
GFR calc Af Amer: 38 mL/min — ABNORMAL LOW (ref >60)
GFR calc Af Amer: 37 mL/min — ABNORMAL LOW (ref >60)
GFR calc non Af Amer: 37 mL/min — ABNORMAL LOW (ref >60)
GFR calc non Af Amer: 32 mL/min — ABNORMAL LOW (ref >60)
GFR calc non Af Amer: 32 mL/min — ABNORMAL LOW (ref >60)
Glucose, Bld: 228 mg/dL — ABNORMAL HIGH (ref 70–99)
Glucose, Bld: 230 mg/dL — ABNORMAL HIGH (ref 70–99)
Glucose, Bld: 191 mg/dL — ABNORMAL HIGH (ref 70–99)
Potassium: 6.2 mmol/L — ABNORMAL HIGH (ref 3.5–5.1)
Potassium: 6.5 mmol/L (ref 3.5–5.1)
Potassium: 5.4 mmol/L — ABNORMAL HIGH (ref 3.5–5.1)
Sodium: 139 mmol/L (ref 135–145)
Sodium: 141 mmol/L (ref 135–145)
Sodium: 144 mmol/L (ref 135–145)

## 2019-04-16 LAB — BLOOD GAS, ARTERIAL
Acid-Base Excess: 5.1 mmol/L — ABNORMAL HIGH (ref 0.0–2.0)
Bicarbonate: 27.5 mmol/L (ref 20.0–28.0)
Drawn by: 41977
Expiratory PAP: 6
FIO2: 100
FIO2: 45
Inspiratory PAP: 14
O2 Saturation: 99 %
O2 Saturation: 91.5 %
Patient temperature: 37
Patient temperature: 37
RATE: 16 resp/min
pCO2 arterial: >120 mmHg (ref 32.0–48.0)
pCO2 arterial: 86.3 mmHg (ref 32.0–48.0)
pH, Arterial: 7.035 — CL (ref 7.350–7.450)
pH, Arterial: 7.203 — ABNORMAL LOW (ref 7.350–7.450)
pO2, Arterial: 227 mmHg — ABNORMAL HIGH (ref 83.0–108.0)
pO2, Arterial: 65.1 mmHg — ABNORMAL LOW (ref 83.0–108.0)

## 2019-04-16 LAB — CBC
HCT: 37.3 % (ref 36.0–46.0)
Hemoglobin: 9.6 g/dL — ABNORMAL LOW (ref 12.0–15.0)
MCH: 24.1 pg — ABNORMAL LOW (ref 26.0–34.0)
MCHC: 25.7 g/dL — ABNORMAL LOW (ref 30.0–36.0)
MCV: 93.5 fL (ref 80.0–100.0)
Platelets: 330 10*3/uL (ref 150–400)
RBC: 3.99 MIL/uL (ref 3.87–5.11)
RDW: 20.2 % — ABNORMAL HIGH (ref 11.5–15.5)
WBC: 19 10*3/uL — ABNORMAL HIGH (ref 4.0–10.5)
nRBC: 0.2 % (ref 0.0–0.2)

## 2019-04-16 LAB — HEMOGLOBIN A1C
Hgb A1c MFr Bld: 6.3 % — ABNORMAL HIGH (ref 4.8–5.6)
Hgb A1c MFr Bld: 6.3 % — ABNORMAL HIGH (ref 4.8–5.6)
Mean Plasma Glucose: 134.11 mg/dL
Mean Plasma Glucose: 134.11 mg/dL

## 2019-04-16 LAB — URINALYSIS, COMPLETE (UACMP) WITH MICROSCOPIC
Bacteria, UA: NONE SEEN
Bilirubin Urine: NEGATIVE
Glucose, UA: NEGATIVE mg/dL
Hgb urine dipstick: NEGATIVE
Ketones, ur: NEGATIVE mg/dL
Leukocytes,Ua: NEGATIVE
Nitrite: NEGATIVE
Protein, ur: NEGATIVE mg/dL
Specific Gravity, Urine: 1.021 (ref 1.005–1.030)
pH: 5 (ref 5.0–8.0)

## 2019-04-16 LAB — GLUCOSE, CAPILLARY
Glucose-Capillary: 150 mg/dL — ABNORMAL HIGH (ref 70–99)
Glucose-Capillary: 152 mg/dL — ABNORMAL HIGH (ref 70–99)
Glucose-Capillary: 252 mg/dL — ABNORMAL HIGH (ref 70–99)

## 2019-04-16 LAB — CBG MONITORING, ED
Glucose-Capillary: 186 mg/dL — ABNORMAL HIGH (ref 70–99)
Glucose-Capillary: 200 mg/dL — ABNORMAL HIGH (ref 70–99)

## 2019-04-16 LAB — TROPONIN I (HIGH SENSITIVITY)
Troponin I (High Sensitivity): 67 ng/L — ABNORMAL HIGH (ref <18)
Troponin I (High Sensitivity): 102 ng/L (ref <18)

## 2019-04-16 LAB — SARS CORONAVIRUS 2 (TAT 6-24 HRS): SARS Coronavirus 2: NEGATIVE

## 2019-04-16 LAB — BRAIN NATRIURETIC PEPTIDE: B Natriuretic Peptide: 996 pg/mL — ABNORMAL HIGH (ref 0.0–100.0)

## 2019-04-16 LAB — MRSA PCR SCREENING: MRSA by PCR: NEGATIVE

## 2019-04-16 LAB — PROCALCITONIN: Procalcitonin: <0.1 ng/mL

## 2019-04-16 MED ORDER — FERROUS SULFATE 325 (65 FE) MG PO TABS: 325.0000 mg | ORAL_TABLET | Freq: Two times a day (BID) | ORAL | Status: DC

## 2019-04-16 MED ORDER — SODIUM BICARBONATE 8.4 % IV SOLN
50.0000 meq | Freq: Once | INTRAVENOUS | Status: AC
  Administered 2019-04-16: 50 meq via INTRAVENOUS

## 2019-04-16 MED ORDER — ACETAMINOPHEN 325 MG PO TABS
650.0000 mg | ORAL_TABLET | Freq: Four times a day (QID) | ORAL | Status: DC | PRN
  Filled 2019-04-16: qty 2

## 2019-04-16 MED ORDER — MORPHINE SULFATE (PF) 2 MG/ML IV SOLN
2.0000 mg | Freq: Once | INTRAVENOUS | Status: AC
  Administered 2019-04-16: 20:00:00 2 mg via INTRAVENOUS
  Filled 2019-04-16: qty 1

## 2019-04-16 MED ORDER — METOPROLOL SUCCINATE ER 25 MG PO TB24: 25.0000 mg | ORAL_TABLET | Freq: Every day | ORAL | Status: DC

## 2019-04-16 MED ORDER — ENOXAPARIN SODIUM 40 MG/0.4ML ~~LOC~~ SOLN
40.0000 mg | SUBCUTANEOUS | Status: DC
  Administered 2019-04-16 – 2019-04-17 (×2): 40 mg via SUBCUTANEOUS
  Filled 2019-04-16 (×2): qty 0.4

## 2019-04-16 MED ORDER — FUROSEMIDE 10 MG/ML IJ SOLN
40.0000 mg | Freq: Once | INTRAMUSCULAR | Status: AC
  Administered 2019-04-16: 40 mg via INTRAVENOUS
  Filled 2019-04-16: qty 4

## 2019-04-16 MED ORDER — SODIUM CHLORIDE 0.9 % IV SOLN
500.0000 mg | INTRAVENOUS | Status: DC
  Administered 2019-04-16 – 2019-04-20 (×6): 500 mg via INTRAVENOUS
  Filled 2019-04-16 (×5): qty 500

## 2019-04-16 MED ORDER — ACETAMINOPHEN 650 MG RE SUPP
650.0000 mg | Freq: Four times a day (QID) | RECTAL | Status: DC | PRN
  Administered 2019-04-18: 650 mg via RECTAL
  Filled 2019-04-16: qty 1

## 2019-04-16 MED ORDER — ONDANSETRON HCL 4 MG PO TABS: 4.0000 mg | ORAL_TABLET | Freq: Four times a day (QID) | ORAL | Status: DC | PRN

## 2019-04-16 MED ORDER — ORAL CARE MOUTH RINSE
15.0000 mL | Freq: Two times a day (BID) | OROMUCOSAL | Status: DC
  Administered 2019-04-16 – 2019-04-20 (×10): 15 mL via OROMUCOSAL

## 2019-04-16 MED ORDER — SODIUM CHLORIDE 0.9% FLUSH: 3.0000 mL | INTRAVENOUS | Status: DC | PRN

## 2019-04-16 MED ORDER — MOMETASONE FURO-FORMOTEROL FUM 200-5 MCG/ACT IN AERO: 2.0000 | INHALATION_SPRAY | Freq: Two times a day (BID) | RESPIRATORY_TRACT | Status: DC

## 2019-04-16 MED ORDER — CHLORHEXIDINE GLUCONATE 0.12 % MT SOLN
15.0000 mL | Freq: Two times a day (BID) | OROMUCOSAL | Status: DC
  Administered 2019-04-16 – 2019-04-21 (×11): 15 mL via OROMUCOSAL
  Filled 2019-04-16 (×8): qty 15

## 2019-04-16 MED ORDER — SODIUM POLYSTYRENE SULFONATE 15 GM/60ML PO SUSP
45.0000 g | Freq: Once | ORAL | Status: AC
  Administered 2019-04-16: 45 g via RECTAL
  Filled 2019-04-16: qty 180

## 2019-04-16 MED ORDER — SODIUM CHLORIDE 0.9 % IV SOLN: 250.0000 mL | INTRAVENOUS | Status: DC | PRN

## 2019-04-16 MED ORDER — CHLORHEXIDINE GLUCONATE CLOTH 2 % EX PADS
6.0000 | MEDICATED_PAD | Freq: Every day | CUTANEOUS | Status: DC
  Administered 2019-04-16 – 2019-04-21 (×6): 6 via TOPICAL

## 2019-04-16 MED ORDER — LATANOPROST 0.005 % OP SOLN
1.0000 [drp] | Freq: Every day | OPHTHALMIC | Status: DC
  Administered 2019-04-16 – 2019-04-20 (×5): 1 [drp] via OPHTHALMIC
  Filled 2019-04-16 (×2): qty 2.5

## 2019-04-16 MED ORDER — IPRATROPIUM-ALBUTEROL 0.5-2.5 (3) MG/3ML IN SOLN: 3.0000 mL | RESPIRATORY_TRACT | Status: DC | PRN

## 2019-04-16 MED ORDER — DEXTROSE 50 % IV SOLN
1.0000 | Freq: Once | INTRAVENOUS | Status: AC
  Administered 2019-04-16: 50 mL via INTRAVENOUS
  Filled 2019-04-16: qty 50

## 2019-04-16 MED ORDER — INSULIN ASPART 100 UNIT/ML ~~LOC~~ SOLN
10.0000 [IU] | Freq: Once | SUBCUTANEOUS | Status: AC
  Administered 2019-04-16: 10 [IU] via INTRAVENOUS
  Filled 2019-04-16: qty 1

## 2019-04-16 MED ORDER — SODIUM POLYSTYRENE SULFONATE 15 GM/60ML PO SUSP
30.0000 g | Freq: Once | ORAL | Status: DC
  Filled 2019-04-16: qty 120

## 2019-04-16 MED ORDER — METHYLPREDNISOLONE SODIUM SUCC 40 MG IJ SOLR
40.0000 mg | Freq: Two times a day (BID) | INTRAMUSCULAR | Status: DC
  Administered 2019-04-16 – 2019-04-21 (×11): 40 mg via INTRAVENOUS
  Filled 2019-04-16 (×11): qty 1

## 2019-04-16 MED ORDER — INSULIN ASPART 100 UNIT/ML IV SOLN
10.0000 [IU] | Freq: Once | INTRAVENOUS | Status: AC
  Administered 2019-04-16: 10 [IU] via INTRAVENOUS

## 2019-04-16 MED ORDER — POTASSIUM CHLORIDE CRYS ER 10 MEQ PO TBCR
10.0000 meq | EXTENDED_RELEASE_TABLET | Freq: Every day | ORAL | Status: DC
  Filled 2019-04-16 (×2): qty 1

## 2019-04-16 MED ORDER — VITAMIN D 25 MCG (1000 UNIT) PO TABS
1000.0000 [IU] | ORAL_TABLET | Freq: Every day | ORAL | Status: DC
  Filled 2019-04-16 (×2): qty 1

## 2019-04-16 MED ORDER — PIPERACILLIN-TAZOBACTAM 3.375 G IVPB
3.3750 g | Freq: Three times a day (TID) | INTRAVENOUS | Status: DC
  Administered 2019-04-16 – 2019-04-17 (×4): 3.375 g via INTRAVENOUS
  Filled 2019-04-16 (×4): qty 50

## 2019-04-16 MED ORDER — BISACODYL 10 MG RE SUPP: 10.0000 mg | Freq: Every day | RECTAL | Status: DC | PRN

## 2019-04-16 MED ORDER — CALCIUM GLUCONATE-NACL 1-0.675 GM/50ML-% IV SOLN
1.0000 g | Freq: Once | INTRAVENOUS | Status: AC
  Administered 2019-04-16: 1000 mg via INTRAVENOUS
  Filled 2019-04-16: qty 50

## 2019-04-16 MED ORDER — INSULIN ASPART 100 UNIT/ML ~~LOC~~ SOLN
0.0000 [IU] | Freq: Four times a day (QID) | SUBCUTANEOUS | Status: DC
  Administered 2019-04-16: 2 [IU] via SUBCUTANEOUS
  Administered 2019-04-16: 5 [IU] via SUBCUTANEOUS
  Administered 2019-04-16: 1 [IU] via SUBCUTANEOUS
  Administered 2019-04-16 – 2019-04-17 (×4): 2 [IU] via SUBCUTANEOUS
  Administered 2019-04-17: 3 [IU] via SUBCUTANEOUS
  Administered 2019-04-18 – 2019-04-21 (×13): 2 [IU] via SUBCUTANEOUS
  Filled 2019-04-16 (×3): qty 1

## 2019-04-16 MED ORDER — FUROSEMIDE 20 MG PO TABS: 20.0000 mg | ORAL_TABLET | Freq: Two times a day (BID) | ORAL | Status: DC

## 2019-04-16 MED ORDER — ONDANSETRON HCL 4 MG/2ML IJ SOLN: 4.0000 mg | Freq: Four times a day (QID) | INTRAMUSCULAR | Status: DC | PRN

## 2019-04-16 MED ORDER — SODIUM CHLORIDE 0.9% FLUSH
3.0000 mL | Freq: Two times a day (BID) | INTRAVENOUS | Status: DC
  Administered 2019-04-16 – 2019-04-20 (×7): 3 mL via INTRAVENOUS

## 2019-04-16 MED ORDER — DILTIAZEM HCL ER COATED BEADS 240 MG PO CP24
240.0000 mg | ORAL_CAPSULE | Freq: Every day | ORAL | Status: DC
  Filled 2019-04-16 (×2): qty 1

## 2019-04-16 MED ORDER — DEXTROSE 50 % IV SOLN
25.0000 g | Freq: Once | INTRAVENOUS | Status: AC
  Administered 2019-04-16: 25 g via INTRAVENOUS
  Filled 2019-04-16: qty 50

## 2019-04-16 NOTE — ED Notes (Signed)
Date and time results received: 04/16/19 )04:42 (use smartphrase ".now" to insert current time)  Test: PH 7.035  PCO2 greater than 120 Critical Value:   Name of Provider Notified: Provider notified/Hospitalist   Orders Received? Provider notified

## 2019-04-16 NOTE — H&P (Signed)
PROGRESS NOTE  Becky Newton YBO:[id number] DOB: 1933/06/22 DOA: 04/04/2019 PCP: [name], NP  Brief History:  86 year old female with a history of COPD, chronic respiratory failure on 6 L, paroxysmal atrial fibrillation, diastolic CHF, OSA, diabetes mellitus type 2 presenting with a mechanical fall and left ankle fracture.  The patient is unable to provide history secondary to her encephalopathy.  All the history is obtained from review of the medical record.  According to the patient's spouse, the patient went to urgent care in the early morning of 04/04/2019 for right hip/leg pain.  The patient was prescribed oxycodone and Robaxin.  He subsequently drove her home and the patient took her opioids.  Around 2 PM, the patient woke up confused and somewhat lethargic.  Her spouse waited for period of time until the patient was more alert.  He helped her transfer to a walker, but she sustained a mechanical fall during the transfer resulting in left leg pain.  She cannot get up.  EMS was activated.  In the emergency department, the patient was afebrile hemodynamically stable initially on 6 L.  She was confused.  X-ray showed a bimalleolar fracture of her left ankle.  Orthopedics, Dr. Doran Durand was consulted.  He recommended transfer to [facility] as well as reduction and splint placement.  Patient became increasingly hypoxic and was placed on BiPAP initially.  She was given morphine in preparation for her reduction and splint placement.  Subsequently, the patient became somnolent.  Repeat ABG showed 7.035/> 120/227 on 100%.  Chest x-ray showed small bilateral effusions with chronic left basilar consolidation.  Because of the patient's respiratory failure and requirement for BiPAP, she was not stable for transfer to [facility].  I contacted orthopedics, Dr. Doran Durand once again.  He stated that the patient can be maintained at [facility] for now and remain in her splint with nonweightbearing.  She can  follow up with him in the office in 1 to 2 weeks after discharge.  Assessment/Plan: Acute on chronic respiratory failure with hypoxia and hypercarbia -Normally maintained on 6 L nasal cannula at home -Multifactorial including hypoventilation, fluid overload, COPD, OHS/OSA and possible pneumonia -Remain on BiPAP  Lobar pneumonia -Check procalcitonin -Suspect component of aspiration -Start Zosyn -MRSA screen  Hyperkalemia -Kayexalate -bicarb -D50/IV insulin -calcium gluconate -tele -repeat BMP later today  Acute on chronic renal failure--CKD 3a -baseline creatinine 0.8-1.1 -serum creatinine peak 1.48  Bimalleolar fracture left ankle -I discussed case with orthopedics, Dr. Lawson Radar made him aware of the patient's instability for transfer down to [facility] -After discussion with Dr. Doran Durand keep patient at any pain with  splint, nonweightbearing   COPD -Start bronchodilators  Paroxysmal atrial fibrillation -Presently sinus rhythm -Patient is not on anticoagulation chronically--likely due to gait instability and frequent falls -Continue metoprolol succinate  Acute metabolic encephalopathy -Secondary to opioids as well as infectious process and hypercarbia  Diabetes mellitus type 2 -NovoLog sliding scale -Hemoglobin A1c  Iron deficiency anemia -04/01/2019 iron saturation 5%, ferritin 14 -Plan to give IV iron once the patient stabilized  Morbid Obesity -BMI 40.43 -lifestyle modification  OSA/OHS -noncompliant with CPAP  Chronic diastolic CHF -daily weights       Disposition Plan: Patient From: Home D/C Place: SNF - 2-3  Days Barriers: Not Clinically Stable--respiratory failure on BiPAP  Family Communication:   Spouse updated at bedside 3/25  Consultants:  none  Code Status:  DNR  DVT Prophylaxis:   [place]  Lovenox   Procedures: As Listed in Progress Note Above  Antibiotics: Zosyn 3/25>>>    The patient is critically ill with multiple organ  systems failure and requires high complexity decision making for assessment and support, frequent evaluation and titration of therapies, application of advanced monitoring technologies and extensive interpretation of multiple databases.  Critical care time - 45 mins.     Subjective: ROS not possible due to encephalopathy  Objective: Vitals:   04/16/19 0545 04/16/19 0600 04/16/19 0630 04/16/19 0700  BP: (!) 98/51 (!) 102/55 (!) 95/39 (!) 100/46  Pulse: 83 85 84   Resp: _0 Temp:      SpO2: (!) 86%  91%   Weight:      Height:       No intake or output data in the 24 hours ending 04/16/19 0855 Weight change:  Exam:   General:  Pt is somnolent on BiPAP.  Opens eyes to voice.  Not following commands  HEENT: No icterus, No thrush, No neck mass, [place]/AT  Cardiovascular: RRR, S1/S2, no rubs, no gallops  Respiratory: diminished BS bilateral.  Bilateral crackles  Abdomen: Soft/+BS, non tender, non distended, no guarding  Extremities: 2+LE edema, No lymphangitis, No petechiae, No rashes, no synovitis   Data Reviewed: I have personally reviewed following labs and imaging studies Basic Metabolic Panel: Recent Labs  Lab 04/12/2019 1853 04/16/19 0313 04/16/19 0751  NA 141 139 141  K 5.4* 6.2* 6.5*  CL 101 101 101  CO2 32 32 31  GLUCOSE 155* 228* 230*  BUN 27* 29* 32*  CREATININE 1.07* 1.34* 1.48*  CALCIUM 9.2 9.0 9.4   Liver Function Tests: Recent Labs  Lab 03/30/2019 1853  AST 12*  ALT 12  ALKPHOS 91  BILITOT 0.4  PROT 6.5  ALBUMIN 3.2*   No results for input(s): LIPASE, AMYLASE in the last 168 hours. No results for input(s): AMMONIA in the last 168 hours. Coagulation Profile: No results for input(s): INR, PROTIME in the last 168 hours. CBC: Recent Labs  Lab  1853 04/16/19 0313  WBC 13.6* 19.0*  NEUTROABS 11.3*  --   HGB 9.6* 9.6*  HCT 36.3 37.3  MCV 91.7 93.5  PLT 279 330   Cardiac Enzymes: No results for input(s): CKTOTAL, CKMB,  CKMBINDEX, TROPONINI in the last 168 hours. BNP: Invalid input(s): POCBNP CBG: Recent Labs  Lab 04/16/19 0217 04/16/19 0605  GLUCAP 186* 200*   HbA1C: Recent Labs    04/03/2019 2105  HGBA1C 6.3*   Urine analysis:    Component Value Date/Time   COLORURINE STRAW (A) 03/08/2017 0641   APPEARANCEUR CLEAR 03/08/2017 0641   LABSPEC 1.005 03/08/2017 0641   PHURINE 6.0 03/08/2017 0641   GLUCOSEU NEGATIVE 03/08/2017 0641   HGBUR NEGATIVE 03/08/2017 0641   BILIRUBINUR NEGATIVE 03/08/2017 0641   KETONESUR NEGATIVE 03/08/2017 0641   PROTEINUR NEGATIVE 03/08/2017 0641   NITRITE NEGATIVE 03/08/2017 0641   LEUKOCYTESUR NEGATIVE 03/08/2017 0641   Sepsis Labs: _1 (procalcitonin:4,lacticidven:4) ) Recent Results (from the past 240 hour(s))  Respiratory Panel by RT PCR (Flu A&B, Covid) - Nasopharyngeal Swab     Status: None   Collection Time: 04/16/2019 10:36 PM   Specimen: Nasopharyngeal Swab  Result Value Ref Range Status   SARS Coronavirus 2 by RT PCR NEGATIVE NEGATIVE Final    Comment: (NOTE) SARS-CoV-2 target nucleic acids are NOT DETECTED. The SARS-CoV-2 RNA is generally detectable in upper respiratoy specimens during the acute phase of infection. The lowest concentration of SARS-CoV-2  viral copies this assay can detect is 131 copies/mL. A negative result does not preclude SARS-Cov-2 infection and should not be used as the sole basis for treatment or other patient management decisions. A negative result may occur with  improper specimen collection/handling, submission of specimen other than nasopharyngeal swab, presence of viral mutation(s) within the areas targeted by this assay, and inadequate number of viral copies (<131 copies/mL). A negative result must be combined with clinical observations, patient history, and epidemiological information. The expected result is Negative. Fact Sheet for Patients:  PinkCheek.be Fact Sheet for Healthcare  Providers:  GravelBags.it This test is not yet ap proved or cleared by the [place] FDA and  has been authorized for detection and/or diagnosis of SARS-CoV-2 by FDA under an Emergency Use Authorization (EUA). This EUA will remain  in effect (meaning this test can be used) for the duration of the COVID-19 declaration under Section 564(b)(1) of the Act, 21 U.S.C. section 360bbb-3(b)(1), unless the authorization is terminated or revoked sooner.    Influenza A by PCR NEGATIVE NEGATIVE Final   Influenza B by PCR NEGATIVE NEGATIVE Final    Comment: (NOTE) The Xpert Xpress SARS-CoV-2/FLU/RSV assay is intended as an aid in  the diagnosis of influenza from Nasopharyngeal swab specimens and  should not be used as a sole basis for treatment. Nasal washings and  aspirates are unacceptable for Xpert Xpress SARS-CoV-2/FLU/RSV  testing. Fact Sheet for Patients: PinkCheek.be Fact Sheet for Healthcare Providers: GravelBags.it This test is not yet approved or cleared by the [place] FDA and  has been authorized for detection and/or diagnosis of SARS-CoV-2 by  FDA under an Emergency Use Authorization (EUA). This EUA will remain  in effect (meaning this test can be used) for the duration of the  Covid-19 declaration under Section 564(b)(1) of the Act, 21  U.S.C. section 360bbb-3(b)(1), unless the authorization is  terminated or revoked. Performed at [facility], [address], [place], [place] [address]      Scheduled Meds: . cholecalciferol  1,000 Units Oral QHS  . dextrose  1 ampule Intravenous Once  . diltiazem  240 mg Oral Daily  . enoxaparin (LOVENOX) injection  40 mg Subcutaneous Q24H  . ferrous sulfate  325 mg Oral BID WC  . furosemide  20 mg Oral BID  . insulin aspart  0-9 Units Subcutaneous Q6H  . insulin aspart  10 Units Intravenous Once  . latanoprost  1 drop Both Eyes QHS  .  methylPREDNISolone (SOLU-MEDROL) injection  40 mg Intravenous Q12H  . metoprolol succinate  25 mg Oral Daily  . mometasone-formoterol  2 puff Inhalation BID  . nystatin   Topical BID  . sodium bicarbonate  50 mEq Intravenous Once  . sodium chloride flush  3 mL Intravenous Q12H  . sodium polystyrene  30 g Rectal Once   Continuous Infusions: . sodium chloride    . calcium gluconate      Procedures/Studies: DG Ankle 2 Views Left  Result Date: 04/04/2019 CLINICAL DATA:  Bimalleolar ankle fracture, reduction EXAM: LEFT ANKLE - 2 VIEW COMPARISON:  04/13/2019 FINDINGS: Frontal and lateral views of the left ankle demonstrate reduction of the bimalleolar ankle fracture seen previously, with near anatomic alignment of the medial and lateral malleoli. The talus is now aligned anatomic [place] with the tibial plafond. Diffuse soft tissue edema. IMPRESSION: 1. Near anatomic alignment of the bimalleolar fracture seen previously. 2. Anatomic alignment of the tibiotalar joint. Electronically Signed   By: Diana Eves.D.  On: 04/17/2019 23:11   DG Ankle Complete Left  Result Date: 03/27/2019 CLINICAL DATA:  [place], pain and bruising, visible deformity EXAM: LEFT ANKLE COMPLETE - 3+ VIEW COMPARISON:  None. FINDINGS: Frontal and cross-table lateral views of the left ankle are obtained. Mildly comminuted oblique fracture of the distal fibular diaphysis is noted. There is a comminuted transverse fracture of the medial malleolus. There is lateral displacement of the medial and lateral malleolar fracture fragments, with severe lateral subluxation of the talus relative to the tibial plafond. There is diffuse soft tissue edema. IMPRESSION: 1. Bimalleolar fracture with severe lateral subluxation of the talus. Electronically Signed   By: Randa Ngo M.D.   On: 04/13/2019 18:54   CT Head Wo Contrast  Result Date: 04/10/2019 CLINICAL DATA:  86 year old female status post syncope and fall. EXAM: CT HEAD WITHOUT CONTRAST  TECHNIQUE: Contiguous axial images were obtained from the base of the skull through the vertex without intravenous contrast. COMPARISON:  None. FINDINGS: Brain: Cerebral volume is within normal limits for age. No midline shift, ventriculomegaly, mass effect, evidence of mass lesion, intracranial hemorrhage or evidence of cortically based acute infarction. Possible small focus of subependymal gray matter heterotopia at the left lateral ventricle on series 2, image 15. Elsewhere gray-white matter differentiation appears normal for age. Vascular: Calcified atherosclerosis at the skull base. No suspicious intracranial vascular hyperdensity. Skull: Negative, no skull fracture identified. Sinuses/Orbits: Bilateral mastoid effusions and middle ear opacification, appears to be inflammatory. Negative visible nasopharynx. Visible paranasal sinuses are clear. Other: No acute orbit or scalp soft tissue injury identified. IMPRESSION: 1. No acute intracranial abnormality or acute traumatic injury identified. 2. Bilateral middle ear and mastoid opacification appears to be inflammatory. Consider otitis media or cholesteatoma. ENT follow-up may be valuable. Electronically Signed   By: Genevie Ann M.D.   On: 03/31/2019 21:42   CT Cervical Spine Wo Contrast  Result Date: 04/14/2019 CLINICAL DATA:  86 year old female status post syncope and fall. EXAM: CT CERVICAL SPINE WITHOUT CONTRAST TECHNIQUE: Multidetector CT imaging of the cervical spine was performed without intravenous contrast. Multiplanar CT image reconstructions were also generated. COMPARISON:  Head CT today reported separately. Chest CT 08/08/2017. FINDINGS: Alignment: Mild reversal of cervical lordosis. Subtle anterolisthesis at C3-C4 and C4-C5 appears to be degenerative in nature with associated facet arthropathy. Cervicothoracic junction alignment is within normal limits. Bilateral posterior element alignment is within normal limits. Skull base and vertebrae: Visualized  skull base is intact. No atlanto-occipital dissociation. No acute osseous abnormality identified. Mild spina bifida occulta at C6 (normal variant). Soft tissues and spinal canal: No prevertebral fluid or swelling. No visible canal hematoma. Negative noncontrast neck soft tissues, partially retropharyngeal carotids. Disc levels: Widespread bilateral cervical facet arthropathy. Comparatively mild chronic disc and endplate degeneration. No significant spinal stenosis suspected. Upper chest: Visible upper thoracic levels appear intact. There is confluent new lung opacity in the left apex on series 4, image 72 when compared to the 2019 chest CT. This does resemble atelectasis. Negative visible superior mediastinum. IMPRESSION: 1. No acute traumatic injury identified in the cervical spine. 2. New nonspecific left apical lung opacity (series 4, image 72) when compared to a 2019 Chest CT. Query cough. Consider a repeat Chest CT (noncontrast may suffice) in 2-3 months to re-evaluate. Electronically Signed   By: Genevie Ann M.D.   On: 03/30/2019 21:48   DG Chest Portable 1 View  Result Date: 04/06/2019 CLINICAL DATA:  Shortness of breath, left ankle fracture EXAM: PORTABLE CHEST 1 VIEW COMPARISON:  10/28/2018 FINDINGS: Single frontal view of the chest demonstrates persistent enlargement of the cardiac silhouette. There is chronic central vascular congestion, with chronic left basilar consolidation unchanged. Small bilateral pleural effusions versus pleural thickening, left greater than right, again noted. No pneumothorax. No acute bony abnormality. IMPRESSION: 1. Chronic vascular congestion. 2. Chronic left basilar consolidation compatible with atelectasis or scarring. 3. Stable small bilateral pleural effusions versus pleural thickening. Electronically Signed   By: Randa Ngo M.D.   On:  23:12    Orson Eva, DO  [facility]  If 7PM-7AM, please contact night-coverage [email or web address] Password  [facility] 04/16/2019, 8:55 AM   LOS: 0 days

## 2019-04-16 NOTE — ED Notes (Signed)
CRITICAL VALUE ALERT  Critical Value: pCO2 86.3 Trop 102  Date & Time Notied:  04/16/19 0816   Provider Notified: Dr. Carles Collet   Orders Received/Actions taken: None yet

## 2019-04-16 NOTE — Progress Notes (Signed)
Attempted to transition patient off the bipap back onto nasal cannula. After being on 10 L for about 2 mins, patients oxygen went down to 79%. MD made aware. Placed bipap back on the patient. Son states that patients baseline is confusion, and patient does know her name and birth date but that is all. She is constantly yelling help under her bipap mask. Mitts placed, MD made aware of increasing agitation. Will continue to monitor.

## 2019-04-16 NOTE — Progress Notes (Signed)
Patient was asking for water and nurse provide swabbing of her mouth while on 15L HFNC and saturations remained in lower 90s. As the patient became more restless her saturations dropped and RT placed her back on BIPAP.RT/nursing will continue to monitor patient and continue to attempt to wean her from BIPAP.

## 2019-04-16 NOTE — Progress Notes (Signed)
Bladder scanned patient and it showed 240 mL. Order for In and out cath was already placed from earlier this morning. 500 mL obtained. Urine sample and culture obtained and sent to lab. Purewick put back in place. Will continue to monitor.

## 2019-04-16 NOTE — ED Notes (Signed)
IV located in left Ac infiltrated after D50 administered. Warm compresses placed on arm. Hospitalist notified and advised to monitor and continue warm compresses.

## 2019-04-16 NOTE — ED Notes (Addendum)
Date and time results received: 04/16/19 04:00  Test: Potassium Critical Value: 6.2  Name of Provider Notified: hospitalist notified   Orders Received? Provider notified.

## 2019-04-16 NOTE — ED Notes (Signed)
Date and time results received: 04/16/19 0833 (use smartphrase ".now" to insert current time)  Test: K+ Critical Value: 6.5  Name of Provider Notified: Tat  Orders Received? Or Actions Taken?: Orders Received - See Orders for details

## 2019-04-17 ENCOUNTER — Inpatient Hospital Stay (HOSPITAL_COMMUNITY): Payer: Medicare HMO

## 2019-04-17 ENCOUNTER — Telehealth: Payer: Self-pay | Admitting: Adult Health

## 2019-04-17 ENCOUNTER — Inpatient Hospital Stay: Payer: Self-pay

## 2019-04-17 DIAGNOSIS — I5031 Acute diastolic (congestive) heart failure: Secondary | ICD-10-CM

## 2019-04-17 DIAGNOSIS — I2723 Pulmonary hypertension due to lung diseases and hypoxia: Secondary | ICD-10-CM

## 2019-04-17 DIAGNOSIS — Z7189 Other specified counseling: Secondary | ICD-10-CM

## 2019-04-17 DIAGNOSIS — J9622 Acute and chronic respiratory failure with hypercapnia: Principal | ICD-10-CM

## 2019-04-17 DIAGNOSIS — J9621 Acute and chronic respiratory failure with hypoxia: Secondary | ICD-10-CM

## 2019-04-17 DIAGNOSIS — J441 Chronic obstructive pulmonary disease with (acute) exacerbation: Secondary | ICD-10-CM

## 2019-04-17 DIAGNOSIS — G4733 Obstructive sleep apnea (adult) (pediatric): Secondary | ICD-10-CM

## 2019-04-17 LAB — CBC WITH DIFFERENTIAL/PLATELET
Abs Immature Granulocytes: 0.07 10*3/uL (ref 0.00–0.07)
Basophils Absolute: 0 10*3/uL (ref 0.0–0.1)
Basophils Relative: 0 %
Eosinophils Absolute: 0 10*3/uL (ref 0.0–0.5)
Eosinophils Relative: 0 %
HCT: 29.2 % — ABNORMAL LOW (ref 36.0–46.0)
Hemoglobin: 8.2 g/dL — ABNORMAL LOW (ref 12.0–15.0)
Immature Granulocytes: 1 %
Lymphocytes Relative: 6 %
Lymphs Abs: 0.8 10*3/uL (ref 0.7–4.0)
MCH: 24.6 pg — ABNORMAL LOW (ref 26.0–34.0)
MCHC: 28.1 g/dL — ABNORMAL LOW (ref 30.0–36.0)
MCV: 87.4 fL (ref 80.0–100.0)
Monocytes Absolute: 1.4 10*3/uL — ABNORMAL HIGH (ref 0.1–1.0)
Monocytes Relative: 10 %
Neutro Abs: 12.6 10*3/uL — ABNORMAL HIGH (ref 1.7–7.7)
Neutrophils Relative %: 83 %
Platelets: 248 10*3/uL (ref 150–400)
RBC: 3.34 MIL/uL — ABNORMAL LOW (ref 3.87–5.11)
RDW: 21.2 % — ABNORMAL HIGH (ref 11.5–15.5)
WBC: 14.9 10*3/uL — ABNORMAL HIGH (ref 4.0–10.5)
nRBC: 0 % (ref 0.0–0.2)

## 2019-04-17 LAB — CBC
HCT: 29.8 % — ABNORMAL LOW (ref 36.0–46.0)
Hemoglobin: 8.1 g/dL — ABNORMAL LOW (ref 12.0–15.0)
MCH: 24 pg — ABNORMAL LOW (ref 26.0–34.0)
MCHC: 27.2 g/dL — ABNORMAL LOW (ref 30.0–36.0)
MCV: 88.2 fL (ref 80.0–100.0)
Platelets: 215 10*3/uL (ref 150–400)
RBC: 3.38 MIL/uL — ABNORMAL LOW (ref 3.87–5.11)
RDW: 20.9 % — ABNORMAL HIGH (ref 11.5–15.5)
WBC: 13.4 10*3/uL — ABNORMAL HIGH (ref 4.0–10.5)
nRBC: 0.1 % (ref 0.0–0.2)

## 2019-04-17 LAB — ECHOCARDIOGRAM COMPLETE
Height: 61 in
Weight: 3548.52 oz

## 2019-04-17 LAB — IRON AND TIBC
Iron: 16 ug/dL — ABNORMAL LOW (ref 28–170)
Saturation Ratios: 4 % — ABNORMAL LOW (ref 10.4–31.8)
TIBC: 371 ug/dL (ref 250–450)
UIBC: 355 ug/dL

## 2019-04-17 LAB — BLOOD GAS, ARTERIAL
Acid-Base Excess: 9.6 mmol/L — ABNORMAL HIGH (ref 0.0–2.0)
Bicarbonate: 32.5 mmol/L — ABNORMAL HIGH (ref 20.0–28.0)
Drawn by: 22223
Expiratory PAP: 6
FIO2: 50
Inspiratory PAP: 16
O2 Saturation: 96 %
Patient temperature: 37
RATE: 12 resp/min
pCO2 arterial: 74.3 mmHg (ref 32.0–48.0)
pH, Arterial: 7.306 — ABNORMAL LOW (ref 7.350–7.450)
pO2, Arterial: 91.1 mmHg (ref 83.0–108.0)

## 2019-04-17 LAB — COMPREHENSIVE METABOLIC PANEL
ALT: 20 U/L (ref 0–44)
AST: 19 U/L (ref 15–41)
Albumin: 3 g/dL — ABNORMAL LOW (ref 3.5–5.0)
Alkaline Phosphatase: 92 U/L (ref 38–126)
Anion gap: 12 (ref 5–15)
BUN: 40 mg/dL — ABNORMAL HIGH (ref 8–23)
CO2: 33 mmol/L — ABNORMAL HIGH (ref 22–32)
Calcium: 9.3 mg/dL (ref 8.9–10.3)
Chloride: 101 mmol/L (ref 98–111)
Creatinine, Ser: 1.4 mg/dL — ABNORMAL HIGH (ref 0.44–1.00)
GFR calc Af Amer: 40 mL/min — ABNORMAL LOW (ref >60)
GFR calc non Af Amer: 35 mL/min — ABNORMAL LOW (ref >60)
Glucose, Bld: 166 mg/dL — ABNORMAL HIGH (ref 70–99)
Potassium: 4.9 mmol/L (ref 3.5–5.1)
Sodium: 146 mmol/L — ABNORMAL HIGH (ref 135–145)
Total Bilirubin: 0.8 mg/dL (ref 0.3–1.2)
Total Protein: 6.1 g/dL — ABNORMAL LOW (ref 6.5–8.1)

## 2019-04-17 LAB — URINE CULTURE: Culture: NO GROWTH

## 2019-04-17 LAB — GLUCOSE, CAPILLARY
Glucose-Capillary: 159 mg/dL — ABNORMAL HIGH (ref 70–99)
Glucose-Capillary: 173 mg/dL — ABNORMAL HIGH (ref 70–99)
Glucose-Capillary: 205 mg/dL — ABNORMAL HIGH (ref 70–99)

## 2019-04-17 LAB — MAGNESIUM: Magnesium: 1.9 mg/dL (ref 1.7–2.4)

## 2019-04-17 LAB — PROCALCITONIN: Procalcitonin: <0.1 ng/mL

## 2019-04-17 LAB — FERRITIN: Ferritin: 25 ng/mL (ref 11–307)

## 2019-04-17 MED ORDER — HALOPERIDOL LACTATE 5 MG/ML IJ SOLN
2.0000 mg | Freq: Four times a day (QID) | INTRAMUSCULAR | Status: DC | PRN
  Administered 2019-04-17 (×3): 2 mg via INTRAVENOUS
  Filled 2019-04-17 (×3): qty 1

## 2019-04-17 MED ORDER — METOPROLOL TARTRATE 5 MG/5ML IV SOLN
2.5000 mg | Freq: Once | INTRAVENOUS | Status: AC
  Administered 2019-04-17: 2.5 mg via INTRAVENOUS

## 2019-04-17 MED ORDER — SODIUM CHLORIDE 0.9% FLUSH: 10.0000 mL | INTRAVENOUS | Status: DC | PRN

## 2019-04-17 MED ORDER — IPRATROPIUM-ALBUTEROL 0.5-2.5 (3) MG/3ML IN SOLN
3.0000 mL | Freq: Four times a day (QID) | RESPIRATORY_TRACT | Status: DC
  Administered 2019-04-17 (×3): 3 mL via RESPIRATORY_TRACT
  Filled 2019-04-17 (×3): qty 3

## 2019-04-17 MED ORDER — METOPROLOL TARTRATE 5 MG/5ML IV SOLN
2.5000 mg | Freq: Four times a day (QID) | INTRAVENOUS | Status: DC
  Administered 2019-04-17 – 2019-04-18 (×5): 2.5 mg via INTRAVENOUS
  Filled 2019-04-17 (×6): qty 5

## 2019-04-17 MED ORDER — HALOPERIDOL LACTATE 5 MG/ML IJ SOLN
1.0000 mg | Freq: Four times a day (QID) | INTRAMUSCULAR | Status: DC | PRN
  Administered 2019-04-17: 1 mg via INTRAVENOUS
  Filled 2019-04-17: qty 1

## 2019-04-17 MED ORDER — PANTOPRAZOLE SODIUM 40 MG IV SOLR
40.0000 mg | INTRAVENOUS | Status: DC
  Administered 2019-04-17 – 2019-04-20 (×4): 40 mg via INTRAVENOUS
  Filled 2019-04-17 (×4): qty 40

## 2019-04-17 MED ORDER — BUDESONIDE 0.5 MG/2ML IN SUSP
0.5000 mg | Freq: Two times a day (BID) | RESPIRATORY_TRACT | Status: DC
  Administered 2019-04-17 – 2019-04-20 (×6): 0.5 mg via RESPIRATORY_TRACT
  Filled 2019-04-17 (×6): qty 2

## 2019-04-17 MED ORDER — FUROSEMIDE 10 MG/ML IJ SOLN
40.0000 mg | Freq: Once | INTRAMUSCULAR | Status: AC
  Administered 2019-04-17: 40 mg via INTRAVENOUS
  Filled 2019-04-17: qty 4

## 2019-04-17 MED ORDER — MORPHINE SULFATE (PF) 2 MG/ML IV SOLN
1.0000 mg | INTRAVENOUS | Status: DC | PRN
  Administered 2019-04-17 – 2019-04-18 (×2): 1 mg via INTRAVENOUS
  Filled 2019-04-17 (×2): qty 1

## 2019-04-17 MED ORDER — SODIUM CHLORIDE 0.9% FLUSH
10.0000 mL | Freq: Two times a day (BID) | INTRAVENOUS | Status: DC
  Administered 2019-04-17 – 2019-04-21 (×8): 10 mL

## 2019-04-17 NOTE — Progress Notes (Signed)
Pt is yelling from her bed that her ankle hurts. HR is fluctuating from 120's-160's+ at times, but not sustaining and BP is 163/103, RR 18-20. Administered 0.5 mg of morphine and will continue to monitor patient.

## 2019-04-17 NOTE — Progress Notes (Addendum)
PROGRESS NOTE  Becky Newton KVQ:[id number] DOB: May 17, 1933 DOA: 04/20/2019 PCP: [name], NP  Brief History:  86 year old female with a history of COPD, chronic respiratory failure on 6 L, paroxysmal atrial fibrillation, diastolic CHF, OSA, diabetes mellitus type 2 presenting with a mechanical fall and left ankle fracture.  The patient is unable to provide history secondary to her encephalopathy.  All the history is obtained from review of the medical record.  According to the patient's spouse, the patient went to urgent care in the early morning of 04/18/2019 for right hip/leg pain.  The patient was prescribed oxycodone and Robaxin.  He subsequently drove her home and the patient took her opioids.  Around 2 PM, the patient woke up confused and somewhat lethargic.  Her spouse waited for period of time until the patient was more alert.  He helped her transfer to a walker, but she sustained a mechanical fall during the transfer resulting in left leg pain.  She cannot get up.  EMS was activated.  In the emergency department, the patient was afebrile hemodynamically stable initially on 6 L.  She was confused.  X-ray showed a bimalleolar fracture of her left ankle.  Orthopedics, Dr. Doran Durand was consulted.  He recommended transfer to [facility] as well as reduction and splint placement.  Patient became increasingly hypoxic and was placed on BiPAP initially.  She was given morphine in preparation for her reduction and splint placement.  Subsequently, the patient became somnolent.  Repeat ABG showed 7.035/> 120/227 on 100%.  Chest x-ray showed small bilateral effusions with chronic left basilar consolidation.  Because of the patient's respiratory failure and requirement for BiPAP, she was not stable for transfer to [facility].  I contacted orthopedics, Dr. Doran Durand once again.  He stated that the patient can be maintained at [facility] for now and remain in her splint with nonweightbearing.  She can  follow up with him in the office in 1 to 2 weeks after discharge. Since admission, the patient became more alert, but remained confused and intermittently agitated.  Attempts made to wean off BiPAP, but patient desaturates quickly.  Pulmonary medicine was consulted to assist.  Assessment/Plan: Acute on chronic respiratory failure with hypoxia and hypercarbia -multifactorial including COPD, fluid overload, OSA/OHS, pulmonary HTN, diaphragmatic paralysis and hypoventilation from opioids -Normally maintained on 6 L nasal cannula at home -Remains on BiPAP--difficulty weaning -consult pulmoary -start duonebs  Hyperkalemia -Kayexalate-bicarb-D50/IV insulin-calcium gluconate were given -improved  Acute on chronic renal failure--CKD 3a -baseline creatinine 0.8-1.1 -serum creatinine peak 1.48  Bimalleolar fracture left ankle -I discussed case with orthopedics, Dr. Lawson Radar made him aware of the patient's instability for transfer down to [facility] -After discussion with Dr. Johnston Ebbs to keep patient at [facility] with  splint, nonweightbearing   COPD -Start duonebs -start pulmicort  Paroxysmal atrial fibrillation -Presently sinus rhythm -on apixaban at home -unable to take po due to BiPAP-->IV lopressor  Acute metabolic encephalopathy -Secondary to opioids as well as infectious process and hypercarbia -UA neg for pyuria  Diabetes mellitus type 2 -NovoLog sliding scale -Hemoglobin A1c--6.3  Iron deficiency anemia -04/01/2019 iron saturation 5%, ferritin 14 -Plan to give IV iron once the patient stabilized  Morbid Obesity -BMI 40.43 -lifestyle modification  OSA/OHS -noncompliant with CPAP at home  Acute on Chronic diastolic CHF -daily weights -redose lasix IV x 1 today      Disposition Plan: Patient From: Home D/C Place: SNF when able to  wean off and remain stable off BiPAP Barriers: Not Clinically Stable--BiPAP dependent  Family Communication:  Son  updated  Consultants:  pulmonary  Code Status:  DNR  DVT Prophylaxis:  IV heparin   Procedures: As Listed in Progress Note Above  Antibiotics: Zosyn 3/25>>>3/26 azithro 3/25>>>    The patient is critically ill with multiple organ systems failure and requires high complexity decision making for assessment and support, frequent evaluation and titration of therapies, application of advanced monitoring technologies and extensive interpretation of multiple databases.  Critical care time - 40 mins.    Subjective: Pt remains confused and intermittently agitated.  Denies cp, abd pain.  Remainder ROS unobtainable due to encephalopathy.  Continues to desat off bipap.  No vomiting or diarrhea  Objective: Vitals:   04/17/19 0500 04/17/19 0700 04/17/19 0717 04/17/19 0723  BP: (!) 143/70 122/74    Pulse: (!) 109 (!) 122 (!) 119   Resp: (!) 30 (!) 30 (!) 22   Temp:    98.8 F (37.1 C)  TempSrc:    Axillary  SpO2: 96% 96% 93%   Weight: 100.6 kg     Height:        Intake/Output Summary (Last 24 hours) at 04/17/2019 0801 Last data filed at 04/17/2019 0500 Gross per 24 hour  Intake 383.72 ml  Output 1500 ml  Net -1116.28 ml   Weight change: 3.83 kg Exam:   General:  Pt is alert, follows commands appropriately, not in acute distress; confuesed  HEENT: No icterus, No thrush, No neck mass, [place]/AT  Cardiovascular: RRR, S1/S2, no rubs, no gallops  Respiratory: diminished breath sounds.  Bibasilar rales  Abdomen: Soft/+BS, non tender, non distended, no guarding  Extremities: 1+LE edema, No lymphangitis, No petechiae, No rashes, no synovitis   Data Reviewed: I have personally reviewed following labs and imaging studies Basic Metabolic Panel: Recent Labs  Lab 04/14/2019 1853 04/16/19 0313 04/16/19 0751 04/16/19 1527 04/17/19 0434  NA 141 139 141 144 146*  K 5.4* 6.2* 6.5* 5.4* 4.9  CL 101 101 101 100 101  CO2 32 32 31 35* 33*  GLUCOSE 155* 228* 230* 191* 166*  BUN 27*  29* 32* 36* 40*  CREATININE 1.07* 1.34* 1.48* 1.51* 1.40*  CALCIUM 9.2 9.0 9.4 9.5 9.3  MG  --   --   --   --  1.9   Liver Function Tests: Recent Labs  Lab 04/07/2019 1853 04/17/19 0434  AST 12* 19  ALT 12 20  ALKPHOS 91 92  BILITOT 0.4 0.8  PROT 6.5 6.1*  ALBUMIN 3.2* 3.0*   No results for input(s): LIPASE, AMYLASE in the last 168 hours. No results for input(s): AMMONIA in the last 168 hours. Coagulation Profile: No results for input(s): INR, PROTIME in the last 168 hours. CBC: Recent Labs  Lab 04/02/2019 1853 04/16/19 0313 04/17/19 0434 04/17/19 0712  WBC 13.6* 19.0* 13.4* 14.9*  NEUTROABS 11.3*  --   --  PENDING  HGB 9.6* 9.6* 8.1* 8.2*  HCT 36.3 37.3 29.8* 29.2*  MCV 91.7 93.5 88.2 87.4  PLT 279 330 215 248   Cardiac Enzymes: No results for input(s): CKTOTAL, CKMB, CKMBINDEX, TROPONINI in the last 168 hours. BNP: Invalid input(s): POCBNP CBG: Recent Labs  Lab 04/16/19 0605 04/16/19 1113 04/16/19 1636 04/16/19 2358 04/17/19 0530  GLUCAP 200* 252* 150* 152* 159*   HbA1C: Recent Labs    04/16/2019 2105 04/16/19 0751  HGBA1C 6.3* 6.3*   Urine analysis:    Component Value Date/Time  COLORURINE YELLOW 04/16/2019 [place] 04/16/2019 1224   LABSPEC 1.021 04/16/2019 1224   PHURINE 5.0 04/16/2019 1224   GLUCOSEU NEGATIVE 04/16/2019 1224   HGBUR NEGATIVE 04/16/2019 1224   BILIRUBINUR NEGATIVE 04/16/2019 1224   KETONESUR NEGATIVE 04/16/2019 1224   PROTEINUR NEGATIVE 04/16/2019 1224   NITRITE NEGATIVE 04/16/2019 1224   LEUKOCYTESUR NEGATIVE 04/16/2019 1224   Sepsis Labs: _0 (procalcitonin:4,lacticidven:4) ) Recent Results (from the past 240 hour(s))  SARS CORONAVIRUS 2 ([place] 6-24 HRS) Nasopharyngeal Nasopharyngeal Swab     Status: None   Collection Time: 04/11/2019  7:54 PM   Specimen: Nasopharyngeal Swab  Result Value Ref Range Status   SARS Coronavirus 2 NEGATIVE NEGATIVE Final    Comment: (NOTE) SARS-CoV-2 target nucleic acids  are NOT DETECTED. The SARS-CoV-2 RNA is generally detectable in upper and lower respiratory specimens during the acute phase of infection. Negative results do not preclude SARS-CoV-2 infection, do not rule out co-infections with other pathogens, and should not be used as the sole basis for treatment or other patient management decisions. Negative results must be combined with clinical observations, patient history, and epidemiological information. The expected result is Negative. Fact Sheet for Patients: SugarRoll.be Fact Sheet for Healthcare Providers: https://www.woods-mathews.com/ This test is not yet approved or cleared by the [place] FDA and  has been authorized for detection and/or diagnosis of SARS-CoV-2 by FDA under an Emergency Use Authorization (EUA). This EUA will remain  in effect (meaning this test can be used) for the duration of the COVID-19 declaration under Section 56 4(b)(1) of the Act, 21 U.S.C. section 360bbb-3(b)(1), unless the authorization is terminated or revoked sooner. Performed at [facility], [place] [address], [place], [place] [address]   Respiratory Panel by RT PCR (Flu A&B, Covid) - Nasopharyngeal Swab     Status: None   Collection Time: 03/25/2019 10:36 PM   Specimen: Nasopharyngeal Swab  Result Value Ref Range Status   SARS Coronavirus 2 by RT PCR NEGATIVE NEGATIVE Final    Comment: (NOTE) SARS-CoV-2 target nucleic acids are NOT DETECTED. The SARS-CoV-2 RNA is generally detectable in upper respiratoy specimens during the acute phase of infection. The lowest concentration of SARS-CoV-2 viral copies this assay can detect is 131 copies/mL. A negative result does not preclude SARS-Cov-2 infection and should not be used as the sole basis for treatment or other patient management decisions. A negative result may occur with  improper specimen collection/handling, submission of specimen other than  nasopharyngeal swab, presence of viral mutation(s) within the areas targeted by this assay, and inadequate number of viral copies (<131 copies/mL). A negative result must be combined with clinical observations, patient history, and epidemiological information. The expected result is Negative. Fact Sheet for Patients:  PinkCheek.be Fact Sheet for Healthcare Providers:  GravelBags.it This test is not yet ap proved or cleared by the [place] FDA and  has been authorized for detection and/or diagnosis of SARS-CoV-2 by FDA under an Emergency Use Authorization (EUA). This EUA will remain  in effect (meaning this test can be used) for the duration of the COVID-19 declaration under Section 564(b)(1) of the Act, 21 U.S.C. section 360bbb-3(b)(1), unless the authorization is terminated or revoked sooner.    Influenza A by PCR NEGATIVE NEGATIVE Final   Influenza B by PCR NEGATIVE NEGATIVE Final    Comment: (NOTE) The Xpert Xpress SARS-CoV-2/FLU/RSV assay is intended as an aid in  the diagnosis of influenza from Nasopharyngeal swab specimens and  should not be used as  a sole basis for treatment. Nasal washings and  aspirates are unacceptable for Xpert Xpress SARS-CoV-2/FLU/RSV  testing. Fact Sheet for Patients: PinkCheek.be Fact Sheet for Healthcare Providers: GravelBags.it This test is not yet approved or cleared by the [place] FDA and  has been authorized for detection and/or diagnosis of SARS-CoV-2 by  FDA under an Emergency Use Authorization (EUA). This EUA will remain  in effect (meaning this test can be used) for the duration of the  Covid-19 declaration under Section 564(b)(1) of the Act, 21  U.S.C. section 360bbb-3(b)(1), unless the authorization is  terminated or revoked. Performed at [facility], [address], [place], [place] [address]   Culture, blood  (Routine X 2) w Reflex to ID Panel     Status: None (Preliminary result)   Collection Time: 04/16/19  9:49 AM   Specimen: BLOOD LEFT HAND  Result Value Ref Range Status   Specimen Description BLOOD LEFT HAND  Final   Special Requests   Final    BOTTLES DRAWN AEROBIC AND ANAEROBIC Blood Culture results may not be optimal due to an inadequate volume of blood received in culture bottles Performed at [facility], [address] [place], [place] [address]    Culture PENDING  Incomplete   Report Status PENDING  Incomplete  MRSA PCR Screening     Status: None   Collection Time: 04/16/19 10:34 AM   Specimen: Nasopharyngeal  Result Value Ref Range Status   MRSA by PCR NEGATIVE NEGATIVE Final    Comment:        The GeneXpert MRSA Assay (FDA approved for NASAL specimens only), is one component of a comprehensive MRSA colonization surveillance program. It is not intended to diagnose MRSA infection nor to guide or monitor treatment for MRSA infections. Performed at [facility], [address] [place], [place] [address]      Scheduled Meds: . budesonide (PULMICORT) nebulizer solution  0.5 mg Nebulization BID  . chlorhexidine  15 mL Mouth Rinse BID  . Chlorhexidine Gluconate Cloth  6 each Topical Daily  . enoxaparin (LOVENOX) injection  40 mg Subcutaneous Q24H  . furosemide  20 mg Oral BID  . insulin aspart  0-9 Units Subcutaneous Q6H  . ipratropium-albuterol  3 mL Nebulization Q6H  . latanoprost  1 drop Both Eyes QHS  . mouth rinse  15 mL Mouth Rinse q12n4p  . methylPREDNISolone (SOLU-MEDROL) injection  40 mg Intravenous Q12H  . metoprolol succinate  25 mg Oral Daily  . nystatin   Topical BID  . sodium chloride flush  3 mL Intravenous Q12H   Continuous Infusions: . sodium chloride    . azithromycin 500 mg (04/17/19 0737)  . piperacillin-tazobactam (ZOSYN)  IV 3.375 g (04/17/19 8527)    Procedures/Studies: DG Ankle 2 Views Left  Result Date: 04/03/2019 CLINICAL DATA:   Bimalleolar ankle fracture, reduction EXAM: LEFT ANKLE - 2 VIEW COMPARISON:  03/23/2019 FINDINGS: Frontal and lateral views of the left ankle demonstrate reduction of the bimalleolar ankle fracture seen previously, with near anatomic alignment of the medial and lateral malleoli. The talus is now aligned anatomic [place]a with the tibial plafond. Diffuse soft tissue edema. IMPRESSION: 1. Near anatomic alignment of the bimalleolar fracture seen previously. 2. Anatomic alignment of the tibiotalar joint. Electronically Signed   By: Randa Ngo M.D.   On: 04/09/2019 23:11   DG Ankle Complete Left  Result Date: 03/27/2019 CLINICAL DATA:  [place], pain and bruising, visible deformity EXAM: LEFT ANKLE COMPLETE - 3+ VIEW COMPARISON:  None. FINDINGS: Frontal and cross-table lateral views of the left ankle are obtained. Mildly comminuted oblique fracture of the distal fibular diaphysis is noted. There is a comminuted transverse fracture of the medial malleolus. There is lateral displacement of the medial and lateral malleolar fracture fragments, with severe lateral subluxation of the talus relative to the tibial plafond. There is diffuse soft tissue edema. IMPRESSION: 1. Bimalleolar fracture with severe lateral subluxation of the talus. Electronically Signed   By: Randa Ngo M.D.   On: 04/19/2019 18:54   CT Head Wo Contrast  Result Date: 03/26/2019 CLINICAL DATA:  86 year old female status post syncope and fall. EXAM: CT HEAD WITHOUT CONTRAST TECHNIQUE: Contiguous axial images were obtained from the base of the skull through the vertex without intravenous contrast. COMPARISON:  None. FINDINGS: Brain: Cerebral volume is within normal limits for age. No midline shift, ventriculomegaly, mass effect, evidence of mass lesion, intracranial hemorrhage or evidence of cortically based acute infarction. Possible small focus of subependymal gray matter heterotopia at the left lateral ventricle on series 2, image 15. Elsewhere  gray-white matter differentiation appears normal for age. Vascular: Calcified atherosclerosis at the skull base. No suspicious intracranial vascular hyperdensity. Skull: Negative, no skull fracture identified. Sinuses/Orbits: Bilateral mastoid effusions and middle ear opacification, appears to be inflammatory. Negative visible nasopharynx. Visible paranasal sinuses are clear. Other: No acute orbit or scalp soft tissue injury identified. IMPRESSION: 1. No acute intracranial abnormality or acute traumatic injury identified. 2. Bilateral middle ear and mastoid opacification appears to be inflammatory. Consider otitis media or cholesteatoma. ENT follow-up may be valuable. Electronically Signed   By: Genevie Ann M.D.   On: 03/24/2019 21:42   CT Cervical Spine Wo Contrast  Result Date: 03/30/2019 CLINICAL DATA:  86 year old female status post syncope and fall. EXAM: CT CERVICAL SPINE WITHOUT CONTRAST TECHNIQUE: Multidetector CT imaging of the cervical spine was performed without intravenous contrast. Multiplanar CT image reconstructions were also generated. COMPARISON:  Head CT today reported separately. Chest CT 08/08/2017. FINDINGS: Alignment: Mild reversal of cervical lordosis. Subtle anterolisthesis at C3-C4 and C4-C5 appears to be degenerative in nature with associated facet arthropathy. Cervicothoracic junction alignment is within normal limits. Bilateral posterior element alignment is within normal limits. Skull base and vertebrae: Visualized skull base is intact. No atlanto-occipital dissociation. No acute osseous abnormality identified. Mild spina bifida occulta at C6 (normal variant). Soft tissues and spinal canal: No prevertebral fluid or swelling. No visible canal hematoma. Negative noncontrast neck soft tissues, partially retropharyngeal carotids. Disc levels: Widespread bilateral cervical facet arthropathy. Comparatively mild chronic disc and endplate degeneration. No significant spinal stenosis suspected.  Upper chest: Visible upper thoracic levels appear intact. There is confluent new lung opacity in the left apex on series 4, image 72 when compared to the 2019 chest CT. This does resemble atelectasis. Negative visible superior mediastinum. IMPRESSION: 1. No acute traumatic injury identified in the cervical spine. 2. New nonspecific left apical lung opacity (series 4, image 72) when compared to a 2019 Chest CT. Query cough. Consider a repeat Chest CT (noncontrast may suffice) in 2-3 months to re-evaluate. Electronically Signed   By: Genevie Ann M.D.   On: 03/25/2019 21:48   DG Chest Portable 1 View  Result Date: 04/08/2019 CLINICAL DATA:  Shortness of breath, left ankle fracture EXAM: PORTABLE CHEST 1 VIEW COMPARISON:  10/28/2018 FINDINGS: Single frontal view of the chest demonstrates persistent enlargement of the cardiac silhouette. There is chronic central vascular congestion, with chronic left basilar consolidation unchanged. Small bilateral pleural  effusions versus pleural thickening, left greater than right, again noted. No pneumothorax. No acute bony abnormality. IMPRESSION: 1. Chronic vascular congestion. 2. Chronic left basilar consolidation compatible with atelectasis or scarring. 3. Stable small bilateral pleural effusions versus pleural thickening. Electronically Signed   By: Randa Ngo M.D.   On: 04/09/2019 23:12    Orson Eva, DO  [facility]  If 7PM-7AM, please contact night-coverage [email or web address] Password [email or web address] 04/17/2019, 8:01 AM   LOS: 1 day

## 2019-04-17 NOTE — Telephone Encounter (Signed)
Lauren with [facility], would like to know how often the pt is checking her sugars a day?  Lauren's [phone]

## 2019-04-17 NOTE — Consult Note (Addendum)
PULMONARY / CRITICAL CARE MEDICINE   NAME:  Becky Newton, MRN:  [id number], DOB:  02/28/1933, LOS: 1 ADMISSION DATE:  03/30/2019, CONSULTATION DATE:  3/26  REFERRING MD:  [facility] , CHIEF COMPLAINT:  Dyspnea/ desats   BRIEF HISTORY:     88 yowf with minimal smoking hx under care of [facility]/ [facility] with chronic hypoxemic and hypercabic resp failure c/b WHO III Pulmonary Hypertension (cor pulmonale) presumably on basis of copd/ obesty and paralyzed Left HD (s/p plication)  and refusing bipap/ with baseline doe = MMRC4  = sob if tries to leave home or while getting dressed admit 3/24 with L foot fx/dislocation reduced/ splinted in er and admit when refused transfer to [facility]  >  bipap dep resp failure   so PCCM service consulted am 3/26.  Last Seen in our office by Dr Ernest Mallick c/o sob "most of the time" even at hs despite sitting propped up on sleep number bed and 6lpm 24/7. Also c/o leg swelling better on lasix and some better with prn neb.   No obvious day to day or daytime variability or assoc excess/ purulent sputum or mucus plugs or hemoptysis or cp or chest tightness, subjective wheeze or overt sinus or hb symptoms.    .Also denies any obvious fluctuation of symptoms with weather or environmental changes or other aggravating or alleviating factors except as outlined above   No unusual exposure hx or h/o childhood pna/ asthma or knowledge of premature birth.  Current Allergies, Complete Past Medical History, Past Surgical History, Family History, and Social History were reviewed in [employer] record.  ROS  The following are not active complaints unless bolded Hoarseness, sore throat, dysphagia, dental problems, itching, sneezing,  nasal congestion or discharge of excess mucus or purulent secretions, ear ache,   fever, chills, sweats, unintended wt loss or wt gain, classically pleuritic or exertional cp,  orthopnea pnd or arm/hand swelling  or leg  swelling, presyncope, palpitations, abdominal pain, anorexia, nausea, vomiting, diarrhea  or change in bowel habits or change in bladder habits, change in stools or change in urine, dysuria, hematuria,  rash, arthralgias, visual complaints, headache, numbness, gen weakness or ataxia or problems with walking or coordination,  change in mood or  memory.             SIGNIFICANT PAST MEDICAL HISTORY   PAF on eliquis, not on amiodarone / followed by [facility]  SIGNIFICANT EVENTS:  Admit 3/24 with bimalleolar L ankle fx > reduced/splinted only   STUDIES:   Echo 3/26: 1. Left ventricular ejection fraction, by estimation, is 55 to 60%. The  left ventricle has normal function. The left ventricle has no regional  wall motion abnormalities. Left ventricular diastolic parameters are  indeterminate.  2. There is flattening of the ventricular septum in systole consistent  with RV pressure overload. . Right ventricular systolic function is mildly  reduced. The right ventricular size is moderately enlarged. There is  mildly elevated pulmonary artery  systolic pressure.  3. Left atrial size was mild to moderately dilated.  4. Right atrial size was mildly dilated.   CULTURES:  Covid  PCR 3/24 neg MRSA PCR 3/25 > neg   Urince culture  3/25 neg BC x 2 3/25 >>>    ANTIBIOTICS:  Zosyn 3/25 >>> Zmax  IV 3/25 >>>  LINES/TUBES:    CONSULTANTS:  PCCM   SUBJECTIVE:  Looks uncomfortable but not verbalizing specific complaints  CONSTITUTIONAL: BP (!) 160/82  Pulse (!) 111   Temp 98.5 F (36.9 C) (Axillary)   Resp 20   Ht _0  (1.549 m)   Wt 100.6 kg   SpO2 92%   BMI 41.91 kg/m    Intake/Output Summary (Last 24 hours) at 04/17/2019 1302 Last data filed at 04/17/2019 1233 Gross per 24 hour  Intake 383.72 ml  Output 1500 ml  Net -1116.28 ml        FiO2 (%):  [45 %-60 %] 60 %  PHYSICAL EXAM: Tmax 98.9 (no change) General:  Acute and chronically ill appearing elderly moderate  increase wob on NP x 6lpm  Neuro:  Knows name/ follows simple commands  HEENT:  Mucosa dry Cardiovascular:  RRR SR at present  Lungs:  Distant bs s wheeze, dullness L base Abdomen:  Obese and soft/ poor excursion Musculoskeletal:  L foot in splint/ no deformities or apparent calf tenderness Skin:  No rash  And no skin breakdown per nursing    EKG 3/25  NSR with RVH pattern  pCXR 3/26 1. Left mainstem bronchus truncation with prominent left base atelectasis and consolidation. Moderate left pleural effusion. 2.  Cardiomegaly with pulmonary venous congestion. My impression:  Doubt effusion but there is vol loss L lung c/w atx/ mucus plug     ASSESSMENT AND PLAN   1) acute on chronic hypoxemic/hypercarbic resp failure multifactorial  - baseline HC03 04/01/19 = 35  translates to pC02 of 58  - acute change this admit = atx LLL due to recumbency/ Paralyzed/plicated L HD and narc need to control pain reduce inspiratory capacity/ cough effectiveness,   Likely baseline = combination of copd/ obesity/ paralyzed/plicated  L HD but really nothing easily  reversible and severe baseline chronic sob and high 02 dep/ approp already NCB as has refused bipap in past  >>> check tsh to be complete though note nl 09/2018 / bipap as she'll permit and up in chair as much as possible when feasible  >>> at risk of fat embolism syndrome but not evidence to support at this point x for enceph >>> not a candidate for FOB unless intubated first,which she has declined   >>> if deteriorates despite max for for copd would move quickly to a palliative approach in this setting  Though somewhat paradoxic, when the lungs fail to clear C02 properly and pC02 rises the lungs then become  a more efficient scavenger of C02 allowing lower work of breathing and  better C02 clearance albeit at a higher serum pC02 level - this is why pts can look a lot better than their ABG's would suggest and why it's so difficult to prognosticate  endstage dz.  It's also why I strongly rec DNI status (ventilating pts down to a nl pC02 adversely affects this compensatory mechanism)  >>> d/c bipap at her/ fm request    2) Mild hypernatremia/ pre-renal azotemia suggests she's now intravascularly dry so no further diuresis suggested   3) Anemia ? If bled into fx's    Lab Results  Component Value Date   HGB 8.2 (L) 04/17/2019   HGB 8.1 (L) 04/17/2019   HGB 9.6 (L) 04/16/2019    >>> transfusion threshold might be increased to less than 8 in a setting of severe 02 dep but is a judgement call  (vs palliative rx) and caution with lovenox in this setting  4) acute metabolic encephalopathy, was present on admit per Husband, likely from narcs and hypercarbia. - emphasized this is not painful from pt's perspective,  observe to be sure she doesn't pull out IV's but nothing to offer other than minimize sedating meds/narcs as much as you can balanced against comfort needs best addressed by [facility]    LABS  Glucose Recent Labs  Lab 04/16/19 0605 04/16/19 1113 04/16/19 1636 04/16/19 2358 04/17/19 0530 04/17/19 1057  GLUCAP 200* 252* 150* 152* 159* 173*    BMET Recent Labs  Lab 04/16/19 0751 04/16/19 1527 04/17/19 0434  NA 141 144 146*  K 6.5* 5.4* 4.9  CL 101 100 101  CO2 31 35* 33*  BUN 32* 36* 40*  CREATININE 1.48* 1.51* 1.40*  GLUCOSE 230* 191* 166*    Liver Enzymes Recent Labs  Lab 03/23/2019 1853 04/17/19 0434  AST 12* 19  ALT 12 20  ALKPHOS 91 92  BILITOT 0.4 0.8  ALBUMIN 3.2* 3.0*    Electrolytes Recent Labs  Lab 04/16/19 0751 04/16/19 1527 04/17/19 0434  CALCIUM 9.4 9.5 9.3  MG  --   --  1.9    CBC Recent Labs  Lab 04/16/19 0313 04/17/19 0434 04/17/19 0712  WBC 19.0* 13.4* 14.9*  HGB 9.6* 8.1* 8.2*  HCT 37.3 29.8* 29.2*  PLT 330 215 248    ABG Recent Labs  Lab 04/16/19 0428 04/16/19 0726 04/17/19 0350  PHART 7.035* 7.203* 7.306*  PCO2ART >120* 86.3* 74.3*  PO2ART 227* 65.1* 91.1     Coag's No results for input(s): APTT, INR in the last 168 hours.  Sepsis Markers Recent Labs  Lab 04/16/19 0751 04/17/19 0712  PROCALCITON <0.10 <0.10    Cardiac Enzymes No results for input(s): TROPONINI, PROBNP in the last 168 hours.  PAST MEDICAL HISTORY :   She  has a past medical history of A-fib [facility]), Atrial fibrillation ([place]), Chicken pox, COPD (chronic obstructive pulmonary disease) ([place]), Diabetes ([place]), DNR (do not resuscitate) (2020), Fibrocystic breast determined by biopsy (1977), GERD (gastroesophageal reflux disease), Glaucoma, H/O hernia repair (2006), H/O left breast biopsy (1982), Incisional hernia, Lung disease, S/P scar revision, and Uterine cancer ([place]).  PAST SURGICAL HISTORY:  She  has a past surgical history that includes Hiatal hernia repair (1966); Appendectomy (1966); Total abdominal hysterectomy w/ bilateral salpingoophorectomy (1974); Cataract extraction (6195,0932); Hernia repair (2006); Nissen fundoplication; Cholecystectomy (1975); and Breast biopsy.  Allergies  Allergen Reactions  . Ivp Dye [Iodinated Diagnostic Agents] Nausea And Vomiting    No current facility-administered medications on file prior to encounter.   Current Outpatient Medications on File Prior to Encounter  Medication Sig  . albuterol (VENTOLIN HFA) 108 (90 Base) MCG/ACT inhaler Inhale 2 puffs into the lungs every 6 (six) hours as needed for wheezing or shortness of breath.  Marland Kitchen apixaban (ELIQUIS) 5 MG TABS tablet Take 1 tablet (5 mg total) by mouth 2 (two) times daily.  . cholecalciferol (VITAMIN D) 1000 units tablet Take 1,000 Units by mouth at bedtime.   Marland Kitchen diltiazem (CARDIZEM CD) 120 MG 24 hr capsule Take 1 capsule (120 mg total) by mouth daily as needed (as needed for Atrial Fibrillation).  Marland Kitchen diltiazem (CARDIZEM CD) 240 MG 24 hr capsule TAKE 1 CAPSULE (240 MG TOTAL) BY MOUTH DAILY.  . ferrous sulfate (FERROUSUL) 325 (65 FE) MG tablet Take 1 tablet (325 mg total) by mouth 2  (two) times daily with a meal.  . furosemide (LASIX) 20 MG tablet Take 1 tablet (20 mg total) by mouth 2 (two) times daily.  . Glucosamine-Chondroit-Vit C-Mn (GLUCOSAMINE CHONDR 500 COMPLEX PO) Take 1 tablet by mouth daily.   Marland Kitchen ipratropium-albuterol (  DUONEB) 0.5-2.5 (3) MG/3ML SOLN Take 3 mLs by nebulization every 4 (four) hours as needed.  . latanoprost (XALATAN) 0.005 % ophthalmic solution Place 1 drop into both eyes at bedtime.  . metFORMIN (GLUCOPHAGE) 500 MG tablet TAKE 1 TABLET (500 MG TOTAL) BY MOUTH 2 (TWO) TIMES DAILY WITH A MEAL. (Patient taking differently: Take 500 mg by mouth daily with breakfast. D/C when most current prescription is finished)  . metoprolol succinate (TOPROL-XL) 25 MG 24 hr tablet Take 1 tablet (25 mg total) by mouth daily.  . mometasone-formoterol (DULERA) 200-5 MCG/ACT AERO Inhale 2 puffs into the lungs 2 (two) times a day.  . Multiple Vitamins-Minerals (PRESERVISION AREDS PO) Take 1 capsule by mouth 2 (two) times daily. Areds Preservision  . omeprazole (PRILOSEC) 20 MG capsule TAKE 1 CAPSULE (20 MG TOTAL) BY MOUTH TWO TIMES DAILY BEFORE A MEAL.  Marland Kitchen ondansetron (ZOFRAN) 4 MG tablet Take 1 tablet (4 mg total) by mouth every 6 (six) hours as needed for nausea or vomiting.  . OXYGEN Inhale 6.5 L into the lungs.   . polyethylene glycol (MIRALAX) 17 g packet Take 17 g by mouth daily as needed (constipation).  . potassium chloride (K-DUR) 10 MEQ tablet Take 1 tablet (10 mEq total) by mouth daily.  . methylPREDNISolone (MEDROL DOSEPAK) 4 MG TBPK tablet Take as directed (Patient not taking: Reported on 04/06/2019)    FAMILY HISTORY:   Her family history includes Alcohol abuse in her brother; Arthritis in her mother; Breast cancer in her mother; Heart attack in her mother; Heart disease in her mother; Stroke in her mother.  SOCIAL HISTORY:  She  reports that she quit smoking about 29 years ago. Her smoking use included cigarettes. She started smoking about 66 years ago. She  has a 18.00 pack-year smoking history. She has never used smokeless tobacco. She reports that she does not drink alcohol or use drugs.    Fm does not want her care escalated at this point including request no more bipap so I d/c'd it.   The patient is critically ill with multiple organ systems failure and requires high complexity decision making for assessment and support, frequent evaluation and titration of therapies, application of advanced monitoring technologies and extensive interpretation of multiple databases. Critical Care Time devoted to patient care services described in this note is 60 minutes.    Christinia Gully, MD Pulmonary and [place] [phone] After 6:00 PM or weekends, use Beeper [phone]  After 7:00 pm call Elink  [phone]

## 2019-04-17 NOTE — Telephone Encounter (Addendum)
Pt's spouse, Herbie [place], wanted to let her PCP know that she fell yesterday broke her left ankle and was admitted into the hospital. He states that her oxygen level is not too good.

## 2019-04-17 NOTE — Progress Notes (Signed)
Purewick cannister only has 100cc for entire shift. Bladder scan reveals greater than 553cc. MD notified.

## 2019-04-17 NOTE — Care Management Important Message (Signed)
Important Message  Patient Details  Name: Becky Newton MRN: [id number] Date of Birth: 1933-04-28   Medicare Important Message Given:  Yes     Tommy Medal 04/17/2019, 2:35 PM

## 2019-04-17 NOTE — Progress Notes (Signed)
In and out cath produces 450 cc urine. Clear yellow. Patient cleaned and sheets changed. New purewick placed and working. Flexaseal in place and draining brown liquid stool.

## 2019-04-17 NOTE — Progress Notes (Signed)
PICC team arrived to place PICC. Will continue to monitor.

## 2019-04-17 NOTE — Progress Notes (Signed)
Peripherally Inserted Central Catheter Placement  The IV Nurse has discussed with the patient and/or persons authorized to consent for the patient, the purpose of this procedure and the potential benefits and risks involved with this procedure.  The benefits include less needle sticks, lab draws from the catheter, and the patient may be discharged home with the catheter. Risks include, but not limited to, infection, bleeding, blood clot (thrombus formation), and puncture of an artery; nerve damage and irregular heartbeat and possibility to perform a PICC exchange if needed/ordered by physician.  Alternatives to this procedure were also discussed.  Bard Power PICC patient education guide, fact sheet on infection prevention and patient information card has been provided to patient /or left at bedside.    PICC Placement Documentation  PICC Double Lumen 25/18/98 PICC Left Basilic 37 cm 0 cm (Active)  Indication for Insertion or Continuance of Line Poor Vasculature-patient has had multiple peripheral attempts or PIVs lasting less than 24 hours 04/17/19 1840  Exposed Catheter (cm) 0 cm 04/17/19 1840  Site Assessment Clean;Dry;Intact 04/17/19 1840  Lumen #1 Status Flushed;Saline locked;Blood return noted 04/17/19 1840  Lumen #2 Status Flushed;Saline locked;Blood return noted 04/17/19 1840  Dressing Type Transparent;Securing device 04/17/19 1840  Dressing Status Clean;Dry;Intact;Antimicrobial disc in place 04/17/19 1840  Dressing Intervention New dressing 04/17/19 1840  Dressing Change Due 04/24/19 04/17/19 McBride, Allenmichael Mcpartlin 04/17/2019, 6:56 PM

## 2019-04-17 NOTE — Progress Notes (Signed)
Pt pulled oxygen off and O2 levels were in the 60's. Pt became combative when attempting to place nasal cannula; pinching and pulling, smacking nurse. Haldol was given at 2236 with little effect. Midlevel called back with a verbal order for another 2.5 mg of Metoprolol from previous page regarding heart rate. Discussed with midlevel the patients agitation who advised she would look at her chart and make any necessary orders. Pt is in mitts and is now still yet HR is still 160's. Will continue to monitor patient.

## 2019-04-17 NOTE — Progress Notes (Signed)
Pt HR ranging anywhere from 130-180 and will rarely drop into 120's. Next scheduled dose of metoprolol isn't due until 0000. BP is 174/81. Paged midlevel for possible PRN orders.

## 2019-04-17 NOTE — Progress Notes (Signed)
RT attempted to place patient on Heated HIgh Flow nasal cannula; however, patient was confused and pulling off cannula. RT with assistance from RN placed patient back on BIPAP. RT will continue to monitor and assess.

## 2019-04-17 NOTE — Progress Notes (Signed)
*  PRELIMINARY RESULTS* Echocardiogram 2D Echocardiogram has been performed.  Leavy Cella 04/17/2019, 9:07 AM

## 2019-04-18 ENCOUNTER — Inpatient Hospital Stay (HOSPITAL_COMMUNITY): Payer: Medicare HMO

## 2019-04-18 DIAGNOSIS — S82892D Other fracture of left lower leg, subsequent encounter for closed fracture with routine healing: Secondary | ICD-10-CM

## 2019-04-18 DIAGNOSIS — J441 Chronic obstructive pulmonary disease with (acute) exacerbation: Secondary | ICD-10-CM

## 2019-04-18 DIAGNOSIS — I2781 Cor pulmonale (chronic): Secondary | ICD-10-CM

## 2019-04-18 LAB — COMPREHENSIVE METABOLIC PANEL
ALT: 20 U/L (ref 0–44)
AST: 23 U/L (ref 15–41)
Albumin: 3 g/dL — ABNORMAL LOW (ref 3.5–5.0)
Alkaline Phosphatase: 78 U/L (ref 38–126)
Anion gap: 12 (ref 5–15)
BUN: 52 mg/dL — ABNORMAL HIGH (ref 8–23)
CO2: 35 mmol/L — ABNORMAL HIGH (ref 22–32)
Calcium: 9.3 mg/dL (ref 8.9–10.3)
Chloride: 101 mmol/L (ref 98–111)
Creatinine, Ser: 1.31 mg/dL — ABNORMAL HIGH (ref 0.44–1.00)
GFR calc Af Amer: 44 mL/min — ABNORMAL LOW (ref >60)
GFR calc non Af Amer: 38 mL/min — ABNORMAL LOW (ref >60)
Glucose, Bld: 187 mg/dL — ABNORMAL HIGH (ref 70–99)
Potassium: 4.4 mmol/L (ref 3.5–5.1)
Sodium: 148 mmol/L — ABNORMAL HIGH (ref 135–145)
Total Bilirubin: 0.8 mg/dL (ref 0.3–1.2)
Total Protein: 5.8 g/dL — ABNORMAL LOW (ref 6.5–8.1)

## 2019-04-18 LAB — CBC
HCT: 25.7 % — ABNORMAL LOW (ref 36.0–46.0)
Hemoglobin: 7.4 g/dL — ABNORMAL LOW (ref 12.0–15.0)
MCH: 24.6 pg — ABNORMAL LOW (ref 26.0–34.0)
MCHC: 28.8 g/dL — ABNORMAL LOW (ref 30.0–36.0)
MCV: 85.4 fL (ref 80.0–100.0)
Platelets: 240 10*3/uL (ref 150–400)
RBC: 3.01 MIL/uL — ABNORMAL LOW (ref 3.87–5.11)
RDW: 22 % — ABNORMAL HIGH (ref 11.5–15.5)
WBC: 13.4 10*3/uL — ABNORMAL HIGH (ref 4.0–10.5)
nRBC: 0.1 % (ref 0.0–0.2)

## 2019-04-18 LAB — GLUCOSE, CAPILLARY
Glucose-Capillary: 155 mg/dL — ABNORMAL HIGH (ref 70–99)
Glucose-Capillary: 173 mg/dL — ABNORMAL HIGH (ref 70–99)
Glucose-Capillary: 174 mg/dL — ABNORMAL HIGH (ref 70–99)
Glucose-Capillary: 179 mg/dL — ABNORMAL HIGH (ref 70–99)

## 2019-04-18 LAB — BRAIN NATRIURETIC PEPTIDE: B Natriuretic Peptide: 881 pg/mL — ABNORMAL HIGH (ref 0.0–100.0)

## 2019-04-18 LAB — OCCULT BLOOD X 1 CARD TO LAB, STOOL: Fecal Occult Bld: POSITIVE — AB

## 2019-04-18 LAB — TSH: TSH: 1.062 u[IU]/mL (ref 0.350–4.500)

## 2019-04-18 MED ORDER — DIGOXIN 0.25 MG/ML IJ SOLN
0.2500 mg | Freq: Once | INTRAMUSCULAR | Status: AC
  Administered 2019-04-18: 0.25 mg via INTRAVENOUS
  Filled 2019-04-18: qty 2

## 2019-04-18 MED ORDER — IPRATROPIUM BROMIDE 0.02 % IN SOLN
0.5000 mg | Freq: Four times a day (QID) | RESPIRATORY_TRACT | Status: DC
  Administered 2019-04-18 – 2019-04-20 (×4): 0.5 mg via RESPIRATORY_TRACT
  Filled 2019-04-18 (×5): qty 2.5

## 2019-04-18 MED ORDER — LEVALBUTEROL HCL 0.63 MG/3ML IN NEBU
INHALATION_SOLUTION | RESPIRATORY_TRACT | Status: AC
  Administered 2019-04-18: 0.63 mg via RESPIRATORY_TRACT
  Filled 2019-04-18: qty 3

## 2019-04-18 MED ORDER — METOPROLOL TARTRATE 5 MG/5ML IV SOLN
2.5000 mg | Freq: Once | INTRAVENOUS | Status: AC
  Administered 2019-04-18: 2.5 mg via INTRAVENOUS
  Filled 2019-04-18: qty 5

## 2019-04-18 MED ORDER — METOPROLOL TARTRATE 5 MG/5ML IV SOLN
5.0000 mg | Freq: Four times a day (QID) | INTRAVENOUS | Status: DC
  Administered 2019-04-18 – 2019-04-21 (×13): 5 mg via INTRAVENOUS
  Filled 2019-04-18 (×13): qty 5

## 2019-04-18 MED ORDER — SODIUM CHLORIDE 0.9 % IV SOLN
250.0000 mg | Freq: Once | INTRAVENOUS | Status: AC
  Administered 2019-04-18: 250 mg via INTRAVENOUS
  Filled 2019-04-18: qty 20

## 2019-04-18 MED ORDER — DILTIAZEM HCL-DEXTROSE 125-5 MG/125ML-% IV SOLN (PREMIX)
5.0000 mg/h | INTRAVENOUS | Status: DC
  Administered 2019-04-18: 07:00:00 20 mg/h via INTRAVENOUS
  Administered 2019-04-18 (×2): 5 mg/h via INTRAVENOUS
  Filled 2019-04-18 (×3): qty 125

## 2019-04-18 MED ORDER — APIXABAN 5 MG PO TABS
5.0000 mg | ORAL_TABLET | Freq: Two times a day (BID) | ORAL | Status: DC
  Administered 2019-04-18: 09:00:00 5 mg via ORAL
  Filled 2019-04-18 (×4): qty 1

## 2019-04-18 MED ORDER — LEVALBUTEROL HCL 0.63 MG/3ML IN NEBU
0.6300 mg | INHALATION_SOLUTION | Freq: Four times a day (QID) | RESPIRATORY_TRACT | Status: DC
  Administered 2019-04-18 – 2019-04-20 (×4): 0.63 mg via RESPIRATORY_TRACT
  Filled 2019-04-18 (×5): qty 3

## 2019-04-18 MED ORDER — HALOPERIDOL LACTATE 5 MG/ML IJ SOLN
5.0000 mg | Freq: Four times a day (QID) | INTRAMUSCULAR | Status: DC | PRN
  Administered 2019-04-18 – 2019-04-20 (×5): 5 mg via INTRAVENOUS
  Filled 2019-04-18 (×5): qty 1

## 2019-04-18 MED ORDER — DILTIAZEM HCL 25 MG/5ML IV SOLN
5.0000 mg | Freq: Once | INTRAVENOUS | Status: AC
  Administered 2019-04-18: 5 mg via INTRAVENOUS
  Filled 2019-04-18: qty 5

## 2019-04-18 MED ORDER — LORAZEPAM 2 MG/ML IJ SOLN
0.5000 mg | INTRAMUSCULAR | Status: DC | PRN
  Administered 2019-04-18 – 2019-04-21 (×8): 0.5 mg via INTRAVENOUS
  Filled 2019-04-18 (×9): qty 1

## 2019-04-18 MED ORDER — PIPERACILLIN-TAZOBACTAM 3.375 G IVPB
3.3750 g | Freq: Three times a day (TID) | INTRAVENOUS | Status: DC
  Administered 2019-04-18 – 2019-04-21 (×9): 3.375 g via INTRAVENOUS
  Filled 2019-04-18 (×9): qty 50

## 2019-04-18 MED ORDER — IPRATROPIUM BROMIDE 0.02 % IN SOLN
RESPIRATORY_TRACT | Status: AC
  Administered 2019-04-18: 0.5 mg via RESPIRATORY_TRACT
  Filled 2019-04-18: qty 2.5

## 2019-04-18 MED ORDER — DILTIAZEM LOAD VIA INFUSION
5.0000 mg | Freq: Once | INTRAVENOUS | Status: AC
  Administered 2019-04-18: 5 mg via INTRAVENOUS
  Filled 2019-04-18: qty 5

## 2019-04-18 NOTE — Progress Notes (Signed)
Received verbal order from midlevel Kyere to give a 5 mg push  of Cardizem as well as obtain a new EKG. EKG showed A-fib RVR at a rate of 167. Cardizem has been given. Will continue to monitor patient.

## 2019-04-18 NOTE — Progress Notes (Signed)
Pt became agitated and complaining of being hot. Began pulling off gown and leads. Not consolable. HR elevated up to 130's and O2 saturations dropped to 75%. Heated HF had to be increased from 25 L and 50% FiO2 up to 35 L and 70% FiO2 with no improvement in saturations. Temp is 99.1. AC cut on and suppository tylenol administered. Flexiseal removed as pt had some solid stool present. Haldol also administered. Pt repositioned for comfort. Pt currently resting comfortably, temp down to 98.9 and HR decreased back down to 90's . After 15 minutes O2 saturations increased to 95%. Heated HF decreased back down to 30 L and 60% FiO2. Pt currently saturating at 92%

## 2019-04-18 NOTE — Progress Notes (Signed)
Received orders to place foley cath due to urinary retention. Hand hygiene performed prior to insertion. Washed perineal area with warm soap and water. Sterile technique used. Foley care performed post insertion. Pt immediately had 450 mL clear yellow urine output. Perfecto Kingdom RN assisted with placement.

## 2019-04-18 NOTE — Progress Notes (Signed)
Pt has had 200 mL urine output the entire shift. Bladder scan showed greater than 550. Paged MD for orders

## 2019-04-18 NOTE — Progress Notes (Addendum)
Pt on 15 mg/hr of cardizem gtt but HR still sustaining 155-170. Paged Dr. Scherrie November to make him aware. Per MD, increase cardizem gtt to 20 mg/hr and give 0.25 digoxin IV. Also wants respiratory to place patient back on bipap due to possible hypercapnia. Please see notes from Dr. Melvyn Novas; pt family does not want her back on the bipap despite suspected hypercapnia.

## 2019-04-18 NOTE — Progress Notes (Signed)
PROGRESS NOTE  Becky Newton SYS:[id number] DOB: [birth year] DOA: 04/03/2019 PCP: [name], NP  Brief History:  86 year old female with a history of COPD, chronic respiratory failure on 6 L, paroxysmal atrial fibrillation, diastolic CHF, OSA, diabetes mellitus type 2 presenting with a mechanical fall and left ankle fracture. The patient is unable to provide history secondary to her encephalopathy. All the history is obtained from review of the medical record. According to the patient's spouse, the patient went to urgent care in the early morning of 04/02/2019 for right hip/leg pain. The patient was prescribed oxycodone and Robaxin. He subsequently drove her home and the patient took her opioids. Around 2 PM, the patient woke up confused and somewhat lethargic. Her spouse waited for period of time until the patient was more alert. He helped her transfer to a walker, but she sustained a mechanical fall during the transfer resulting in left leg pain. She cannot get up. EMS was activated. In the emergency department, the patient was afebrile hemodynamically stable initially on 6 L. She was confused. X-ray showed a bimalleolar fracture of her left ankle. Orthopedics, Dr. Doran Durand was consulted. He recommended transfer to [facility] as well as reduction and splint placement. Patient became increasingly hypoxic and was placed on BiPAP initially. She was given morphine in preparation for her reduction and splint placement. Subsequently, the patient became somnolent. Repeat ABG showed 7.035/>120/227 on 100%. Chest x-ray showed small bilateral effusions with chronic left basilar consolidation. Because of the patient's respiratory failure and requirement for BiPAP, she was not stable for transfer to [facility]. I contacted orthopedics, Dr. Santiago Glad again. He stated that the patient can be maintained at [facility] now and remain in her splint with nonweightbearing. She can  follow up with him in the office in 1 to 2 weeks after discharge. Since admission, the patient became more alert, but remained confused and intermittently agitated.  Attempts made to wean off BiPAP, but patient desaturates quickly.  Pulmonary medicine was consulted to assist.  Assessment/Plan: Acute on chronic respiratory failure with hypoxia and hypercarbia -multifactorial including COPD, fluid overload, OSA/OHS, pulmonary HTN, diaphragmatic paralysis and hypoventilation from opioids -Normally maintained on 6 L nasal cannula at home -Remains on humidified HFNC 35L -pulmonary consult appreciated -start xopenex/atrovent -continue pulmicort  Bimalleolar fracture left ankle -I discussed case with orthopedics, Dr. Lawson Radar made him aware of the patient's instability for transfer down to [facility] -After discussion with Dr. Johnston Ebbs tokeep patient at [facility] with splint, nonweightbearing   COPD exacerbation -xopenex/atrovent -start pulmicort -continue IV solumedrol  Paroxysmal atrial fibrillation with RVR -continue diltiazem drip -restart apixaban -increase IV lopressor  Acute on Chronic diastolic CHF/Cor Pulmonale -daily weights -04/18/19 Echo--55-60%, no WMA, flatten ventricular septum c/w RV overload -received lasix IV x 2 -labs suggest intravascular depletion, but still on hypervolemic site from third spacing  Acute on chronic renal failure--CKD 3a -baseline creatinine 0.8-1.1 -serum creatinine peak 1.48  Hyperkalemia -Kayexalate-bicarb-D50/IV insulin-calcium gluconate were given -improved  Acute metabolic encephalopathy -Secondary to opioids as well as infectious process and hypercarbia -UA neg for pyuria -d/c morhpine -increase IV haldol--pt having more agitation/confusion rather than pain -ativan prn agitation  Diabetes mellitus type 2 -NovoLog sliding scale -Hemoglobin A1c--6.3  Iron deficiency anemia -04/01/2019 iron saturation 5%, ferritin  14 -nulecit 250 mg x 1  Morbid Obesity -BMI 40.43 -lifestyle modification  OSA/OHS -noncompliant with CPAP at home        Disposition Plan: Patient  From: Home D/C Place: SNF when able to wean off and remain stable off BiPAP Barriers: Not Clinically Stable--remains on high FiO2 with afib RVR  Family Communication:  Son and spouse updated  Consultants:  pulmonary  Code Status:  DNR  DVT Prophylaxis: apixaban   Procedures: As Listed in Progress Note Above  Antibiotics: Zosyn 3/25>>>3/26 azithro 3/25>>>    The patient is critically ill with multiple organ systems failure and requires high complexity decision making for assessment and support, frequent evaluation and titration of therapies, application of advanced monitoring technologies and extensive interpretation of multiple databases.  Critical care time - 40 mins.   Subjective: Pt less agitated this am;  Pleasantly confused.  Denies f/c, cp, sob, n/v/d, abd pain. Remainder ROS unobtainable due to encephalopathy  Objective: Vitals:   04/18/19 0630 04/18/19 0700 04/18/19 0730 04/18/19 0752  BP: (!) 109/55 115/64 (!) 109/52   Pulse: (!) 117 96 (!) 104 (!) 112  Resp: (!) 25 (!) 25 (!) 25 (!) 23  Temp: 100 F (37.8 C) 100 F (37.8 C) 99.9 F (37.7 C) 99.9 F (37.7 C)  TempSrc:      SpO2: 92% 94% 96% 96%  Weight:      Height:        Intake/Output Summary (Last 24 hours) at 04/18/2019 0758 Last data filed at 04/18/2019 0636 Gross per 24 hour  Intake 202.74 ml  Output 1800 ml  Net -1597.26 ml   Weight change: -2.3 kg Exam:   General:  Pt is alert, follows commands appropriately, not in acute distress; confused  HEENT: No icterus, No thrush, No neck mass, [place]/AT  Cardiovascular: RRR, S1/S2, no rubs, no gallops  Respiratory: diminished BS bilateral.  Bibasilar crackles. No wheeze  Abdomen: Soft/+BS, non tender, non distended, no guarding  Extremities: 1+LE edema, No  lymphangitis, No petechiae, No rashes, no synovitis   Data Reviewed: I have personally reviewed following labs and imaging studies Basic Metabolic Panel: Recent Labs  Lab 04/16/19 0313 04/16/19 0751 04/16/19 1527 04/17/19 0434 04/18/19 0545  NA 139 141 144 146* 148*  K 6.2* 6.5* 5.4* 4.9 4.4  CL 101 101 100 101 101  CO2 32 31 35* 33* 35*  GLUCOSE 228* 230* 191* 166* 187*  BUN 29* 32* 36* 40* 52*  CREATININE 1.34* 1.48* 1.51* 1.40* 1.31*  CALCIUM 9.0 9.4 9.5 9.3 9.3  MG  --   --   --  1.9  --    Liver Function Tests: Recent Labs  Lab 04/03/2019 1853 04/17/19 0434 04/18/19 0545  AST 12* 19 23  ALT _0 ALKPHOS 91 92 78  BILITOT 0.4 0.8 0.8  PROT 6.5 6.1* 5.8*  ALBUMIN 3.2* 3.0* 3.0*   No results for input(s): LIPASE, AMYLASE in the last 168 hours. No results for input(s): AMMONIA in the last 168 hours. Coagulation Profile: No results for input(s): INR, PROTIME in the last 168 hours. CBC: Recent Labs  Lab 04/12/2019 1853 04/16/19 0313 04/17/19 0434 04/17/19 0712 04/18/19 0545  WBC 13.6* 19.0* 13.4* 14.9* 13.4*  NEUTROABS 11.3*  --   --  12.6*  --   HGB 9.6* 9.6* 8.1* 8.2* 7.4*  HCT 36.3 37.3 29.8* 29.2* 25.7*  MCV 91.7 93.5 88.2 87.4 85.4  PLT 279 330 215 248 240   Cardiac Enzymes: No results for input(s): CKTOTAL, CKMB, CKMBINDEX, TROPONINI in the last 168 hours. BNP: Invalid input(s): POCBNP CBG: Recent Labs  Lab 04/16/19 2358 04/17/19 0530 04/17/19 1057 04/17/19 2344 04/18/19  0506  GLUCAP 152* 159* 173* 205* 173*   HbA1C: Recent Labs    04/07/2019 2105 04/16/19 0751  HGBA1C 6.3* 6.3*   Urine analysis:    Component Value Date/Time   COLORURINE YELLOW 04/16/2019 1224   APPEARANCEUR CLEAR 04/16/2019 1224   LABSPEC 1.021 04/16/2019 1224   PHURINE 5.0 04/16/2019 1224   GLUCOSEU NEGATIVE 04/16/2019 1224   HGBUR NEGATIVE 04/16/2019 1224   BILIRUBINUR NEGATIVE 04/16/2019 1224   KETONESUR NEGATIVE 04/16/2019 1224   PROTEINUR NEGATIVE  04/16/2019 1224   NITRITE NEGATIVE 04/16/2019 1224   LEUKOCYTESUR NEGATIVE 04/16/2019 1224   Sepsis Labs: _0 (procalcitonin:4,lacticidven:4) ) Recent Results (from the past 240 hour(s))  SARS CORONAVIRUS 2 (Pearla Mckinny 6-24 HRS) Nasopharyngeal Nasopharyngeal Swab     Status: None   Collection Time: 04/17/2019  7:54 PM   Specimen: Nasopharyngeal Swab  Result Value Ref Range Status   SARS Coronavirus 2 NEGATIVE NEGATIVE Final    Comment: (NOTE) SARS-CoV-2 target nucleic acids are NOT DETECTED. The SARS-CoV-2 RNA is generally detectable in upper and lower respiratory specimens during the acute phase of infection. Negative results do not preclude SARS-CoV-2 infection, do not rule out co-infections with other pathogens, and should not be used as the sole basis for treatment or other patient management decisions. Negative results must be combined with clinical observations, patient history, and epidemiological information. The expected result is Negative. Fact Sheet for Patients: SugarRoll.be Fact Sheet for Healthcare Providers: https://www.woods-mathews.com/ This test is not yet approved or cleared by the [place] FDA and  has been authorized for detection and/or diagnosis of SARS-CoV-2 by FDA under an Emergency Use Authorization (EUA). This EUA will remain  in effect (meaning this test can be used) for the duration of the COVID-19 declaration under Section 56 4(b)(1) of the Act, 21 U.S.C. section 360bbb-3(b)(1), unless the authorization is terminated or revoked sooner. Performed at [facility], [place] [address], [place], [place] [address]   Respiratory Panel by RT PCR (Flu A&B, Covid) - Nasopharyngeal Swab     Status: None   Collection Time: 04/01/2019 10:36 PM   Specimen: Nasopharyngeal Swab  Result Value Ref Range Status   SARS Coronavirus 2 by RT PCR NEGATIVE NEGATIVE Final    Comment: (NOTE) SARS-CoV-2 target nucleic acids are  NOT DETECTED. The SARS-CoV-2 RNA is generally detectable in upper respiratoy specimens during the acute phase of infection. The lowest concentration of SARS-CoV-2 viral copies this assay can detect is 131 copies/mL. A negative result does not preclude SARS-Cov-2 infection and should not be used as the sole basis for treatment or other patient management decisions. A negative result may occur with  improper specimen collection/handling, submission of specimen other than nasopharyngeal swab, presence of viral mutation(s) within the areas targeted by this assay, and inadequate number of viral copies (<131 copies/mL). A negative result must be combined with clinical observations, patient history, and epidemiological information. The expected result is Negative. Fact Sheet for Patients:  PinkCheek.be Fact Sheet for Healthcare Providers:  GravelBags.it This test is not yet ap proved or cleared by the [place] FDA and  has been authorized for detection and/or diagnosis of SARS-CoV-2 by FDA under an Emergency Use Authorization (EUA). This EUA will remain  in effect (meaning this test can be used) for the duration of the COVID-19 declaration under Section 564(b)(1) of the Act, 21 U.S.C. section 360bbb-3(b)(1), unless the authorization is terminated or revoked sooner.    Influenza A by PCR NEGATIVE NEGATIVE Final   Influenza B by  PCR NEGATIVE NEGATIVE Final    Comment: (NOTE) The Xpert Xpress SARS-CoV-2/FLU/RSV assay is intended as an aid in  the diagnosis of influenza from Nasopharyngeal swab specimens and  should not be used as a sole basis for treatment. Nasal washings and  aspirates are unacceptable for Xpert Xpress SARS-CoV-2/FLU/RSV  testing. Fact Sheet for Patients: PinkCheek.be Fact Sheet for Healthcare Providers: GravelBags.it This test is not yet approved or  cleared by the [place] FDA and  has been authorized for detection and/or diagnosis of SARS-CoV-2 by  FDA under an Emergency Use Authorization (EUA). This EUA will remain  in effect (meaning this test can be used) for the duration of the  Covid-19 declaration under Section 564(b)(1) of the Act, 21  U.S.C. section 360bbb-3(b)(1), unless the authorization is  terminated or revoked. Performed at [facility], [address] [place], [place] [address]   Culture, blood (Routine X 2) w Reflex to ID Panel     Status: None (Preliminary result)   Collection Time: 04/16/19  9:49 AM   Specimen: BLOOD LEFT HAND  Result Value Ref Range Status   Specimen Description BLOOD LEFT HAND  Final   Special Requests   Final    BOTTLES DRAWN AEROBIC AND ANAEROBIC Blood Culture results may not be optimal due to an inadequate volume of blood received in culture bottles   Culture   Final    NO GROWTH < 24 HOURS Performed at [facility], [address], [place], [place] [address]    Report Status PENDING  Incomplete  MRSA PCR Screening     Status: None   Collection Time: 04/16/19 10:34 AM   Specimen: Nasopharyngeal  Result Value Ref Range Status   MRSA by PCR NEGATIVE NEGATIVE Final    Comment:        The GeneXpert MRSA Assay (FDA approved for NASAL specimens only), is one component of a comprehensive MRSA colonization surveillance program. It is not intended to diagnose MRSA infection nor to guide or monitor treatment for MRSA infections. Performed at [facility], [address], [place], [place] [address]   Culture, Urine     Status: None   Collection Time: 04/16/19 12:24 PM   Specimen: Urine, Random  Result Value Ref Range Status   Specimen Description   Final    URINE, RANDOM Performed at [facility], [address], [place], [place] [address]    Special Requests   Final    NONE Performed at [facility], [address] [place], [place] [address]    Culture   Final    NO  GROWTH Performed at [facility], [place] [address], [place], [place] [address]    Report Status 04/17/2019 FINAL  Final     Scheduled Meds: . budesonide (PULMICORT) nebulizer solution  0.5 mg Nebulization BID  . chlorhexidine  15 mL Mouth Rinse BID  . Chlorhexidine Gluconate Cloth  6 each Topical Daily  . enoxaparin (LOVENOX) injection  40 mg Subcutaneous Q24H  . insulin aspart  0-9 Units Subcutaneous Q6H  . ipratropium  0.5 mg Nebulization Q6H  . latanoprost  1 drop Both Eyes QHS  . levalbuterol  0.63 mg Nebulization Q6H  . mouth rinse  15 mL Mouth Rinse q12n4p  . methylPREDNISolone (SOLU-MEDROL) injection  40 mg Intravenous Q12H  . metoprolol tartrate  2.5 mg Intravenous Once  . metoprolol tartrate  5 mg Intravenous Q6H  . nystatin   Topical BID  . pantoprazole (PROTONIX) IV  40 mg  Intravenous Q24H  . sodium chloride flush  10-40 mL Intracatheter Q12H  . sodium chloride flush  3 mL Intravenous Q12H   Continuous Infusions: . sodium chloride    . azithromycin 500 mg (04/17/19 0737)  . diltiazem (CARDIZEM) infusion 20 mg/hr (04/18/19 0636)  . ferric gluconate (FERRLECIT/NULECIT) IV      Procedures/Studies: DG Ankle 2 Views Left  Result Date:  CLINICAL DATA:  Bimalleolar ankle fracture, reduction EXAM: LEFT ANKLE - 2 VIEW COMPARISON:  04/09/2019 FINDINGS: Frontal and lateral views of the left ankle demonstrate reduction of the bimalleolar ankle fracture seen previously, with near anatomic alignment of the medial and lateral malleoli. The talus is now aligned anatomic [place] with the tibial plafond. Diffuse soft tissue edema. IMPRESSION: 1. Near anatomic alignment of the bimalleolar fracture seen previously. 2. Anatomic alignment of the tibiotalar joint. Electronically Signed   By: Randa Ngo M.D.   On: 04/20/2019 23:11   DG Ankle Complete Left  Result Date: 03/30/2019 CLINICAL DATA:  [place], pain and bruising, visible deformity EXAM: LEFT ANKLE COMPLETE - 3+ VIEW  COMPARISON:  None. FINDINGS: Frontal and cross-table lateral views of the left ankle are obtained. Mildly comminuted oblique fracture of the distal fibular diaphysis is noted. There is a comminuted transverse fracture of the medial malleolus. There is lateral displacement of the medial and lateral malleolar fracture fragments, with severe lateral subluxation of the talus relative to the tibial plafond. There is diffuse soft tissue edema. IMPRESSION: 1. Bimalleolar fracture with severe lateral subluxation of the talus. Electronically Signed   By: Randa Ngo M.D.   On: 04/05/2019 18:54   CT Head Wo Contrast  Result Date: 04/09/2019 CLINICAL DATA:  86 year old female status post syncope and fall. EXAM: CT HEAD WITHOUT CONTRAST TECHNIQUE: Contiguous axial images were obtained from the base of the skull through the vertex without intravenous contrast. COMPARISON:  None. FINDINGS: Brain: Cerebral volume is within normal limits for age. No midline shift, ventriculomegaly, mass effect, evidence of mass lesion, intracranial hemorrhage or evidence of cortically based acute infarction. Possible small focus of subependymal gray matter heterotopia at the left lateral ventricle on series 2, image 15. Elsewhere gray-white matter differentiation appears normal for age. Vascular: Calcified atherosclerosis at the skull base. No suspicious intracranial vascular hyperdensity. Skull: Negative, no skull fracture identified. Sinuses/Orbits: Bilateral mastoid effusions and middle ear opacification, appears to be inflammatory. Negative visible nasopharynx. Visible paranasal sinuses are clear. Other: No acute orbit or scalp soft tissue injury identified. IMPRESSION: 1. No acute intracranial abnormality or acute traumatic injury identified. 2. Bilateral middle ear and mastoid opacification appears to be inflammatory. Consider otitis media or cholesteatoma. ENT follow-up may be valuable. Electronically Signed   By: Genevie Ann M.D.   On:  03/31/2019 21:42   CT Cervical Spine Wo Contrast  Result Date: 04/17/2019 CLINICAL DATA:  86 year old female status post syncope and fall. EXAM: CT CERVICAL SPINE WITHOUT CONTRAST TECHNIQUE: Multidetector CT imaging of the cervical spine was performed without intravenous contrast. Multiplanar CT image reconstructions were also generated. COMPARISON:  Head CT today reported separately. Chest CT 08/08/2017. FINDINGS: Alignment: Mild reversal of cervical lordosis. Subtle anterolisthesis at C3-C4 and C4-C5 appears to be degenerative in nature with associated facet arthropathy. Cervicothoracic junction alignment is within normal limits. Bilateral posterior element alignment is within normal limits. Skull base and vertebrae: Visualized skull base is intact. No atlanto-occipital dissociation. No acute osseous abnormality identified. Mild spina bifida occulta at C6 (normal variant). Soft tissues and spinal canal: No prevertebral  fluid or swelling. No visible canal hematoma. Negative noncontrast neck soft tissues, partially retropharyngeal carotids. Disc levels: Widespread bilateral cervical facet arthropathy. Comparatively mild chronic disc and endplate degeneration. No significant spinal stenosis suspected. Upper chest: Visible upper thoracic levels appear intact. There is confluent new lung opacity in the left apex on series 4, image 72 when compared to the 2019 chest CT. This does resemble atelectasis. Negative visible superior mediastinum. IMPRESSION: 1. No acute traumatic injury identified in the cervical spine. 2. New nonspecific left apical lung opacity (series 4, image 72) when compared to a 2019 Chest CT. Query cough. Consider a repeat Chest CT (noncontrast may suffice) in 2-3 months to re-evaluate. Electronically Signed   By: Genevie Ann M.D.   On: 03/29/2019 21:48   DG CHEST PORT 1 VIEW  Result Date: 04/17/2019 CLINICAL DATA:  Shortness of breath. EXAM: PORTABLE CHEST 1 VIEW COMPARISON:  03/24/2019. FINDINGS:  Mediastinum and hilar structures are stable. Cardiomegaly. Pulmonary vascular congestion. Prominent left base atelectasis and consolidation. This could be from bronchial obstruction as the left mainstem bronchus appears to be truncated. Prominent left pleural effusion. No pneumothorax. No acute bony abnormality. Surgical clips upper abdomen. IMPRESSION: 1. Left mainstem bronchus truncation with prominent left base atelectasis and consolidation. Moderate left pleural effusion. 2.  Cardiomegaly with pulmonary venous congestion. Electronically Signed   By: Marcello Moores  Register   On: 04/17/2019 08:38   DG Chest Portable 1 View  Result Date: 04/12/2019 CLINICAL DATA:  Shortness of breath, left ankle fracture EXAM: PORTABLE CHEST 1 VIEW COMPARISON:  10/28/2018 FINDINGS: Single frontal view of the chest demonstrates persistent enlargement of the cardiac silhouette. There is chronic central vascular congestion, with chronic left basilar consolidation unchanged. Small bilateral pleural effusions versus pleural thickening, left greater than right, again noted. No pneumothorax. No acute bony abnormality. IMPRESSION: 1. Chronic vascular congestion. 2. Chronic left basilar consolidation compatible with atelectasis or scarring. 3. Stable small bilateral pleural effusions versus pleural thickening. Electronically Signed   By: Randa Ngo M.D.   On: 04/04/2019 23:12   ECHOCARDIOGRAM COMPLETE  Result Date: 04/17/2019    ECHOCARDIOGRAM REPORT   Patient Name:   RHIANN BOUCHER Date of Exam: 04/17/2019 Medical Rec #:  [id number]       Height:       61.0 in Accession #:    [id number]      Weight:       221.8 lb Date of Birth:  04-17-1933       BSA:          1.974 m Patient Age:    65 years        BP:           133/66 mmHg Patient Gender: F               HR:           114 bpm. Exam Location:  [facility] Procedure: 2D Echo Indications:    CHF-Acute Diastolic 436.06 / V70.34  History:        Patient has prior history of Echocardiogram  examinations, most                 recent 03/08/2017. CHF, COPD, Arrythmias:Atrial Fibrillation,                 Signs/Symptoms:Chest Pain; Risk Factors:Diabetes. Pulmonary                 hypertension , Chronic respiratory failure.  Sonographer:  Leavy Cella RDCS (AE) Referring Phys: [phone] Nezar Buckles IMPRESSIONS  1. Left ventricular ejection fraction, by estimation, is 55 to 60%. The left ventricle has normal function. The left ventricle has no regional wall motion abnormalities. Left ventricular diastolic parameters are indeterminate.  2. There is flattening of the ventricular septum in systole consistent with RV pressure overload. . Right ventricular systolic function is mildly reduced. The right ventricular size is moderately enlarged. There is mildly elevated pulmonary artery systolic pressure.  3. Left atrial size was mild to moderately dilated.  4. Right atrial size was mildly dilated.  5. The mitral valve is normal in structure. No evidence of mitral valve regurgitation. No evidence of mitral stenosis.  6. The aortic valve is tricuspid. Aortic valve regurgitation is not visualized. No aortic stenosis is present.  7. Indeterminant PASP, inadequate TR jet. FINDINGS  Left Ventricle: Left ventricular ejection fraction, by estimation, is 55 to 60%. The left ventricle has normal function. The left ventricle has no regional wall motion abnormalities. The left ventricular internal cavity size was normal in size. There is  no left ventricular hypertrophy. Left ventricular diastolic parameters are indeterminate. Right Ventricle: There is flattening of the ventricular septum in systole consistent with RV pressure overload. The right ventricular size is moderately enlarged. Right vetricular wall thickness was not assessed. Right ventricular systolic function is mildly reduced. There is mildly elevated pulmonary artery systolic pressure. The tricuspid regurgitant velocity is 2.35 m/s, and with an assumed right atrial  pressure of 10 mmHg, the estimated right ventricular systolic pressure is 11.9 mmHg. Left Atrium: Left atrial size was mild to moderately dilated. Right Atrium: Right atrial size was mildly dilated. Pericardium: There is no evidence of pericardial effusion. Mitral Valve: The mitral valve is normal in structure. No evidence of mitral valve regurgitation. No evidence of mitral valve stenosis. Tricuspid Valve: The tricuspid valve is not well visualized. Tricuspid valve regurgitation is trivial. No evidence of tricuspid stenosis. Aortic Valve: The aortic valve is tricuspid. Aortic valve regurgitation is not visualized. No aortic stenosis is present. Aortic valve mean gradient measures 4.7 mmHg. Aortic valve peak gradient measures 10.5 mmHg. Aortic valve area, by VTI measures 2.37  cm. Pulmonic Valve: The pulmonic valve was not well visualized. Pulmonic valve regurgitation is not visualized. No evidence of pulmonic stenosis. Aorta: The aortic root is normal in size and structure. Pulmonary Artery: Indeterminant PASP, inadequate TR jet. Venous: The inferior vena cava was not well visualized. IAS/Shunts: The interatrial septum was not well visualized.  LEFT VENTRICLE PLAX 2D LVIDd:         4.78 cm  Diastology LVIDs:         3.63 cm  LV e' lateral:   10.00 cm/s LV PW:         0.94 cm  LV E/e' lateral: 8.3 LV IVS:        0.75 cm  LV e' medial:    5.66 cm/s LVOT diam:     1.90 cm  LV E/e' medial:  14.6 LV SV:         62 LV SV Index:   32 LVOT Area:     2.84 cm  RIGHT VENTRICLE RV S prime:     10.40 cm/s TAPSE (M-mode): 1.3 cm LEFT ATRIUM             Index LA diam:        3.70 cm 1.87 cm/m LA Vol (A2C):   80.9 ml 40.99 ml/m LA Vol (A4C):  56.4 ml 28.58 ml/m LA Biplane Vol: 67.9 ml 34.40 ml/m  AORTIC VALVE AV Area (Vmax):    1.67 cm AV Area (Vmean):   1.79 cm AV Area (VTI):     2.37 cm AV Vmax:           161.68 cm/s AV Vmean:          99.684 cm/s AV VTI:            0.263 m AV Peak Grad:      10.5 mmHg AV Mean Grad:       4.7 mmHg LVOT Vmax:         94.98 cm/s LVOT Vmean:        63.023 cm/s LVOT VTI:          0.220 m LVOT/AV VTI ratio: 0.84  AORTA Ao Root diam: 2.50 cm MITRAL VALVE               TRICUSPID VALVE MV Area (PHT): 5.02 cm    TR Peak grad:   22.1 mmHg MV Decel Time: 151 msec    TR Vmax:        235.00 cm/s MV E velocity: 82.70 cm/s MV A velocity: 81.00 cm/s  SHUNTS MV E/A ratio:  1.02        Systemic VTI:  0.22 m                            Systemic Diam: 1.90 cm Carlyle Dolly MD Electronically signed by Carlyle Dolly MD Signature Date/Time: 04/17/2019/11:23:36 AM    Final    [place] EKG SITE RITE  Result Date: 04/17/2019 If Site Rite image not attached, placement could not be confirmed due to current cardiac rhythm.   Orson Eva, DO  [facility]  If 7PM-7AM, please contact night-coverage [email or web address] Password [facility] 04/18/2019, 7:58 AM   LOS: 2 days

## 2019-04-19 DIAGNOSIS — Z7189 Other specified counseling: Secondary | ICD-10-CM

## 2019-04-19 LAB — CBC WITH DIFFERENTIAL/PLATELET
Abs Immature Granulocytes: 0.22 10*3/uL — ABNORMAL HIGH (ref 0.00–0.07)
Basophils Absolute: 0 10*3/uL (ref 0.0–0.1)
Basophils Relative: 0 %
Eosinophils Absolute: 0 10*3/uL (ref 0.0–0.5)
Eosinophils Relative: 0 %
HCT: 28 % — ABNORMAL LOW (ref 36.0–46.0)
Hemoglobin: 7.9 g/dL — ABNORMAL LOW (ref 12.0–15.0)
Immature Granulocytes: 2 %
Lymphocytes Relative: 7 %
Lymphs Abs: 0.8 10*3/uL (ref 0.7–4.0)
MCH: 24.8 pg — ABNORMAL LOW (ref 26.0–34.0)
MCHC: 28.2 g/dL — ABNORMAL LOW (ref 30.0–36.0)
MCV: 87.8 fL (ref 80.0–100.0)
Monocytes Absolute: 0.8 10*3/uL (ref 0.1–1.0)
Monocytes Relative: 7 %
Neutro Abs: 9.7 10*3/uL — ABNORMAL HIGH (ref 1.7–7.7)
Neutrophils Relative %: 84 %
Platelets: 214 10*3/uL (ref 150–400)
RBC: 3.19 MIL/uL — ABNORMAL LOW (ref 3.87–5.11)
RDW: 22.6 % — ABNORMAL HIGH (ref 11.5–15.5)
WBC: 11.6 10*3/uL — ABNORMAL HIGH (ref 4.0–10.5)
nRBC: 1 % — ABNORMAL HIGH (ref 0.0–0.2)

## 2019-04-19 LAB — MAGNESIUM: Magnesium: 2.2 mg/dL (ref 1.7–2.4)

## 2019-04-19 LAB — COMPREHENSIVE METABOLIC PANEL
ALT: 20 U/L (ref 0–44)
AST: 19 U/L (ref 15–41)
Albumin: 3 g/dL — ABNORMAL LOW (ref 3.5–5.0)
Alkaline Phosphatase: 74 U/L (ref 38–126)
Anion gap: 14 (ref 5–15)
BUN: 42 mg/dL — ABNORMAL HIGH (ref 8–23)
CO2: 33 mmol/L — ABNORMAL HIGH (ref 22–32)
Calcium: 9.3 mg/dL (ref 8.9–10.3)
Chloride: 101 mmol/L (ref 98–111)
Creatinine, Ser: 1.02 mg/dL — ABNORMAL HIGH (ref 0.44–1.00)
GFR calc Af Amer: 59 mL/min — ABNORMAL LOW (ref >60)
GFR calc non Af Amer: 51 mL/min — ABNORMAL LOW (ref >60)
Glucose, Bld: 166 mg/dL — ABNORMAL HIGH (ref 70–99)
Potassium: 4 mmol/L (ref 3.5–5.1)
Sodium: 148 mmol/L — ABNORMAL HIGH (ref 135–145)
Total Bilirubin: 0.9 mg/dL (ref 0.3–1.2)
Total Protein: 6 g/dL — ABNORMAL LOW (ref 6.5–8.1)

## 2019-04-19 LAB — TSH: TSH: 0.573 u[IU]/mL (ref 0.350–4.500)

## 2019-04-19 LAB — PROCALCITONIN: Procalcitonin: <0.1 ng/mL

## 2019-04-19 LAB — GLUCOSE, CAPILLARY
Glucose-Capillary: 169 mg/dL — ABNORMAL HIGH (ref 70–99)
Glucose-Capillary: 175 mg/dL — ABNORMAL HIGH (ref 70–99)
Glucose-Capillary: 158 mg/dL — ABNORMAL HIGH (ref 70–99)

## 2019-04-19 LAB — T4, FREE: Free T4: 1.11 ng/dL (ref 0.61–1.12)

## 2019-04-19 MED ORDER — ARFORMOTEROL TARTRATE 15 MCG/2ML IN NEBU
15.0000 ug | INHALATION_SOLUTION | Freq: Two times a day (BID) | RESPIRATORY_TRACT | Status: DC
  Administered 2019-04-19 – 2019-04-20 (×2): 15 ug via RESPIRATORY_TRACT
  Filled 2019-04-19 (×2): qty 2

## 2019-04-19 MED ORDER — QUETIAPINE FUMARATE 25 MG PO TABS
25.0000 mg | ORAL_TABLET | Freq: Every day | ORAL | Status: DC
  Filled 2019-04-19: qty 1

## 2019-04-19 NOTE — Progress Notes (Signed)
Pt husband called for updates. Advised that patient had been awake the majority of the night, restless, agitated and yelling for him or for "help." Advised that she received the PRN ativan Q4 hours because she had been agitated. He could hear her yelling in the background. Advised he was finishing his coffee and he would be on his way.

## 2019-04-19 NOTE — Progress Notes (Signed)
PROGRESS NOTE  Becky Newton:[id number] DOB: [birth year] DOA: 04/03/2019 PCP: [name], NP  Brief History: 86 year old female with a history of COPD, chronic respiratory failure on 6 L, paroxysmal atrial fibrillation, diastolic CHF, OSA, diabetes mellitus type 2 presenting with a mechanical fall and left ankle fracture. The patient is unable to provide history secondary to her encephalopathy. All the history is obtained from review of the medical record. According to the patient's spouse, the patient went to urgent care in the early morning of 04/13/2019 for right hip/leg pain. The patient was prescribed oxycodone and Robaxin. He subsequently drove her home and the patient took her opioids. Around 2 PM, the patient woke up confused and somewhat lethargic. Her spouse waited for period of time until the patient was more alert. He helped her transfer to a walker, but she sustained a mechanical fall during the transfer resulting in left leg pain. She cannot get up. EMS was activated. In the emergency department, the patient was afebrile hemodynamically stable initially on 6 L. She was confused. X-ray showed a bimalleolar fracture of her left ankle. Orthopedics, Dr. Doran Durand was consulted. He recommended transfer to [facility] as well as reduction and splint placement. Patient became increasingly hypoxic and was placed on BiPAP initially. She was given morphine in preparation for her reduction and splint placement. Subsequently, the patient became somnolent. Repeat ABG showed 7.035/>120/227 on 100%. Chest x-ray showed small bilateral effusions with chronic left basilar consolidation. Because of the patient's respiratory failure and requirement for BiPAP, she was not stable for transfer to [facility]. I contacted orthopedics, Dr. Santiago Glad again. He stated that the patient can be maintained at [facility] now and remain in her splint with nonweightbearing. She can  follow up with him in the office in 1 to 2 weeks after discharge. Since admission, the patient became more alert, but remained confused and intermittently agitated. Attempts made to wean off BiPAP, but patient desaturates quickly. Pulmonary medicine was consulted to assist.  BiPAP was ultimately discontinued altogether per family request, and patient was maintained on humidified HFNC/NRB  Assessment/Plan: Acute on chronic respiratory failure with hypoxia and hypercarbia -multifactorial including COPD, fluid overload, OSA/OHS, pulmonary HTN, diaphragmatic paralysis and hypoventilation from opioids -Normally maintained on 6 L nasal cannula at home -Remainson humidified HFNC vs NRB -pulmonary consult appreciated -continue xopenex/atrovent -continue pulmicort  Bimalleolar fracture left ankle -I discussed case with orthopedics, Dr. Lawson Radar made him aware of the patient's instability for transfer down to [facility] -After discussion with Dr. Johnston Ebbs tokeep [facility]with [facility], nonweightbearing   COPD exacerbation -xopenex/atrovent -continue pulmicort -continue IV solumedrol  Paroxysmal atrial fibrillation with RVR -spontaneously converted to sinus -continue diltiazem drip>>d/c -restart apixaban -continue IV lopressor  Acute onChronic diastolic CHF/Cor Pulmonale -daily weights -04/18/19 Echo--55-60%, no WMA, flatten ventricular septum c/w RV overload -received lasix IV x 2 -labs suggest intravascular depletion, but still on hypervolemic site from third spacing  Acute on chronic renal failure--CKD 3a -baseline creatinine 0.8-1.1 -serum creatinine peak 1.48  Hyperkalemia -Kayexalate-bicarb-D50/IV insulin-calcium gluconatewere given -improved  Acute metabolic encephalopathy -Secondary to opioids as well as infectious process and hypercarbia -UA neg for pyuria -d/c morhpine -increase IV haldol--pt having more agitation/confusion rather than pain -ativan  prn agitation -add seroquel at bedtime  Diabetes mellitus type 2 -NovoLog sliding scale -Hemoglobin A1c--6.3  Iron deficiency anemia -04/01/2019 iron saturation 5%, ferritin 14 -nulecit 250 mg x 1  Morbid Obesity -BMI 40.43 -lifestyle modification  OSA/OHS -noncompliant with CPAPat home        Disposition Plan: Patient From: Home D/C Place: SNFwhen able to wean off and remain stable off BiPAP Barriers: Not Clinically Stable--remains on high FiO2 with afib RVR  Family Communication:Son and spouse updated 3/28  Consultants:pulmonary  Code Status:DNR  DVT Prophylaxis:apixaban   Procedures: As Listed in Progress Note Above  Antibiotics: Zosyn 3/25>>>3/26;  3/27>>> azithro 3/25>>>     Subjective: Patient is having episodes of agitation causing tachycardia and desaturation.  No vomiting, diarrhea, uncontrolled pain.  Objective: Vitals:   04/19/19 0700 04/19/19 0707 04/19/19 0730 04/19/19 0800  BP: (!) 148/62   (!) 141/78  Pulse: 91 74 80 (!) 110  Resp: (!) 35 19 (!) 22 (!) 36  Temp: 98.4 F (36.9 C) 98.4 F (36.9 C) 98.6 F (37 C) 98.6 F (37 C)  TempSrc:   Bladder Bladder  SpO2: 91% 98% 99%   Weight:      Height:        Intake/Output Summary (Last 24 hours) at 04/19/2019 0814 Last data filed at 04/19/2019 0532 Gross per 24 hour  Intake 619.19 ml  Output 1125 ml  Net -505.81 ml   Weight change: -3.1 kg Exam:   General:  Pt is alert, does not follow commands appropriately, agitated easily  HEENT: No icterus, No thrush, No neck mass, Welsh/AT  Cardiovascular: RRR, S1/S2, no rubs, no gallops  Respiratory: diminished BS bilateral.  Bilateral rales. No wheeze  Abdomen: Soft/+BS, non tender, non distended, no guarding  Extremities: No edema, No lymphangitis, No petechiae, No rashes, no synovitis   Data Reviewed: I have personally reviewed following labs and imaging studies Basic Metabolic Panel: Recent Labs  Lab  04/16/19 0313 04/16/19 0751 04/16/19 1527 04/17/19 0434 04/18/19 0545  NA 139 141 144 146* 148*  K 6.2* 6.5* 5.4* 4.9 4.4  CL 101 101 100 101 101  CO2 32 31 35* 33* 35*  GLUCOSE 228* 230* 191* 166* 187*  BUN 29* 32* 36* 40* 52*  CREATININE 1.34* 1.48* 1.51* 1.40* 1.31*  CALCIUM 9.0 9.4 9.5 9.3 9.3  MG  --   --   --  1.9  --    Liver Function Tests: Recent Labs  Lab 03/24/2019 1853 04/17/19 0434 04/18/19 0545  AST 12* 19 23  ALT _0 ALKPHOS 91 92 78  BILITOT 0.4 0.8 0.8  PROT 6.5 6.1* 5.8*  ALBUMIN 3.2* 3.0* 3.0*   No results for input(s): LIPASE, AMYLASE in the last 168 hours. No results for input(s): AMMONIA in the last 168 hours. Coagulation Profile: No results for input(s): INR, PROTIME in the last 168 hours. CBC: Recent Labs  Lab 03/31/2019 1853 04/14/2019 1853 04/16/19 0313 04/17/19 0434 04/17/19 0712 04/18/19 0545 04/19/19 0452  WBC 13.6*   < > 19.0* 13.4* 14.9* 13.4* 11.6*  NEUTROABS 11.3*  --   --   --  12.6*  --  9.7*  HGB 9.6*   < > 9.6* 8.1* 8.2* 7.4* 7.9*  HCT 36.3   < > 37.3 29.8* 29.2* 25.7* 28.0*  MCV 91.7   < > 93.5 88.2 87.4 85.4 87.8  PLT 279   < > 330 215 248 240 214   < > = values in this interval not displayed.   Cardiac Enzymes: No results for input(s): CKTOTAL, CKMB, CKMBINDEX, TROPONINI in the last 168 hours. BNP: Invalid input(s): POCBNP CBG: Recent Labs  Lab 04/18/19 0506 04/18/19 1137 04/18/19 1739 04/18/19 2331 04/19/19 0520  GLUCAP 173* 179* 155* 174* 169*   HbA1C: No results for input(s): HGBA1C in the last 72 hours. Urine analysis:    Component Value Date/Time   COLORURINE YELLOW 04/16/2019 1224   APPEARANCEUR CLEAR 04/16/2019 1224   LABSPEC 1.021 04/16/2019 1224   PHURINE 5.0 04/16/2019 1224   GLUCOSEU NEGATIVE 04/16/2019 1224   HGBUR NEGATIVE 04/16/2019 1224   BILIRUBINUR NEGATIVE 04/16/2019 1224   KETONESUR NEGATIVE 04/16/2019 1224   PROTEINUR NEGATIVE 04/16/2019 1224   NITRITE NEGATIVE 04/16/2019 1224    LEUKOCYTESUR NEGATIVE 04/16/2019 1224   Sepsis Labs: _0 (procalcitonin:4,lacticidven:4) ) Recent Results (from the past 240 hour(s))  SARS CORONAVIRUS 2 (Paytin Ramakrishnan 6-24 HRS) Nasopharyngeal Nasopharyngeal Swab     Status: None   Collection Time: 04/13/2019  7:54 PM   Specimen: Nasopharyngeal Swab  Result Value Ref Range Status   SARS Coronavirus 2 NEGATIVE NEGATIVE Final    Comment: (NOTE) SARS-CoV-2 target nucleic acids are NOT DETECTED. The SARS-CoV-2 RNA is generally detectable in upper and lower respiratory specimens during the acute phase of infection. Negative results do not preclude SARS-CoV-2 infection, do not rule out co-infections with other pathogens, and should not be used as the sole basis for treatment or other patient management decisions. Negative results must be combined with clinical observations, patient history, and epidemiological information. The expected result is Negative. Fact Sheet for Patients: SugarRoll.be Fact Sheet for Healthcare Providers: https://www.woods-mathews.com/ This test is not yet approved or cleared by the [place] FDA and  has been authorized for detection and/or diagnosis of SARS-CoV-2 by FDA under an Emergency Use Authorization (EUA). This EUA will remain  in effect (meaning this test can be used) for the duration of the COVID-19 declaration under Section 56 4(b)(1) of the Act, 21 U.S.C. section 360bbb-3(b)(1), unless the authorization is terminated or revoked sooner. Performed at [facility], [place] [address], [place], [place] [address]   Respiratory Panel by RT PCR (Flu A&B, Covid) - Nasopharyngeal Swab     Status: None   Collection Time: 04/19/2019 10:36 PM   Specimen: Nasopharyngeal Swab  Result Value Ref Range Status   SARS Coronavirus 2 by RT PCR NEGATIVE NEGATIVE Final    Comment: (NOTE) SARS-CoV-2 target nucleic acids are NOT DETECTED. The SARS-CoV-2 RNA is generally detectable  in upper respiratoy specimens during the acute phase of infection. The lowest concentration of SARS-CoV-2 viral copies this assay can detect is 131 copies/mL. A negative result does not preclude SARS-Cov-2 infection and should not be used as the sole basis for treatment or other patient management decisions. A negative result may occur with  improper specimen collection/handling, submission of specimen other than nasopharyngeal swab, presence of viral mutation(s) within the areas targeted by this assay, and inadequate number of viral copies (<131 copies/mL). A negative result must be combined with clinical observations, patient history, and epidemiological information. The expected result is Negative. Fact Sheet for Patients:  PinkCheek.be Fact Sheet for Healthcare Providers:  GravelBags.it This test is not yet ap proved or cleared by the [place] FDA and  has been authorized for detection and/or diagnosis of SARS-CoV-2 by FDA under an Emergency Use Authorization (EUA). This EUA will remain  in effect (meaning this test can be used) for the duration of the COVID-19 declaration under Section 564(b)(1) of the Act, 21 U.S.C. section 360bbb-3(b)(1), unless the authorization is terminated or revoked sooner.    Influenza A by PCR NEGATIVE NEGATIVE Final   Influenza B by PCR NEGATIVE NEGATIVE Final  Comment: (NOTE) The Xpert Xpress SARS-CoV-2/FLU/RSV assay is intended as an aid in  the diagnosis of influenza from Nasopharyngeal swab specimens and  should not be used as a sole basis for treatment. Nasal washings and  aspirates are unacceptable for Xpert Xpress SARS-CoV-2/FLU/RSV  testing. Fact Sheet for Patients: PinkCheek.be Fact Sheet for Healthcare Providers: GravelBags.it This test is not yet approved or cleared by the [place] FDA and  has been authorized for  detection and/or diagnosis of SARS-CoV-2 by  FDA under an Emergency Use Authorization (EUA). This EUA will remain  in effect (meaning this test can be used) for the duration of the  Covid-19 declaration under Section 564(b)(1) of the Act, 21  U.S.C. section 360bbb-3(b)(1), unless the authorization is  terminated or revoked. Performed at [facility], [address] [place], [place] [address]   Culture, blood (Routine X 2) w Reflex to ID Panel     Status: None (Preliminary result)   Collection Time: 04/16/19  9:49 AM   Specimen: BLOOD LEFT HAND  Result Value Ref Range Status   Specimen Description BLOOD LEFT HAND  Final   Special Requests   Final    BOTTLES DRAWN AEROBIC AND ANAEROBIC Blood Culture results may not be optimal due to an inadequate volume of blood received in culture bottles   Culture   Final    NO GROWTH < 24 HOURS Performed at [facility], [address] [place], [place] [address]    Report Status PENDING  Incomplete  MRSA PCR Screening     Status: None   Collection Time: 04/16/19 10:34 AM   Specimen: Nasopharyngeal  Result Value Ref Range Status   MRSA by PCR NEGATIVE NEGATIVE Final    Comment:        The GeneXpert MRSA Assay (FDA approved for NASAL specimens only), is one component of a comprehensive MRSA colonization surveillance program. It is not intended to diagnose MRSA infection nor to guide or monitor treatment for MRSA infections. Performed at [facility], [address] [place], [place] [address]   Culture, Urine     Status: None   Collection Time: 04/16/19 12:24 PM   Specimen: Urine, Random  Result Value Ref Range Status   Specimen Description   Final    URINE, RANDOM Performed at [facility], [address], [place], [place] [address]    Special Requests   Final    NONE Performed at [facility], [address] [place], [place] [address]    Culture   Final    NO GROWTH Performed at [facility], [place] [address], [place],  [place] [address]    Report Status 04/17/2019 FINAL  Final     Scheduled Meds: . apixaban  5 mg Oral BID  . arformoterol  15 mcg Nebulization BID  . budesonide (PULMICORT) nebulizer solution  0.5 mg Nebulization BID  . chlorhexidine  15 mL Mouth Rinse BID  . Chlorhexidine Gluconate Cloth  6 each Topical Daily  . insulin aspart  0-9 Units Subcutaneous Q6H  . ipratropium  0.5 mg Nebulization Q6H  . latanoprost  1 drop Both Eyes QHS  . levalbuterol  0.63 mg Nebulization Q6H  . mouth rinse  15 mL Mouth Rinse q12n4p  . methylPREDNISolone (SOLU-MEDROL) injection  40 mg Intravenous Q12H  . metoprolol tartrate  5 mg Intravenous Q6H  . nystatin   Topical BID  . pantoprazole (PROTONIX) IV  40 mg Intravenous Q24H  . QUEtiapine  25 mg Oral QHS  .  sodium chloride flush  10-40 mL Intracatheter Q12H  . sodium chloride flush  3 mL Intravenous Q12H   Continuous Infusions: . sodium chloride    . azithromycin Stopped (04/18/19 2774)  . diltiazem (CARDIZEM) infusion Stopped (04/19/19 0535)  . piperacillin-tazobactam (ZOSYN)  IV 3.375 g (04/19/19 0525)    Procedures/Studies: DG Ankle 2 Views Left  Result Date: 04/07/2019 CLINICAL DATA:  Bimalleolar ankle fracture, reduction EXAM: LEFT ANKLE - 2 VIEW COMPARISON:  04/16/2019 FINDINGS: Frontal and lateral views of the left ankle demonstrate reduction of the bimalleolar ankle fracture seen previously, with near anatomic alignment of the medial and lateral malleoli. The talus is now aligned anatomic Lea with the tibial plafond. Diffuse soft tissue edema. IMPRESSION: 1. Near anatomic alignment of the bimalleolar fracture seen previously. 2. Anatomic alignment of the tibiotalar joint. Electronically Signed   By: Randa Ngo M.D.   On: 03/23/2019 23:11   DG Ankle Complete Left  Result Date: 03/25/2019 CLINICAL DATA:  [place], pain and bruising, visible deformity EXAM: LEFT ANKLE COMPLETE - 3+ VIEW COMPARISON:  None. FINDINGS: Frontal and cross-table lateral views of  the left ankle are obtained. Mildly comminuted oblique fracture of the distal fibular diaphysis is noted. There is a comminuted transverse fracture of the medial malleolus. There is lateral displacement of the medial and lateral malleolar fracture fragments, with severe lateral subluxation of the talus relative to the tibial plafond. There is diffuse soft tissue edema. IMPRESSION: 1. Bimalleolar fracture with severe lateral subluxation of the talus. Electronically Signed   By: Randa Ngo M.D.   On: 03/28/2019 18:54   CT Head Wo Contrast  Result Date: 03/31/2019 CLINICAL DATA:  86 year old female status post syncope and fall. EXAM: CT HEAD WITHOUT CONTRAST TECHNIQUE: Contiguous axial images were obtained from the base of the skull through the vertex without intravenous contrast. COMPARISON:  None. FINDINGS: Brain: Cerebral volume is within normal limits for age. No midline shift, ventriculomegaly, mass effect, evidence of mass lesion, intracranial hemorrhage or evidence of cortically based acute infarction. Possible small focus of subependymal gray matter heterotopia at the left lateral ventricle on series 2, image 15. Elsewhere gray-white matter differentiation appears normal for age. Vascular: Calcified atherosclerosis at the skull base. No suspicious intracranial vascular hyperdensity. Skull: Negative, no skull fracture identified. Sinuses/Orbits: Bilateral mastoid effusions and middle ear opacification, appears to be inflammatory. Negative visible nasopharynx. Visible paranasal sinuses are clear. Other: No acute orbit or scalp soft tissue injury identified. IMPRESSION: 1. No acute intracranial abnormality or acute traumatic injury identified. 2. Bilateral middle ear and mastoid opacification appears to be inflammatory. Consider otitis media or cholesteatoma. ENT follow-up may be valuable. Electronically Signed   By: Genevie Ann M.D.   On: 03/27/2019 21:42   CT CHEST WO CONTRAST  Result Date:  04/18/2019 CLINICAL DATA:  Evaluate for pneumonia. EXAM: CT CHEST WITHOUT CONTRAST TECHNIQUE: Multidetector CT imaging of the chest was performed following the standard protocol without IV contrast. COMPARISON:  Chest radiograph 04/17/2019; chest CT 08/08/2017 FINDINGS: Cardiovascular: Heart is enlarged. Thoracic aortic calcifications. Left upper extremity PICC line tip terminates in the central left brachiocephalic vein. Mediastinum/Nodes: No enlarged axillary, mediastinal or hilar lymphadenopathy. Postsurgical changes involving the distal esophagus/proximal stomach. Lungs/Pleura: Narrowing of the left mainstem bronchus (image 56; series 4). There is soft tissue density at the level of the left mainstem bronchus narrowing (image 57; series 2). Patchy ground-glass and consolidative opacities demonstrated within the right middle and right lower lobes. Persistent probable area of rounded atelectasis within  the left lower lobe (image 88; series 4). Bandlike consolidation within the medial left upper lobe (image 41; series 4). Patchy opacities within the left lower lobe. No large pleural effusion or pneumothorax. Findings most compatible with prior left hemidiaphragm repair. Upper Abdomen: No acute process. Musculoskeletal: Thoracic spine degenerative changes. IMPRESSION: 1. Narrowing of the left mainstem bronchus with possible soft tissue at the level of narrowing. While findings may be secondary to mucous plugging, intraluminal lesion is not excluded. Consider short-term follow-up chest CT if bronchoscopy is not performed. 2. Patchy ground-glass and consolidative opacities within the right middle and right lower lobes which may represent atelectasis or infection. 3. Bandlike consolidation within the medial left upper lobe which may represent atelectasis. Recommend attention on follow-up. 4. Left upper extremity PICC line tip terminates in the central left brachiocephalic vein. 5. Aortic Atherosclerosis (ICD10-I70.0).  Electronically Signed   By: Lovey Newcomer M.D.   On: 04/18/2019 13:31   CT Cervical Spine Wo Contrast  Result Date: 04/01/2019 CLINICAL DATA:  86 year old female status post syncope and fall. EXAM: CT CERVICAL SPINE WITHOUT CONTRAST TECHNIQUE: Multidetector CT imaging of the cervical spine was performed without intravenous contrast. Multiplanar CT image reconstructions were also generated. COMPARISON:  Head CT today reported separately. Chest CT 08/08/2017. FINDINGS: Alignment: Mild reversal of cervical lordosis. Subtle anterolisthesis at C3-C4 and C4-C5 appears to be degenerative in nature with associated facet arthropathy. Cervicothoracic junction alignment is within normal limits. Bilateral posterior element alignment is within normal limits. Skull base and vertebrae: Visualized skull base is intact. No atlanto-occipital dissociation. No acute osseous abnormality identified. Mild spina bifida occulta at C6 (normal variant). Soft tissues and spinal canal: No prevertebral fluid or swelling. No visible canal hematoma. Negative noncontrast neck soft tissues, partially retropharyngeal carotids. Disc levels: Widespread bilateral cervical facet arthropathy. Comparatively mild chronic disc and endplate degeneration. No significant spinal stenosis suspected. Upper chest: Visible upper thoracic levels appear intact. There is confluent new lung opacity in the left apex on series 4, image 72 when compared to the 2019 chest CT. This does resemble atelectasis. Negative visible superior mediastinum. IMPRESSION: 1. No acute traumatic injury identified in the cervical spine. 2. New nonspecific left apical lung opacity (series 4, image 72) when compared to a 2019 Chest CT. Query cough. Consider a repeat Chest CT (noncontrast may suffice) in 2-3 months to re-evaluate. Electronically Signed   By: Genevie Ann M.D.   On: 03/28/2019 21:48   DG CHEST PORT 1 VIEW  Result Date: 04/17/2019 CLINICAL DATA:  Shortness of breath. EXAM:  PORTABLE CHEST 1 VIEW COMPARISON:  03/24/2019. FINDINGS: Mediastinum and hilar structures are stable. Cardiomegaly. Pulmonary vascular congestion. Prominent left base atelectasis and consolidation. This could be from bronchial obstruction as the left mainstem bronchus appears to be truncated. Prominent left pleural effusion. No pneumothorax. No acute bony abnormality. Surgical clips upper abdomen. IMPRESSION: 1. Left mainstem bronchus truncation with prominent left base atelectasis and consolidation. Moderate left pleural effusion. 2.  Cardiomegaly with pulmonary venous congestion. Electronically Signed   By: Marcello Moores  Register   On: 04/17/2019 08:38   DG Chest Portable 1 View  Result Date: 03/23/2019 CLINICAL DATA:  Shortness of breath, left ankle fracture EXAM: PORTABLE CHEST 1 VIEW COMPARISON:  10/28/2018 FINDINGS: Single frontal view of the chest demonstrates persistent enlargement of the cardiac silhouette. There is chronic central vascular congestion, with chronic left basilar consolidation unchanged. Small bilateral pleural effusions versus pleural thickening, left greater than right, again noted. No pneumothorax. No acute bony abnormality.  IMPRESSION: 1. Chronic vascular congestion. 2. Chronic left basilar consolidation compatible with atelectasis or scarring. 3. Stable small bilateral pleural effusions versus pleural thickening. Electronically Signed   By: Randa Ngo M.D.   On: 04/08/2019 23:12   ECHOCARDIOGRAM COMPLETE  Result Date: 04/17/2019    ECHOCARDIOGRAM REPORT   Patient Name:   KAEYA SCHIFFER Date of Exam: 04/17/2019 Medical Rec #:  [id number]       Height:       61.0 in Accession #:    [id number]      Weight:       221.8 lb Date of Birth:  [birth year]       BSA:          1.974 m Patient Age:    38 years        BP:           133/66 mmHg Patient Gender: F               HR:           114 bpm. Exam Location:  [facility] Procedure: 2D Echo Indications:    CHF-Acute Diastolic 287.86 / V67.20   History:        Patient has prior history of Echocardiogram examinations, most                 recent 03/08/2017. CHF, COPD, Arrythmias:Atrial Fibrillation,                 Signs/Symptoms:Chest Pain; Risk Factors:Diabetes. Pulmonary                 hypertension , Chronic respiratory failure.  Sonographer:    Leavy Cella RDCS (AE) Referring Phys: [phone] Maedell Hedger IMPRESSIONS  1. Left ventricular ejection fraction, by estimation, is 55 to 60%. The left ventricle has normal function. The left ventricle has no regional wall motion abnormalities. Left ventricular diastolic parameters are indeterminate.  2. There is flattening of the ventricular septum in systole consistent with RV pressure overload. . Right ventricular systolic function is mildly reduced. The right ventricular size is moderately enlarged. There is mildly elevated pulmonary artery systolic pressure.  3. Left atrial size was mild to moderately dilated.  4. Right atrial size was mildly dilated.  5. The mitral valve is normal in structure. No evidence of mitral valve regurgitation. No evidence of mitral stenosis.  6. The aortic valve is tricuspid. Aortic valve regurgitation is not visualized. No aortic stenosis is present.  7. Indeterminant PASP, inadequate TR jet. FINDINGS  Left Ventricle: Left ventricular ejection fraction, by estimation, is 55 to 60%. The left ventricle has normal function. The left ventricle has no regional wall motion abnormalities. The left ventricular internal cavity size was normal in size. There is  no left ventricular hypertrophy. Left ventricular diastolic parameters are indeterminate. Right Ventricle: There is flattening of the ventricular septum in systole consistent with RV pressure overload. The right ventricular size is moderately enlarged. Right vetricular wall thickness was not assessed. Right ventricular systolic function is mildly reduced. There is mildly elevated pulmonary artery systolic pressure. The tricuspid  regurgitant velocity is 2.35 m/s, and with an assumed right atrial pressure of 10 mmHg, the estimated right ventricular systolic pressure is 96.2 mmHg. Left Atrium: Left atrial size was mild to moderately dilated. Right Atrium: Right atrial size was mildly dilated. Pericardium: There is no evidence of pericardial effusion. Mitral Valve: The mitral valve is normal in structure. No evidence of mitral valve regurgitation. No evidence  of mitral valve stenosis. Tricuspid Valve: The tricuspid valve is not well visualized. Tricuspid valve regurgitation is trivial. No evidence of tricuspid stenosis. Aortic Valve: The aortic valve is tricuspid. Aortic valve regurgitation is not visualized. No aortic stenosis is present. Aortic valve mean gradient measures 4.7 mmHg. Aortic valve peak gradient measures 10.5 mmHg. Aortic valve area, by VTI measures 2.37  cm. Pulmonic Valve: The pulmonic valve was not well visualized. Pulmonic valve regurgitation is not visualized. No evidence of pulmonic stenosis. Aorta: The aortic root is normal in size and structure. Pulmonary Artery: Indeterminant PASP, inadequate TR jet. Venous: The inferior vena cava was not well visualized. IAS/Shunts: The interatrial septum was not well visualized.  LEFT VENTRICLE PLAX 2D LVIDd:         4.78 cm  Diastology LVIDs:         3.63 cm  LV e' lateral:   10.00 cm/s LV PW:         0.94 cm  LV E/e' lateral: 8.3 LV IVS:        0.75 cm  LV e' medial:    5.66 cm/s LVOT diam:     1.90 cm  LV E/e' medial:  14.6 LV SV:         62 LV SV Index:   32 LVOT Area:     2.84 cm  RIGHT VENTRICLE RV S prime:     10.40 cm/s TAPSE (M-mode): 1.3 cm LEFT ATRIUM             Index LA diam:        3.70 cm 1.87 cm/m LA Vol (A2C):   80.9 ml 40.99 ml/m LA Vol (A4C):   56.4 ml 28.58 ml/m LA Biplane Vol: 67.9 ml 34.40 ml/m  AORTIC VALVE AV Area (Vmax):    1.67 cm AV Area (Vmean):   1.79 cm AV Area (VTI):     2.37 cm AV Vmax:           161.68 cm/s AV Vmean:          99.684 cm/s AV  VTI:            0.263 m AV Peak Grad:      10.5 mmHg AV Mean Grad:      4.7 mmHg LVOT Vmax:         94.98 cm/s LVOT Vmean:        63.023 cm/s LVOT VTI:          0.220 m LVOT/AV VTI ratio: 0.84  AORTA Ao Root diam: 2.50 cm MITRAL VALVE               TRICUSPID VALVE MV Area (PHT): 5.02 cm    TR Peak grad:   22.1 mmHg MV Decel Time: 151 msec    TR Vmax:        235.00 cm/s MV E velocity: 82.70 cm/s MV A velocity: 81.00 cm/s  SHUNTS MV E/A ratio:  1.02        Systemic VTI:  0.22 m                            Systemic Diam: 1.90 cm Carlyle Dolly MD Electronically signed by Carlyle Dolly MD Signature Date/Time: 04/17/2019/11:23:36 AM    Final    [place] EKG SITE RITE  Result Date: 04/17/2019 If Site Rite image not attached, placement could not be confirmed due to current cardiac rhythm.   Orson Eva, DO  [facility]  If 7PM-7AM, please contact night-coverage [email or web address] Password [email or web address] 04/19/2019, 8:14 AM   LOS: 3 days

## 2019-04-19 NOTE — Progress Notes (Signed)
Received report from Nash Mantis, nightshift RN. Pt had been agitated, restless, and trying to take O2 canula out, causing pt's O2 saturations to drop so Haldol administered at 0634. Pt now resting however O2 saturations continued to be decreased in the 70's to 80's while on 45 L and 70% FiO2. Pt is a mouth-breather therefore, Non-rebreather mask at 10 L placed on patient. O2 saturations immediately increased to 98% on non-rebreather mask and pt resting.

## 2019-04-19 NOTE — Progress Notes (Signed)
Pt yelling for help. When this RN checks on patient she tells me that she can't breathe. Oxygen is in place at 40L 60% FiO2 and saturating at 92%; RR 25. Pt has been coughing more throughout the shift that sounds more productive and congested yet I am unable to orally suction any secretions. Respiratory notified. Will continue to monitor patient.

## 2019-04-19 NOTE — Progress Notes (Signed)
Pt more alert this evening and attempting to communicate needs. Hollers out for Mikki Santee who is sitting next to her. Son is also present at this time. Pt is easily redirected. Since pt more alert attempted to transition patient from Non-rebreather to High Flow [place]. On 15 L HFNC pt still desaturated down to 75%. Pt was able to cough and take deep breaths when instructed but, pt also continued to mouth-breath. Non-rebreather placed back on 10 L and saturations increased to 100%. Pt calm and resting with family at bedside.

## 2019-04-19 NOTE — Progress Notes (Signed)
Pt expressing discomfort with being in the bed. Doesn't understand why she can't lay back. Explained that if I lay her back, she is unable to breathe as well. Laid patient back and she immediately decompensates. I turned the FiO2 up to help her compensate. She is asking for food and water. Advised her that right now I was not able to give her anything to eat or drink. This RN is providing mouth care to the patient. She again expresses how "miserable" this is. Monitoring patient.

## 2019-04-20 DIAGNOSIS — I480 Paroxysmal atrial fibrillation: Secondary | ICD-10-CM

## 2019-04-20 DIAGNOSIS — J9690 Respiratory failure, unspecified, unspecified whether with hypoxia or hypercapnia: Secondary | ICD-10-CM

## 2019-04-20 DIAGNOSIS — Z515 Encounter for palliative care: Secondary | ICD-10-CM

## 2019-04-20 DIAGNOSIS — S82899A Other fracture of unspecified lower leg, initial encounter for closed fracture: Secondary | ICD-10-CM

## 2019-04-20 LAB — COMPREHENSIVE METABOLIC PANEL
ALT: 19 U/L (ref 0–44)
AST: 15 U/L (ref 15–41)
Albumin: 3.1 g/dL — ABNORMAL LOW (ref 3.5–5.0)
Alkaline Phosphatase: 73 U/L (ref 38–126)
Anion gap: 12 (ref 5–15)
BUN: 49 mg/dL — ABNORMAL HIGH (ref 8–23)
CO2: 36 mmol/L — ABNORMAL HIGH (ref 22–32)
Calcium: 9.6 mg/dL (ref 8.9–10.3)
Chloride: 105 mmol/L (ref 98–111)
Creatinine, Ser: 1.23 mg/dL — ABNORMAL HIGH (ref 0.44–1.00)
GFR calc Af Amer: 47 mL/min — ABNORMAL LOW (ref >60)
GFR calc non Af Amer: 41 mL/min — ABNORMAL LOW (ref >60)
Glucose, Bld: 207 mg/dL — ABNORMAL HIGH (ref 70–99)
Potassium: 4.4 mmol/L (ref 3.5–5.1)
Sodium: 153 mmol/L — ABNORMAL HIGH (ref 135–145)
Total Bilirubin: 1 mg/dL (ref 0.3–1.2)
Total Protein: 6.2 g/dL — ABNORMAL LOW (ref 6.5–8.1)

## 2019-04-20 LAB — CBC WITH DIFFERENTIAL/PLATELET
Abs Immature Granulocytes: 0.29 10*3/uL — ABNORMAL HIGH (ref 0.00–0.07)
Basophils Absolute: 0 10*3/uL (ref 0.0–0.1)
Basophils Relative: 0 %
Eosinophils Absolute: 0 10*3/uL (ref 0.0–0.5)
Eosinophils Relative: 0 %
HCT: 31.2 % — ABNORMAL LOW (ref 36.0–46.0)
Hemoglobin: 8.4 g/dL — ABNORMAL LOW (ref 12.0–15.0)
Immature Granulocytes: 2 %
Lymphocytes Relative: 5 %
Lymphs Abs: 0.7 10*3/uL (ref 0.7–4.0)
MCH: 24.5 pg — ABNORMAL LOW (ref 26.0–34.0)
MCHC: 26.9 g/dL — ABNORMAL LOW (ref 30.0–36.0)
MCV: 91 fL (ref 80.0–100.0)
Monocytes Absolute: 0.8 10*3/uL (ref 0.1–1.0)
Monocytes Relative: 6 %
Neutro Abs: 11 10*3/uL — ABNORMAL HIGH (ref 1.7–7.7)
Neutrophils Relative %: 87 %
Platelets: 256 10*3/uL (ref 150–400)
RBC: 3.43 MIL/uL — ABNORMAL LOW (ref 3.87–5.11)
RDW: 22.4 % — ABNORMAL HIGH (ref 11.5–15.5)
WBC: 12.8 10*3/uL — ABNORMAL HIGH (ref 4.0–10.5)
nRBC: 3 % — ABNORMAL HIGH (ref 0.0–0.2)

## 2019-04-20 LAB — GLUCOSE, CAPILLARY
Glucose-Capillary: 178 mg/dL — ABNORMAL HIGH (ref 70–99)
Glucose-Capillary: 182 mg/dL — ABNORMAL HIGH (ref 70–99)
Glucose-Capillary: 182 mg/dL — ABNORMAL HIGH (ref 70–99)
Glucose-Capillary: 184 mg/dL — ABNORMAL HIGH (ref 70–99)
Glucose-Capillary: 198 mg/dL — ABNORMAL HIGH (ref 70–99)

## 2019-04-20 MED ORDER — OLANZAPINE 5 MG PO TBDP
2.5000 mg | ORAL_TABLET | Freq: Every day | ORAL | Status: DC
  Administered 2019-04-20: 21:00:00 2.5 mg via SUBLINGUAL
  Filled 2019-04-20 (×2): qty 0.5

## 2019-04-20 MED ORDER — LEVALBUTEROL HCL 0.63 MG/3ML IN NEBU: 0.6300 mg | INHALATION_SOLUTION | Freq: Three times a day (TID) | RESPIRATORY_TRACT | Status: DC

## 2019-04-20 MED ORDER — GLYCOPYRROLATE 0.2 MG/ML IJ SOLN
0.2000 mg | Freq: Three times a day (TID) | INTRAMUSCULAR | Status: DC
  Administered 2019-04-20 – 2019-04-21 (×3): 0.2 mg via INTRAVENOUS
  Filled 2019-04-20 (×3): qty 1

## 2019-04-20 MED ORDER — IPRATROPIUM BROMIDE 0.02 % IN SOLN: 0.5000 mg | Freq: Three times a day (TID) | RESPIRATORY_TRACT | Status: DC

## 2019-04-20 MED ORDER — FENTANYL CITRATE (PF) 100 MCG/2ML IJ SOLN
12.5000 ug | INTRAMUSCULAR | Status: DC | PRN
  Administered 2019-04-20 – 2019-04-21 (×3): 12.5 ug via INTRAVENOUS
  Filled 2019-04-20 (×3): qty 2

## 2019-04-20 NOTE — Care Management Important Message (Signed)
Important Message  Patient Details  Name: Becky Newton MRN: [id number] Date of Birth: October 10, 1933   Medicare Important Message Given:  Yes     Tommy Medal 04/20/2019, 2:26 PM

## 2019-04-20 NOTE — Consult Note (Signed)
Consultation Note Date: 04/20/2019   Patient Name: Becky Newton  DOB: 1933/02/01  MRN: [id number]  Age / Sex: 86 y.o., female  PCP: [name], NP Referring Physician: Orson Eva, MD  Reason for Consultation: Establishing goals of care  HPI/Patient Profile: 86 y.o. female  with past medical history of COPD on 6L oxygen at home, atrial fibrillation, diastolic CHF, OSA, diabetes admitted on 03/28/2019 with fall and left ankle fracture after recent oxycodone and Robaxin for right hip/leg pain. Recommendation was for transfer to [facility] for reduction and splint placement but she became hypoxic and required BiPAP in ED with hypoventilation, COPD, fluid overload, and possible pneumonia. Transfer to [facility] was cancelled. Tenuous course with recurrent hypoxia, confusion, agitation. BiPAP has been discontinued at family request. Family considering home with hospice care.   Clinical Assessment and Goals of Care: I have reviewed records and discussed with bedside RN.   I met today at [name] bedside with her husband and son, Becky Newton and Becky Newton. We discussed her current status of lethargy but with confusion and agitation. She is requiring multiple doses of Ativan and haldol over past 24 hours to try and help with her agitation. This is likely contributing to hypoventilation and CO2 retention. Family understands that this is likely having a cyclic effect on [name] overall condition. She did not tolerate BiPAP and they do not want to put her through this again. They want her comfortable but also want to ensure that we are giving her any opportunity to improve if possible. They are very realistic and have strong faith that provides them comfort and peace with situation.   We agree to continue antibiotics and current interventions as well as adding other medications to ensure her comfort. If no improvement  tomorrow we may consider transition to hospice facility (difficult to care for her at home in this condition and family agree). Also will allow for liberalized family visitation at this time given tenuous state and focus of care on comfort.   All questions/concerns addressed. Emotional support provided. Discussed with RN and Dr. Carles Collet.   Primary Decision Maker NEXT OF KIN husband and son    SUMMARY OF RECOMMENDATIONS   - Continue current therapies - Addition of medications to ensure comfort and minimize suffering - Consider transition to hospice facility in the near future  Code Status/Advance Care Planning:  DNR   Symptom Management:   Robinul scheduled for secretion management.   Pain/Shortness of breath: Fentanyl 12.5-25 mcg IV every 2 hours as needed.   Agitation: Trial of Zyprexa 2.5 mg sublingual daily at bedside.  Palliative Prophylaxis:   Aspiration, Bowel Regimen, Delirium Protocol, Eye Care, Frequent Pain Assessment, Oral Care and Turn Reposition   Psycho-social/Spiritual:   Desire for further Chaplaincy support:yes  Additional Recommendations: Education on Hospice and Grief/Bereavement Support  Prognosis:   Overall prognosis is extremely poor. Would be eligible for hospice facility.   Discharge Planning: To Be Determined      Primary Diagnoses: Present on Admission: . Paroxysmal  atrial fibrillation ([place]) . Pulmonary hypertension due to lung disease ([place]) . Morbid obesity ([place]) . OSA (obstructive sleep apnea) . COPD with acute exacerbation ([place]) . Physical deconditioning . Respiratory failure with hypercapnia ([place]) . Acute on chronic respiratory failure with hypoxia and hypercapnia ([facility]) . Chronic diastolic CHF (congestive heart failure) ([place])   I have reviewed the medical record, interviewed the patient and family, and examined the patient. The following aspects are pertinent.  Past Medical History:  Diagnosis Date  . A-fib ([place])   . Atrial  fibrillation ([place])   . Chicken pox   . COPD (chronic obstructive pulmonary disease) ([place])   . Diabetes ([place])   . DNR (do not resuscitate) 2020   [place] [facility] on 12/29/2018, advanced care planning documents in chart  . Fibrocystic breast determined by biopsy 1977  . GERD (gastroesophageal reflux disease)   . Glaucoma   . H/O hernia repair 2006  . H/O left breast biopsy 1982  . Incisional hernia   . Lung disease    Paralyzed left hemidiaphragm  . S/P scar revision   . Uterine cancer [facility])    Social History   Socioeconomic History  . Marital status: Married    Spouse name: Not on file  . Number of children: Not on file  . Years of education: Not on file  . Highest education level: Not on file  Occupational History  . Not on file  Tobacco Use  . Smoking status: Former Smoker    Packs/day: 0.50    Years: 36.00    Pack years: 18.00    Types: Cigarettes    Start date: 10    Quit date: 08/23/1989    Years since quitting: 29.6  . Smokeless tobacco: Never Used  Substance and Sexual Activity  . Alcohol use: No    Alcohol/week: 0.0 standard drinks  . Drug use: No  . Sexual activity: Never    Partners: Male  Other Topics Concern  . Not on file  Social History Narrative  . Not on file   Social Determinants of Health   Financial Resource Strain:   . Difficulty of Paying Living Expenses:   Food Insecurity:   . Worried About Charity fundraiser in the Last Year:   . Arboriculturist in the Last Year:   Transportation Needs:   . Film/video editor (Medical):   Marland Kitchen Lack of Transportation (Non-Medical):   Physical Activity:   . Days of Exercise per Week:   . Minutes of Exercise per Session:   Stress:   . Feeling of Stress :   Social Connections:   . Frequency of Communication with Friends and Family:   . Frequency of Social Gatherings with Friends and Family:   . Attends Religious Services:   . Active Member of Clubs or Organizations:   . Attends Archivist  Meetings:   Marland Kitchen Marital Status:    Family History  Problem Relation Age of Onset  . Breast cancer Mother   . Arthritis Mother   . Stroke Mother   . Heart attack Mother   . Heart disease Mother   . Alcohol abuse Brother    Scheduled Meds: . apixaban  5 mg Oral BID  . arformoterol  15 mcg Nebulization BID  . budesonide (PULMICORT) nebulizer solution  0.5 mg Nebulization BID  . chlorhexidine  15 mL Mouth Rinse BID  . Chlorhexidine Gluconate Cloth  6 each Topical Daily  . insulin aspart  0-9 Units  Subcutaneous Q6H  . ipratropium  0.5 mg Nebulization Q6H  . latanoprost  1 drop Both Eyes QHS  . levalbuterol  0.63 mg Nebulization Q6H  . mouth rinse  15 mL Mouth Rinse q12n4p  . methylPREDNISolone (SOLU-MEDROL) injection  40 mg Intravenous Q12H  . metoprolol tartrate  5 mg Intravenous Q6H  . nystatin   Topical BID  . pantoprazole (PROTONIX) IV  40 mg Intravenous Q24H  . QUEtiapine  25 mg Oral QHS  . sodium chloride flush  10-40 mL Intracatheter Q12H  . sodium chloride flush  3 mL Intravenous Q12H   Continuous Infusions: . sodium chloride    . diltiazem (CARDIZEM) infusion Stopped (04/19/19 0535)  . piperacillin-tazobactam (ZOSYN)  IV 12.5 mL/hr at 04/20/19 0700   PRN Meds:.sodium chloride, acetaminophen **OR** acetaminophen, bisacodyl, haloperidol lactate, LORazepam, ondansetron **OR** ondansetron (ZOFRAN) IV, sodium chloride flush, sodium chloride flush Allergies  Allergen Reactions  . Ivp Dye [Iodinated Diagnostic Agents] Nausea And Vomiting   Review of Systems  Unable to perform ROS: Acuity of condition    Physical Exam Vitals and nursing note reviewed.  Constitutional:      Appearance: She is ill-appearing.     Comments: Pale, agitated, confused  Cardiovascular:     Rate and Rhythm: Normal rate.  Pulmonary:     Effort: Tachypnea and accessory muscle usage present.  Abdominal:     General: Abdomen is flat.  Neurological:     Mental Status: She is disoriented and  confused.     Vital Signs: BP (!) 154/79   Pulse 81   Temp 99 F (37.2 C)   Resp (!) 42   Ht 5' 1" (1.549 m)   Wt 96.3 kg   SpO2 91%   BMI 40.11 kg/m  Pain Scale: PAINAD   Pain Score: Asleep   SpO2: SpO2: 91 % O2 Device:SpO2: 91 % O2 Flow Rate: .O2 Flow Rate (L/min): 10 L/min  IO: Intake/output summary:   Intake/Output Summary (Last 24 hours) at 04/20/2019 1154 Last data filed at 04/20/2019 0700 Gross per 24 hour  Intake 347.63 ml  Output 450 ml  Net -102.37 ml    LBM: Last BM Date: 04/17/19 Baseline Weight: Weight: 97.1 kg Most recent weight: Weight: 96.3 kg     Palliative Assessment/Data:     Time In: 1150 Time Out: 1300 Time Total: 70 min Greater than 50%  of this time was spent counseling and coordinating care related to the above assessment and plan.  Signed by: Vinie Sill, NP Palliative Medicine Team Pager # [phone] (M-F 8a-5p) Team Phone # [phone] (Nights/Weekends)

## 2019-04-20 NOTE — Telephone Encounter (Signed)
Noted.

## 2019-04-20 NOTE — Progress Notes (Signed)
Pt only had 100 cc in [place] catheter for whole shift. Pt bed pad was soaked, catheter seemed to be leaking. Balloon inflated with 10 cc of normal saline. Night shift RN made aware. Will continue to monitor.

## 2019-04-20 NOTE — Consult Note (Signed)
CARDIOLOGY CONSULT NOTE       Patient ID: Becky Newton MRN: [id number] DOB/AGE: 1933/10/27 86 y.o.  Admit date: 04/02/2019 Referring Physician: Tat Primary Physician: [name], NP Primary Cardiologist: [name] Reason for Consultation: AFib  Principal Problem:   Acute on chronic respiratory failure with hypercapnia ([place]) Active Problems:   Type 2 diabetes mellitus without complication ([place])   History of gastric ulcer   History of Nissen fundoplication   Pulmonary hypertension due to lung disease ([place])   OSA (obstructive sleep apnea)   Paroxysmal atrial fibrillation ([place])   Morbid obesity ([place])   COPD with acute exacerbation ([place])   Chronic diastolic CHF (congestive heart failure) ([place])   Physical deconditioning   Ankle fracture   Respiratory failure with hypercapnia (HCC)   Acute on chronic respiratory failure with hypoxia and hypercapnia (HCC)   Lobar pneumonia ([place])   Acute renal failure superimposed on stage 3a chronic kidney disease ([place])   Goals of care, counseling/discussion   COPD exacerbation ([place])   Cor pulmonale (chronic) (HCC)   HPI:  86 y.o. moribound female admitted with respiratory failure and PAF. Her husband does total care for her She is chronically on 6L oxygen She had mechanical fall with left ankle fracture now in splint. She has encephalopathy and husband gives total history. She had a brief period of PaF and has converted to NSR She is a DNR. Rhythm is currently stable TTE done 04/17/19 showed normal EF 55-60% RV pressure overload moderate LAE and no significant valve disease Baseline CO2 is in 58 range Worse with opiodes given for pain/ fracture   ROS All other systems reviewed and negative except as noted above  Past Medical History:  Diagnosis Date  . A-fib ([place])   . Atrial fibrillation ([place])   . Chicken pox   . COPD (chronic obstructive pulmonary disease) ([place])   . Diabetes ([place])   . DNR (do not resuscitate) 2020   [facility] on  12/29/2018, advanced care planning documents in chart  . Fibrocystic breast determined by biopsy 1977  . GERD (gastroesophageal reflux disease)   . Glaucoma   . H/O hernia repair 2006  . H/O left breast biopsy 1982  . Incisional hernia   . Lung disease    Paralyzed left hemidiaphragm  . S/P scar revision   . Uterine cancer ([place])     Family History  Problem Relation Age of Onset  . Breast cancer Mother   . Arthritis Mother   . Stroke Mother   . Heart attack Mother   . Heart disease Mother   . Alcohol abuse Brother     Social History   Socioeconomic History  . Marital status: Married    Spouse name: Not on file  . Number of children: Not on file  . Years of education: Not on file  . Highest education level: Not on file  Occupational History  . Not on file  Tobacco Use  . Smoking status: Former Smoker    Packs/day: 0.50    Years: 36.00    Pack years: 18.00    Types: Cigarettes    Start date: 2    Quit date: 08/23/1989    Years since quitting: 29.6  . Smokeless tobacco: Never Used  Substance and Sexual Activity  . Alcohol use: No    Alcohol/week: 0.0 standard drinks  . Drug use: No  . Sexual activity: Never    Partners: Male  Other Topics Concern  . Not on file  Social History Narrative  . Not on file   Social Determinants of Health   Financial Resource Strain:   . Difficulty of Paying Living Expenses:   Food Insecurity:   . Worried About Charity fundraiser in the Last Year:   . Arboriculturist in the Last Year:   Transportation Needs:   . Film/video editor (Medical):   Marland Kitchen Lack of Transportation (Non-Medical):   Physical Activity:   . Days of Exercise per Week:   . Minutes of Exercise per Session:   Stress:   . Feeling of Stress :   Social Connections:   . Frequency of Communication with Friends and Family:   . Frequency of Social Gatherings with Friends and Family:   . Attends Religious Services:   . Active Member of Clubs or Organizations:    . Attends Archivist Meetings:   Marland Kitchen Marital Status:   Intimate Partner Violence:   . Fear of Current or Ex-Partner:   . Emotionally Abused:   Marland Kitchen Physically Abused:   . Sexually Abused:     Past Surgical History:  Procedure Laterality Date  . APPENDECTOMY  1966  . BREAST BIOPSY    . CATARACT EXTRACTION  2004,2005  . CHOLECYSTECTOMY  1975  . HERNIA REPAIR  2006  . HIATAL HERNIA REPAIR  1966  . NISSEN FUNDOPLICATION    . TOTAL ABDOMINAL HYSTERECTOMY W/ BILATERAL SALPINGOOPHORECTOMY  1974   hx of cancer       Current Facility-Administered Medications:  .  0.9 %  sodium chloride infusion, 250 mL, Intravenous, PRN, Lynetta Mare, MD .  acetaminophen (TYLENOL) tablet 650 mg, 650 mg, Oral, Q6H PRN **OR** acetaminophen (TYLENOL) suppository 650 mg, 650 mg, Rectal, Q6H PRN, Lynetta Mare, MD, 650 mg at 04/18/19 1258 .  apixaban (ELIQUIS) tablet 5 mg, 5 mg, Oral, BID, Tat, David, MD, 5 mg at 04/18/19 0900 .  arformoterol (BROVANA) nebulizer solution 15 mcg, 15 mcg, Nebulization, BID, Tat, David, MD, 15 mcg at 04/19/19 0800 .  azithromycin (ZITHROMAX) 500 mg in sodium chloride 0.9 % 250 mL IVPB, 500 mg, Intravenous, Q24H, Orson Eva, MD, Stopped at 04/19/19 [phone] .  bisacodyl (DULCOLAX) suppository 10 mg, 10 mg, Rectal, Daily PRN, Lynetta Mare, MD .  budesonide (PULMICORT) nebulizer solution 0.5 mg, 0.5 mg, Nebulization, BID, Tat, David, MD, 0.5 mg at 04/19/19 0109 .  chlorhexidine (PERIDEX) 0.12 % solution 15 mL, 15 mL, Mouth Rinse, BID, Tat, David, MD, 15 mL at 04/19/19 2329 .  Chlorhexidine Gluconate Cloth 2 % PADS 6 each, 6 each, Topical, Daily, Tat, David, MD, 6 each at 04/19/19 [phone] .  diltiazem (CARDIZEM) 125 mg in dextrose 5% 125 mL (1 mg/mL) infusion, 5-15 mg/hr, Intravenous, Titrated, Lynetta Mare, MD, Stopped at 04/19/19 0535 .  haloperidol lactate (HALDOL) injection 5 mg, 5 mg, Intravenous, Q6H PRN, Tat, David, MD, 5 mg at 04/19/19 2320 .  insulin aspart  (novoLOG) injection 0-9 Units, 0-9 Units, Subcutaneous, Q6H, Lynetta Mare, MD, 2 Units at 04/20/19 0630 .  ipratropium (ATROVENT) nebulizer solution 0.5 mg, 0.5 mg, Nebulization, Q6H, Tat, David, MD, 0.5 mg at 04/19/19 0751 .  latanoprost (XALATAN) 0.005 % ophthalmic solution 1 drop, 1 drop, Both Eyes, QHS, Lynetta Mare, MD, 1 drop at 04/20/19 0024 .  levalbuterol (XOPENEX) nebulizer solution 0.63 mg, 0.63 mg, Nebulization, Q6H, Tat, David, MD, 0.63 mg at 04/19/19 0751 .  LORazepam (ATIVAN) injection 0.5 mg, 0.5 mg, Intravenous, Q4H PRN, Tat,  Shanon Brow, MD, 0.5 mg at 04/20/19 0013 .  MEDLINE mouth rinse, 15 mL, Mouth Rinse, q12n4p, Tat, David, MD, 15 mL at 04/19/19 1505 .  methylPREDNISolone sodium succinate (SOLU-MEDROL) 40 mg/mL injection 40 mg, 40 mg, Intravenous, Q12H, Lynetta Mare, MD, 40 mg at 04/20/19 0534 .  metoprolol tartrate (LOPRESSOR) injection 5 mg, 5 mg, Intravenous, Q6H, Tat, David, MD, 5 mg at 04/20/19 0530 .  nystatin (MYCOSTATIN/NYSTOP) topical powder, , Topical, BID, Lynetta Mare, MD, Given at 04/19/19 2327 .  ondansetron (ZOFRAN) tablet 4 mg, 4 mg, Oral, Q6H PRN **OR** ondansetron (ZOFRAN) injection 4 mg, 4 mg, Intravenous, Q6H PRN, Lynetta Mare, MD .  pantoprazole (PROTONIX) injection 40 mg, 40 mg, Intravenous, Q24H, Tanda Rockers, MD, 40 mg at 04/19/19 1452 .  piperacillin-tazobactam (ZOSYN) IVPB 3.375 g, 3.375 g, Intravenous, Q8H, Tat, David, MD, Last Rate: 12.5 mL/hr at 04/20/19 0700, Rate Verify at 04/20/19 0700 .  QUEtiapine (SEROQUEL) tablet 25 mg, 25 mg, Oral, QHS, Tat, David, MD .  sodium chloride flush (NS) 0.9 % injection 10-40 mL, 10-40 mL, Intracatheter, Q12H, Tat, David, MD, 10 mL at 04/19/19 2330 .  sodium chloride flush (NS) 0.9 % injection 10-40 mL, 10-40 mL, Intracatheter, PRN, Tat, David, MD .  sodium chloride flush (NS) 0.9 % injection 3 mL, 3 mL, Intravenous, Q12H, Lynetta Mare, MD, 3 mL at 04/19/19 2330 .  sodium chloride flush  (NS) 0.9 % injection 3 mL, 3 mL, Intravenous, PRN, Lynetta Mare, MD . apixaban  5 mg Oral BID  . arformoterol  15 mcg Nebulization BID  . budesonide (PULMICORT) nebulizer solution  0.5 mg Nebulization BID  . chlorhexidine  15 mL Mouth Rinse BID  . Chlorhexidine Gluconate Cloth  6 each Topical Daily  . insulin aspart  0-9 Units Subcutaneous Q6H  . ipratropium  0.5 mg Nebulization Q6H  . latanoprost  1 drop Both Eyes QHS  . levalbuterol  0.63 mg Nebulization Q6H  . mouth rinse  15 mL Mouth Rinse q12n4p  . methylPREDNISolone (SOLU-MEDROL) injection  40 mg Intravenous Q12H  . metoprolol tartrate  5 mg Intravenous Q6H  . nystatin   Topical BID  . pantoprazole (PROTONIX) IV  40 mg Intravenous Q24H  . QUEtiapine  25 mg Oral QHS  . sodium chloride flush  10-40 mL Intracatheter Q12H  . sodium chloride flush  3 mL Intravenous Q12H   . sodium chloride    . azithromycin Stopped (04/19/19 0934)  . diltiazem (CARDIZEM) infusion Stopped (04/19/19 0535)  . piperacillin-tazobactam (ZOSYN)  IV 12.5 mL/hr at 04/20/19 0700    Physical Exam: Blood pressure (!) 160/77, pulse (!) 114, temperature 99.1 F (37.3 C), resp. rate (!) 27, height _0  (1.549 m), weight 96.3 kg, SpO2 93 %.   Encephalopathic  Chronically ill white female Pale  HEENT: normal Neck supple with no adenopathy JVP normal no bruits no thyromegaly Lungs poor diaphragm motion on left poor inspiratory effort  Heart:  S1/S2 no murmur, no rub, gallop or click PMI normal Abdomen: benighn, BS positve, no tenderness, no AAA no bruit.  No HSM or HJR Distal pulses intact with no bruits No edema Neuro non-focal Skin warm and dry No muscular weakness   Labs:   Lab Results  Component Value Date   WBC 12.8 (H) 04/20/2019   HGB 8.4 (L) 04/20/2019   HCT 31.2 (L) 04/20/2019   MCV 91.0 04/20/2019   PLT 256 04/20/2019    Recent Labs  Lab 04/20/19 0440  NA 153*  K 4.4  CL 105  CO2 36*  BUN 49*  CREATININE 1.23*  CALCIUM  9.6  PROT 6.2*  BILITOT 1.0  ALKPHOS 73  ALT 19  AST 15  GLUCOSE 207*   Lab Results  Component Value Date   TROPONINI <0.03 03/08/2017    Lab Results  Component Value Date   CHOL 158 09/23/2018   CHOL 173 09/11/2017   CHOL 164 07/08/2014   Lab Results  Component Value Date   HDL 43.90 09/23/2018   HDL 43.30 09/11/2017   HDL 43 07/08/2014   Lab Results  Component Value Date   LDLCALC 84 09/23/2018   LDLCALC 97 09/11/2017   LDLCALC 89 07/08/2014   Lab Results  Component Value Date   TRIG 150.0 (H) 09/23/2018   TRIG 165.0 (H) 09/11/2017   TRIG 162 (A) 07/08/2014   Lab Results  Component Value Date   CHOLHDL 4 09/23/2018   CHOLHDL 4 09/11/2017   No results found for: LDLDIRECT    Radiology: DG Ankle 2 Views Left  Result Date: 03/27/2019 CLINICAL DATA:  Bimalleolar ankle fracture, reduction EXAM: LEFT ANKLE - 2 VIEW COMPARISON:  04/13/2019 FINDINGS: Frontal and lateral views of the left ankle demonstrate reduction of the bimalleolar ankle fracture seen previously, with near anatomic alignment of the medial and lateral malleoli. The talus is now aligned anatomic [place] with the tibial plafond. Diffuse soft tissue edema. IMPRESSION: 1. Near anatomic alignment of the bimalleolar fracture seen previously. 2. Anatomic alignment of the tibiotalar joint. Electronically Signed   By: Randa Ngo M.D.   On: 04/14/2019 23:11   DG Ankle Complete Left  Result Date: 04/09/2019 CLINICAL DATA:  [place], pain and bruising, visible deformity EXAM: LEFT ANKLE COMPLETE - 3+ VIEW COMPARISON:  None. FINDINGS: Frontal and cross-table lateral views of the left ankle are obtained. Mildly comminuted oblique fracture of the distal fibular diaphysis is noted. There is a comminuted transverse fracture of the medial malleolus. There is lateral displacement of the medial and lateral malleolar fracture fragments, with severe lateral subluxation of the talus relative to the tibial plafond. There is diffuse  soft tissue edema. IMPRESSION: 1. Bimalleolar fracture with severe lateral subluxation of the talus. Electronically Signed   By: Randa Ngo M.D.   On: 04/20/2019 18:54   CT Head Wo Contrast  Result Date: 04/12/2019 CLINICAL DATA:  86 year old female status post syncope and fall. EXAM: CT HEAD WITHOUT CONTRAST TECHNIQUE: Contiguous axial images were obtained from the base of the skull through the vertex without intravenous contrast. COMPARISON:  None. FINDINGS: Brain: Cerebral volume is within normal limits for age. No midline shift, ventriculomegaly, mass effect, evidence of mass lesion, intracranial hemorrhage or evidence of cortically based acute infarction. Possible small focus of subependymal gray matter heterotopia at the left lateral ventricle on series 2, image 15. Elsewhere gray-white matter differentiation appears normal for age. Vascular: Calcified atherosclerosis at the skull base. No suspicious intracranial vascular hyperdensity. Skull: Negative, no skull fracture identified. Sinuses/Orbits: Bilateral mastoid effusions and middle ear opacification, appears to be inflammatory. Negative visible nasopharynx. Visible paranasal sinuses are clear. Other: No acute orbit or scalp soft tissue injury identified. IMPRESSION: 1. No acute intracranial abnormality or acute traumatic injury identified. 2. Bilateral middle ear and mastoid opacification appears to be inflammatory. Consider otitis media or cholesteatoma. ENT follow-up may be valuable. Electronically Signed   By: Genevie Ann M.D.   On: 04/02/2019 21:42   CT CHEST WO CONTRAST  Result Date: 04/18/2019  CLINICAL DATA:  Evaluate for pneumonia. EXAM: CT CHEST WITHOUT CONTRAST TECHNIQUE: Multidetector CT imaging of the chest was performed following the standard protocol without IV contrast. COMPARISON:  Chest radiograph 04/17/2019; chest CT 08/08/2017 FINDINGS: Cardiovascular: Heart is enlarged. Thoracic aortic calcifications. Left upper extremity PICC  line tip terminates in the central left brachiocephalic vein. Mediastinum/Nodes: No enlarged axillary, mediastinal or hilar lymphadenopathy. Postsurgical changes involving the distal esophagus/proximal stomach. Lungs/Pleura: Narrowing of the left mainstem bronchus (image 56; series 4). There is soft tissue density at the level of the left mainstem bronchus narrowing (image 57; series 2). Patchy ground-glass and consolidative opacities demonstrated within the right middle and right lower lobes. Persistent probable area of rounded atelectasis within the left lower lobe (image 88; series 4). Bandlike consolidation within the medial left upper lobe (image 41; series 4). Patchy opacities within the left lower lobe. No large pleural effusion or pneumothorax. Findings most compatible with prior left hemidiaphragm repair. Upper Abdomen: No acute process. Musculoskeletal: Thoracic spine degenerative changes. IMPRESSION: 1. Narrowing of the left mainstem bronchus with possible soft tissue at the level of narrowing. While findings may be secondary to mucous plugging, intraluminal lesion is not excluded. Consider short-term follow-up chest CT if bronchoscopy is not performed. 2. Patchy ground-glass and consolidative opacities within the right middle and right lower lobes which may represent atelectasis or infection. 3. Bandlike consolidation within the medial left upper lobe which may represent atelectasis. Recommend attention on follow-up. 4. Left upper extremity PICC line tip terminates in the central left brachiocephalic vein. 5. Aortic Atherosclerosis (ICD10-I70.0). Electronically Signed   By: Lovey Newcomer M.D.   On: 04/18/2019 13:31   CT Cervical Spine Wo Contrast  Result Date: 04/14/2019 CLINICAL DATA:  86 year old female status post syncope and fall. EXAM: CT CERVICAL SPINE WITHOUT CONTRAST TECHNIQUE: Multidetector CT imaging of the cervical spine was performed without intravenous contrast. Multiplanar CT image  reconstructions were also generated. COMPARISON:  Head CT today reported separately. Chest CT 08/08/2017. FINDINGS: Alignment: Mild reversal of cervical lordosis. Subtle anterolisthesis at C3-C4 and C4-C5 appears to be degenerative in nature with associated facet arthropathy. Cervicothoracic junction alignment is within normal limits. Bilateral posterior element alignment is within normal limits. Skull base and vertebrae: Visualized skull base is intact. No atlanto-occipital dissociation. No acute osseous abnormality identified. Mild spina bifida occulta at C6 (normal variant). Soft tissues and spinal canal: No prevertebral fluid or swelling. No visible canal hematoma. Negative noncontrast neck soft tissues, partially retropharyngeal carotids. Disc levels: Widespread bilateral cervical facet arthropathy. Comparatively mild chronic disc and endplate degeneration. No significant spinal stenosis suspected. Upper chest: Visible upper thoracic levels appear intact. There is confluent new lung opacity in the left apex on series 4, image 72 when compared to the 2019 chest CT. This does resemble atelectasis. Negative visible superior mediastinum. IMPRESSION: 1. No acute traumatic injury identified in the cervical spine. 2. New nonspecific left apical lung opacity (series 4, image 72) when compared to a 2019 Chest CT. Query cough. Consider a repeat Chest CT (noncontrast may suffice) in 2-3 months to re-evaluate. Electronically Signed   By: Genevie Ann M.D.   On:  21:48   DG CHEST PORT 1 VIEW  Result Date: 04/17/2019 CLINICAL DATA:  Shortness of breath. EXAM: PORTABLE CHEST 1 VIEW COMPARISON:  04/09/2019. FINDINGS: Mediastinum and hilar structures are stable. Cardiomegaly. Pulmonary vascular congestion. Prominent left base atelectasis and consolidation. This could be from bronchial obstruction as the left mainstem bronchus appears to be truncated. Prominent left pleural  effusion. No pneumothorax. No acute bony  abnormality. Surgical clips upper abdomen. IMPRESSION: 1. Left mainstem bronchus truncation with prominent left base atelectasis and consolidation. Moderate left pleural effusion. 2.  Cardiomegaly with pulmonary venous congestion. Electronically Signed   By: Marcello Moores  Register   On: 04/17/2019 08:38   DG Chest Portable 1 View  Result Date: 03/26/2019 CLINICAL DATA:  Shortness of breath, left ankle fracture EXAM: PORTABLE CHEST 1 VIEW COMPARISON:  10/28/2018 FINDINGS: Single frontal view of the chest demonstrates persistent enlargement of the cardiac silhouette. There is chronic central vascular congestion, with chronic left basilar consolidation unchanged. Small bilateral pleural effusions versus pleural thickening, left greater than right, again noted. No pneumothorax. No acute bony abnormality. IMPRESSION: 1. Chronic vascular congestion. 2. Chronic left basilar consolidation compatible with atelectasis or scarring. 3. Stable small bilateral pleural effusions versus pleural thickening. Electronically Signed   By: Randa Ngo M.D.   On: 03/24/2019 23:12   ECHOCARDIOGRAM COMPLETE  Result Date: 04/17/2019    ECHOCARDIOGRAM REPORT   Patient Name:   MELESSA COWELL Date of Exam: 04/17/2019 Medical Rec #:  [id number]       Height:       61.0 in Accession #:    [id number]      Weight:       221.8 lb Date of Birth:  [birth year]       BSA:          1.974 m Patient Age:    21 years        BP:           133/66 mmHg Patient Gender: F               HR:           114 bpm. Exam Location:  [facility] Procedure: 2D Echo Indications:    CHF-Acute Diastolic 412.87 / O67.67  History:        Patient has prior history of Echocardiogram examinations, most                 recent 03/08/2017. CHF, COPD, Arrythmias:Atrial Fibrillation,                 Signs/Symptoms:Chest Pain; Risk Factors:Diabetes. Pulmonary                 hypertension , Chronic respiratory failure.  Sonographer:    Leavy Cella RDCS (AE) Referring Phys: [phone]  DAVID TAT IMPRESSIONS  1. Left ventricular ejection fraction, by estimation, is 55 to 60%. The left ventricle has normal function. The left ventricle has no regional wall motion abnormalities. Left ventricular diastolic parameters are indeterminate.  2. There is flattening of the ventricular septum in systole consistent with RV pressure overload. . Right ventricular systolic function is mildly reduced. The right ventricular size is moderately enlarged. There is mildly elevated pulmonary artery systolic pressure.  3. Left atrial size was mild to moderately dilated.  4. Right atrial size was mildly dilated.  5. The mitral valve is normal in structure. No evidence of mitral valve regurgitation. No evidence of mitral stenosis.  6. The aortic valve is tricuspid. Aortic valve regurgitation is not visualized. No aortic stenosis is present.  7. Indeterminant PASP, inadequate TR jet. FINDINGS  Left Ventricle: Left ventricular ejection fraction, by estimation, is 55 to 60%. The left ventricle has normal function. The left ventricle has no regional wall motion abnormalities. The left ventricular internal cavity size was normal in size. There is  no left ventricular  hypertrophy. Left ventricular diastolic parameters are indeterminate. Right Ventricle: There is flattening of the ventricular septum in systole consistent with RV pressure overload. The right ventricular size is moderately enlarged. Right vetricular wall thickness was not assessed. Right ventricular systolic function is mildly reduced. There is mildly elevated pulmonary artery systolic pressure. The tricuspid regurgitant velocity is 2.35 m/s, and with an assumed right atrial pressure of 10 mmHg, the estimated right ventricular systolic pressure is 73.4 mmHg. Left Atrium: Left atrial size was mild to moderately dilated. Right Atrium: Right atrial size was mildly dilated. Pericardium: There is no evidence of pericardial effusion. Mitral Valve: The mitral valve is  normal in structure. No evidence of mitral valve regurgitation. No evidence of mitral valve stenosis. Tricuspid Valve: The tricuspid valve is not well visualized. Tricuspid valve regurgitation is trivial. No evidence of tricuspid stenosis. Aortic Valve: The aortic valve is tricuspid. Aortic valve regurgitation is not visualized. No aortic stenosis is present. Aortic valve mean gradient measures 4.7 mmHg. Aortic valve peak gradient measures 10.5 mmHg. Aortic valve area, by VTI measures 2.37  cm. Pulmonic Valve: The pulmonic valve was not well visualized. Pulmonic valve regurgitation is not visualized. No evidence of pulmonic stenosis. Aorta: The aortic root is normal in size and structure. Pulmonary Artery: Indeterminant PASP, inadequate TR jet. Venous: The inferior vena cava was not well visualized. IAS/Shunts: The interatrial septum was not well visualized.  LEFT VENTRICLE PLAX 2D LVIDd:         4.78 cm  Diastology LVIDs:         3.63 cm  LV e' lateral:   10.00 cm/s LV PW:         0.94 cm  LV E/e' lateral: 8.3 LV IVS:        0.75 cm  LV e' medial:    5.66 cm/s LVOT diam:     1.90 cm  LV E/e' medial:  14.6 LV SV:         62 LV SV Index:   32 LVOT Area:     2.84 cm  RIGHT VENTRICLE RV S prime:     10.40 cm/s TAPSE (M-mode): 1.3 cm LEFT ATRIUM             Index LA diam:        3.70 cm 1.87 cm/m LA Vol (A2C):   80.9 ml 40.99 ml/m LA Vol (A4C):   56.4 ml 28.58 ml/m LA Biplane Vol: 67.9 ml 34.40 ml/m  AORTIC VALVE AV Area (Vmax):    1.67 cm AV Area (Vmean):   1.79 cm AV Area (VTI):     2.37 cm AV Vmax:           161.68 cm/s AV Vmean:          99.684 cm/s AV VTI:            0.263 m AV Peak Grad:      10.5 mmHg AV Mean Grad:      4.7 mmHg LVOT Vmax:         94.98 cm/s LVOT Vmean:        63.023 cm/s LVOT VTI:          0.220 m LVOT/AV VTI ratio: 0.84  AORTA Ao Root diam: 2.50 cm MITRAL VALVE               TRICUSPID VALVE MV Area (PHT): 5.02 cm    TR Peak grad:   22.1 mmHg MV Decel Time: 151 msec    TR  Vmax:         235.00 cm/s MV E velocity: 82.70 cm/s MV A velocity: 81.00 cm/s  SHUNTS MV E/A ratio:  1.02        Systemic VTI:  0.22 m                            Systemic Diam: 1.90 cm Carlyle Dolly MD Electronically signed by Carlyle Dolly MD Signature Date/Time: 04/17/2019/11:23:36 AM    Final    [place] EKG SITE RITE  Result Date: 04/17/2019 If Site Rite image not attached, placement could not be confirmed due to current cardiac rhythm.   EKG: rapid afib non specific ST changes    ASSESSMENT AND PLAN:   1. PAF:  She has converted to NSR on telemetry Continue eliquis She is encephalopathic with aspiration risk so continue iv cardizem for now   2. Respiratory Failure:  Avoid contraction alkalosis and over diuresis Has been seen by Dr Melvyn Novas DNR Agree with hospice and palliative care consult Husband agrees   3. Fracture : left ankle in splint on eliquis so DVT risk low She was bed bound essentially prior to fall   Cardiology will sign off Nothing to add to her care  Signed: Jenkins Rouge 04/20/2019, 7:57 AM

## 2019-04-20 NOTE — Progress Notes (Signed)
Pt family was concerned if the patient could try and eat as it has been a few days since she had anything. When asking the patient if she was hungry she softly replied, "yes." Asked patient to stick out her tongue and swallow and she was able to perform both tasks. First a sponge soaked with soda was tried, then RN advanced to thickened liquids and sherbet in very small portions. Pt tolerated fair. MD was at bedside and agreed with comfort feeds if patient can tolerate it. Will continue to monitor and offer comfort foods as needed.

## 2019-04-20 NOTE — Progress Notes (Signed)
Pt is very agitated with the blood pressure cuff, and the family wishes to keep it off if possible. RN instructed that we could keep it off and just check every 2 hours, then remove it once the reading is taken. MD made aware. Will continue to monitor.

## 2019-04-20 NOTE — Progress Notes (Signed)
PROGRESS NOTE  Becky Newton HXT:[id number] DOB: 1933-09-16 DOA: 03/25/2019 PCP: [name], NP  Brief History: 86 year old female with a history of COPD, chronic respiratory failure on 6 L, paroxysmal atrial fibrillation, diastolic CHF, OSA, diabetes mellitus type 2 presenting with a mechanical fall and left ankle fracture. The patient is unable to provide history secondary to her encephalopathy. All the history is obtained from review of the medical record. According to the patient's spouse, the patient went to urgent care in the early morning of 03/31/2019 for right hip/leg pain. The patient was prescribed oxycodone and Robaxin. He subsequently drove her home and the patient took her opioids. Around 2 PM, the patient woke up confused and somewhat lethargic. Her spouse waited for period of time until the patient was more alert. He helped her transfer to a walker, but she sustained a mechanical fall during the transfer resulting in left leg pain. She cannot get up. EMS was activated. In the emergency department, the patient was afebrile hemodynamically stable initially on 6 L. She was confused. X-ray showed a bimalleolar fracture of her left ankle. Orthopedics, Dr. Doran Durand was consulted. He recommended transfer to [facility] as well as reduction and splint placement. Patient became increasingly hypoxic and was placed on BiPAP initially. She was given morphine in preparation for her reduction and splint placement. Subsequently, the patient became somnolent. Repeat ABG showed 7.035/>120/227 on 100%. Chest x-ray showed small bilateral effusions with chronic left basilar consolidation. Because of the patient's respiratory failure and requirement for BiPAP, she was not stable for transfer to [facility]. I contacted orthopedics, Dr. Santiago Glad again. He stated that the patient can be maintained at [facility] now and remain in her splint with nonweightbearing. She can  follow up with him in the office in 1 to 2 weeks after discharge. Since admission, the patient became more alert, but remained confused and intermittently agitated. Attempts made to wean off BiPAP, but patient desaturates quickly. Pulmonary medicine was consulted to assist.  BiPAP was ultimately discontinued altogether per family request, and patient was maintained on humidified HFNC/NRB  Assessment/Plan: Acute on chronic respiratory failure with hypoxia and hypercarbia -multifactorial including COPD, fluid overload, OSA/OHS, pulmonary HTN, diaphragmatic paralysis and hypoventilation from opioids -Normally maintained on 6 L nasal cannula at home -Remainson NRB -poor tolerance to humidified HFNC -pulmonary consult appreciated -continue xopenex/atrovent -continue pulmicort  Aspiration pneumonitis -continue zosyn  Bimalleolar fracture left ankle -I discussed case with orthopedics, Dr. Lawson Radar made him aware of the patient's instability for transfer down to [facility] -After discussion with Dr. Johnston Ebbs [facility], nonweightbearing   COPDexacerbation -xopenex/atrovent -continue pulmicort -continue IV solumedrol  Paroxysmal atrial fibrillationwith RVR -spontaneously converted to sinus -continue diltiazem drip>>d/c -restartapixaban -continueIV lopressor  Acute onChronic diastolic CHF/Cor Pulmonale -daily weights -04/18/19 Echo--55-60%, no WMA, flatten ventricular septum c/w RV overload -received lasix IV x 2 -labs suggest intravascular depletion, but still on hypervolemic site from third spacing  Acute on chronic renal failure--CKD 3a -baseline creatinine 0.8-1.1 -serum creatinine peak 1.48  Hyperkalemia -Kayexalate-bicarb-D50/IV insulin-calcium gluconatewere given -improved  Acute metabolic encephalopathy -Secondary to opioids as well as infectious process and hypercarbia -UA neg for pyuria -d/c morhpine -increase IV  haldol--pt having more agitation/confusion rather than pain -ativan prn agitation -add seroquel at bedtime  Diabetes mellitus type 2 -NovoLog sliding scale -Hemoglobin A1c--6.3  Iron deficiency anemia -04/01/2019 iron saturation 5%, ferritin 14 -nulecit 250 mg x 1  Morbid Obesity -BMI  40.43 -lifestyle modification  OSA/OHS -noncompliant with CPAPat home  Goals of Care -Advance care planning, including the explanation and discussion of advance directives was carried out with the patient and family.  Code status including explanations of "Full Code" and "DNR" and alternatives were discussed in detail.  Discussion of end-of-life issues including but not limited palliative care, hospice care and the concept of hospice, other end-of-life care options, power of attorney for health care decisions, living wills, and physician orders for life-sustaining treatment were also discussed with the patient and family.  Total face to face time 16 minutes. -DNR  -plan home with hospice       Disposition Plan: Patient From: Home D/C Place: Home with Hospice Barriers: Hospice DME; oxygen set up if able to get 10L concentrator  Family Communication:Sonand spouseupdated 3/29  Consultants:pulmonary  Code Status:DNR  DVT Prophylaxis:apixaban   Procedures: As Listed in Progress Note Above  Antibiotics: Zosyn 3/25>>>3/26;  3/27>>> azithro 3/25>>>3/29   Subjective: Pt is more somnolent but awakens and answers simple yes/no intermittently.  Intermittent agitation.  No vomiting, diarrhea, resp distress  Objective: Vitals:   04/20/19 0900 04/20/19 0941 04/20/19 1000 04/20/19 1004  BP: (!) 138/102  136/63   Pulse: (!) 104 (!) 108 100 (!) 32  Resp: (!) 25 (!) 33 (!) 31 (!) 33  Temp: 99.1 F (37.3 C) 99 F (37.2 C) 99 F (37.2 C) 99 F (37.2 C)  TempSrc:      SpO2: 93% 92% 93% 96%  Weight:      Height:        Intake/Output Summary (Last 24 hours) at  04/20/2019 1019 Last data filed at 04/20/2019 0700 Gross per 24 hour  Intake 347.63 ml  Output 450 ml  Net -102.37 ml   Weight change: 0.8 kg Exam:   General:  Pt is alert, intermittently follows commands appropriately, not in acute distress  HEENT: No icterus, No thrush, No neck mass, [place]/AT  Cardiovascular: IRRR, S1/S2, no rubs, no gallops  Respiratory: bibasilar rales. No wheeze  Abdomen: Soft/+BS, non tender, non distended, no guarding  Extremities: No edema, No lymphangitis, No petechiae, No rashes, no synovitis   Data Reviewed: I have personally reviewed following labs and imaging studies Basic Metabolic Panel: Recent Labs  Lab 04/16/19 1527 04/17/19 0434 04/18/19 0545 04/19/19 0452 04/20/19 0440  NA 144 146* 148* 148* 153*  K 5.4* 4.9 4.4 4.0 4.4  CL 100 101 101 101 105  CO2 35* 33* 35* 33* 36*  GLUCOSE 191* 166* 187* 166* 207*  BUN 36* 40* 52* 42* 49*  CREATININE 1.51* 1.40* 1.31* 1.02* 1.23*  CALCIUM 9.5 9.3 9.3 9.3 9.6  MG  --  1.9  --  2.2  --    Liver Function Tests: Recent Labs  Lab 03/24/2019 1853 04/17/19 0434 04/18/19 0545 04/19/19 0452 04/20/19 0440  AST 12* _0 ALT _1 ALKPHOS 91 92 78 74 73  BILITOT 0.4 0.8 0.8 0.9 1.0  PROT 6.5 6.1* 5.8* 6.0* 6.2*  ALBUMIN 3.2* 3.0* 3.0* 3.0* 3.1*   No results for input(s): LIPASE, AMYLASE in the last 168 hours. No results for input(s): AMMONIA in the last 168 hours. Coagulation Profile: No results for input(s): INR, PROTIME in the last 168 hours. CBC: Recent Labs  Lab 04/11/2019 1853 04/16/19 0313 04/17/19 0434 04/17/19 0712 04/18/19 0545 04/19/19 0452 04/20/19 0440  WBC 13.6*   < > 13.4* 14.9* 13.4* 11.6* 12.8*  NEUTROABS 11.3*  --   --  12.6*  --  9.7* 11.0*  HGB 9.6*   < > 8.1* 8.2* 7.4* 7.9* 8.4*  HCT 36.3   < > 29.8* 29.2* 25.7* 28.0* 31.2*  MCV 91.7   < > 88.2 87.4 85.4 87.8 91.0  PLT 279   < > 215 248 240 214 256   < > = values in this interval not displayed.    Cardiac Enzymes: No results for input(s): CKTOTAL, CKMB, CKMBINDEX, TROPONINI in the last 168 hours. BNP: Invalid input(s): POCBNP CBG: Recent Labs  Lab 04/19/19 0520 04/19/19 1203 04/19/19 1716 04/20/19 0008 04/20/19 0629  GLUCAP 169* 175* 158* 178* 184*   HbA1C: No results for input(s): HGBA1C in the last 72 hours. Urine analysis:    Component Value Date/Time   COLORURINE YELLOW 04/16/2019 1224   APPEARANCEUR CLEAR 04/16/2019 1224   LABSPEC 1.021 04/16/2019 1224   PHURINE 5.0 04/16/2019 1224   GLUCOSEU NEGATIVE 04/16/2019 1224   HGBUR NEGATIVE 04/16/2019 1224   BILIRUBINUR NEGATIVE 04/16/2019 1224   KETONESUR NEGATIVE 04/16/2019 1224   PROTEINUR NEGATIVE 04/16/2019 1224   NITRITE NEGATIVE 04/16/2019 1224   LEUKOCYTESUR NEGATIVE 04/16/2019 1224   Sepsis Labs: _0 (procalcitonin:4,lacticidven:4) ) Recent Results (from the past 240 hour(s))  SARS CORONAVIRUS 2 (Madiha Bambrick 6-24 HRS) Nasopharyngeal Nasopharyngeal Swab     Status: None   Collection Time: 04/14/2019  7:54 PM   Specimen: Nasopharyngeal Swab  Result Value Ref Range Status   SARS Coronavirus 2 NEGATIVE NEGATIVE Final    Comment: (NOTE) SARS-CoV-2 target nucleic acids are NOT DETECTED. The SARS-CoV-2 RNA is generally detectable in upper and lower respiratory specimens during the acute phase of infection. Negative results do not preclude SARS-CoV-2 infection, do not rule out co-infections with other pathogens, and should not be used as the sole basis for treatment or other patient management decisions. Negative results must be combined with clinical observations, patient history, and epidemiological information. The expected result is Negative. Fact Sheet for Patients: SugarRoll.be Fact Sheet for Healthcare Providers: https://www.woods-mathews.com/ This test is not yet approved or cleared by the [place] FDA and  has been authorized for detection and/or  diagnosis of SARS-CoV-2 by FDA under an Emergency Use Authorization (EUA). This EUA will remain  in effect (meaning this test can be used) for the duration of the COVID-19 declaration under Section 56 4(b)(1) of the Act, 21 U.S.C. section 360bbb-3(b)(1), unless the authorization is terminated or revoked sooner. Performed at [facility], [place] [address], [place], [place] [address]   Respiratory Panel by RT PCR (Flu A&B, Covid) - Nasopharyngeal Swab     Status: None   Collection Time: 04/11/2019 10:36 PM   Specimen: Nasopharyngeal Swab  Result Value Ref Range Status   SARS Coronavirus 2 by RT PCR NEGATIVE NEGATIVE Final    Comment: (NOTE) SARS-CoV-2 target nucleic acids are NOT DETECTED. The SARS-CoV-2 RNA is generally detectable in upper respiratoy specimens during the acute phase of infection. The lowest concentration of SARS-CoV-2 viral copies this assay can detect is 131 copies/mL. A negative result does not preclude SARS-Cov-2 infection and should not be used as the sole basis for treatment or other patient management decisions. A negative result may occur with  improper specimen collection/handling, submission of specimen other than nasopharyngeal swab, presence of viral mutation(s) within the areas targeted by this assay, and inadequate number of viral copies (<131 copies/mL). A negative result must be combined with clinical observations, patient history, and epidemiological information. The expected result is Negative. Fact Sheet for Patients:  PinkCheek.be Fact Sheet for Healthcare Providers:  GravelBags.it This test is not yet ap proved or cleared by the [place] FDA and  has been authorized for detection and/or diagnosis of SARS-CoV-2 by FDA under an Emergency Use Authorization (EUA). This EUA will remain  in effect (meaning this test can be used) for the duration of the COVID-19 declaration under Section  564(b)(1) of the Act, 21 U.S.C. section 360bbb-3(b)(1), unless the authorization is terminated or revoked sooner.    Influenza A by PCR NEGATIVE NEGATIVE Final   Influenza B by PCR NEGATIVE NEGATIVE Final    Comment: (NOTE) The Xpert Xpress SARS-CoV-2/FLU/RSV assay is intended as an aid in  the diagnosis of influenza from Nasopharyngeal swab specimens and  should not be used as a sole basis for treatment. Nasal washings and  aspirates are unacceptable for Xpert Xpress SARS-CoV-2/FLU/RSV  testing. Fact Sheet for Patients: PinkCheek.be Fact Sheet for Healthcare Providers: GravelBags.it This test is not yet approved or cleared by the [place] FDA and  has been authorized for detection and/or diagnosis of SARS-CoV-2 by  FDA under an Emergency Use Authorization (EUA). This EUA will remain  in effect (meaning this test can be used) for the duration of the  Covid-19 declaration under Section 564(b)(1) of the Act, 21  U.S.C. section 360bbb-3(b)(1), unless the authorization is  terminated or revoked. Performed at [facility], [address], [place], [place] [address]   Culture, blood (Routine X 2) w Reflex to ID Panel     Status: None (Preliminary result)   Collection Time: 04/16/19  9:49 AM   Specimen: BLOOD LEFT HAND  Result Value Ref Range Status   Specimen Description BLOOD LEFT HAND  Final   Special Requests   Final    BOTTLES DRAWN AEROBIC AND ANAEROBIC Blood Culture results may not be optimal due to an inadequate volume of blood received in culture bottles   Culture   Final    NO GROWTH 4 DAYS Performed at [facility], [address] [place], [place] [address]    Report Status PENDING  Incomplete  MRSA PCR Screening     Status: None   Collection Time: 04/16/19 10:34 AM   Specimen: Nasopharyngeal  Result Value Ref Range Status   MRSA by PCR NEGATIVE NEGATIVE Final    Comment:        The GeneXpert MRSA Assay  (FDA approved for NASAL specimens only), is one component of a comprehensive MRSA colonization surveillance program. It is not intended to diagnose MRSA infection nor to guide or monitor treatment for MRSA infections. Performed at [facility], [address] [place], [place] [address]   Culture, Urine     Status: None   Collection Time: 04/16/19 12:24 PM   Specimen: Urine, Random  Result Value Ref Range Status   Specimen Description   Final    URINE, RANDOM Performed at [facility], [address], [place], [place] [address]    Special Requests   Final    NONE Performed at [facility], [address], [place], [place] [address]    Culture   Final    NO GROWTH Performed at [facility], [place] [address] [place],  [address]    Report Status 04/17/2019 FINAL  Final     Scheduled Meds: . apixaban  5 mg Oral BID  . arformoterol  15 mcg Nebulization BID  . budesonide (PULMICORT) nebulizer solution  0.5 mg Nebulization BID  . chlorhexidine  15 mL Mouth  Rinse BID  . Chlorhexidine Gluconate Cloth  6 each Topical Daily  . insulin aspart  0-9 Units Subcutaneous Q6H  . ipratropium  0.5 mg Nebulization Q6H  . latanoprost  1 drop Both Eyes QHS  . levalbuterol  0.63 mg Nebulization Q6H  . mouth rinse  15 mL Mouth Rinse q12n4p  . methylPREDNISolone (SOLU-MEDROL) injection  40 mg Intravenous Q12H  . metoprolol tartrate  5 mg Intravenous Q6H  . nystatin   Topical BID  . pantoprazole (PROTONIX) IV  40 mg Intravenous Q24H  . QUEtiapine  25 mg Oral QHS  . sodium chloride flush  10-40 mL Intracatheter Q12H  . sodium chloride flush  3 mL Intravenous Q12H   Continuous Infusions: . sodium chloride    . azithromycin 500 mg (04/20/19 0757)  . diltiazem (CARDIZEM) infusion Stopped (04/19/19 0535)  . piperacillin-tazobactam (ZOSYN)  IV 12.5 mL/hr at 04/20/19 0700    Procedures/Studies: DG Ankle 2 Views Left  Result Date: 03/25/2019 CLINICAL DATA:  Bimalleolar ankle  fracture, reduction EXAM: LEFT ANKLE - 2 VIEW COMPARISON:  03/29/2019 FINDINGS: Frontal and lateral views of the left ankle demonstrate reduction of the bimalleolar ankle fracture seen previously, with near anatomic alignment of the medial and lateral malleoli. The talus is now aligned anatomic Lea with the tibial plafond. Diffuse soft tissue edema. IMPRESSION: 1. Near anatomic alignment of the bimalleolar fracture seen previously. 2. Anatomic alignment of the tibiotalar joint. Electronically Signed   By: Randa Ngo M.D.   On: 04/10/2019 23:11   DG Ankle Complete Left  Result Date: 03/30/2019 CLINICAL DATA:  [place], pain and bruising, visible deformity EXAM: LEFT ANKLE COMPLETE - 3+ VIEW COMPARISON:  None. FINDINGS: Frontal and cross-table lateral views of the left ankle are obtained. Mildly comminuted oblique fracture of the distal fibular diaphysis is noted. There is a comminuted transverse fracture of the medial malleolus. There is lateral displacement of the medial and lateral malleolar fracture fragments, with severe lateral subluxation of the talus relative to the tibial plafond. There is diffuse soft tissue edema. IMPRESSION: 1. Bimalleolar fracture with severe lateral subluxation of the talus. Electronically Signed   By: Randa Ngo M.D.   On: 03/24/2019 18:54   CT Head Wo Contrast  Result Date: 04/14/2019 CLINICAL DATA:  86 year old female status post syncope and fall. EXAM: CT HEAD WITHOUT CONTRAST TECHNIQUE: Contiguous axial images were obtained from the base of the skull through the vertex without intravenous contrast. COMPARISON:  None. FINDINGS: Brain: Cerebral volume is within normal limits for age. No midline shift, ventriculomegaly, mass effect, evidence of mass lesion, intracranial hemorrhage or evidence of cortically based acute infarction. Possible small focus of subependymal gray matter heterotopia at the left lateral ventricle on series 2, image 15. Elsewhere gray-white matter  differentiation appears normal for age. Vascular: Calcified atherosclerosis at the skull base. No suspicious intracranial vascular hyperdensity. Skull: Negative, no skull fracture identified. Sinuses/Orbits: Bilateral mastoid effusions and middle ear opacification, appears to be inflammatory. Negative visible nasopharynx. Visible paranasal sinuses are clear. Other: No acute orbit or scalp soft tissue injury identified. IMPRESSION: 1. No acute intracranial abnormality or acute traumatic injury identified. 2. Bilateral middle ear and mastoid opacification appears to be inflammatory. Consider otitis media or cholesteatoma. ENT follow-up may be valuable. Electronically Signed   By: Genevie Ann M.D.   On: 04/14/2019 21:42   CT CHEST WO CONTRAST  Result Date: 04/18/2019 CLINICAL DATA:  Evaluate for pneumonia. EXAM: CT CHEST WITHOUT CONTRAST TECHNIQUE: Multidetector CT imaging of the  chest was performed following the standard protocol without IV contrast. COMPARISON:  Chest radiograph 04/17/2019; chest CT 08/08/2017 FINDINGS: Cardiovascular: Heart is enlarged. Thoracic aortic calcifications. Left upper extremity PICC line tip terminates in the central left brachiocephalic vein. Mediastinum/Nodes: No enlarged axillary, mediastinal or hilar lymphadenopathy. Postsurgical changes involving the distal esophagus/proximal stomach. Lungs/Pleura: Narrowing of the left mainstem bronchus (image 56; series 4). There is soft tissue density at the level of the left mainstem bronchus narrowing (image 57; series 2). Patchy ground-glass and consolidative opacities demonstrated within the right middle and right lower lobes. Persistent probable area of rounded atelectasis within the left lower lobe (image 88; series 4). Bandlike consolidation within the medial left upper lobe (image 41; series 4). Patchy opacities within the left lower lobe. No large pleural effusion or pneumothorax. Findings most compatible with prior left hemidiaphragm  repair. Upper Abdomen: No acute process. Musculoskeletal: Thoracic spine degenerative changes. IMPRESSION: 1. Narrowing of the left mainstem bronchus with possible soft tissue at the level of narrowing. While findings may be secondary to mucous plugging, intraluminal lesion is not excluded. Consider short-term follow-up chest CT if bronchoscopy is not performed. 2. Patchy ground-glass and consolidative opacities within the right middle and right lower lobes which may represent atelectasis or infection. 3. Bandlike consolidation within the medial left upper lobe which may represent atelectasis. Recommend attention on follow-up. 4. Left upper extremity PICC line tip terminates in the central left brachiocephalic vein. 5. Aortic Atherosclerosis (ICD10-I70.0). Electronically Signed   By: Lovey Newcomer M.D.   On: 04/18/2019 13:31   CT Cervical Spine Wo Contrast  Result Date: 04/01/2019 CLINICAL DATA:  86 year old female status post syncope and fall. EXAM: CT CERVICAL SPINE WITHOUT CONTRAST TECHNIQUE: Multidetector CT imaging of the cervical spine was performed without intravenous contrast. Multiplanar CT image reconstructions were also generated. COMPARISON:  Head CT today reported separately. Chest CT 08/08/2017. FINDINGS: Alignment: Mild reversal of cervical lordosis. Subtle anterolisthesis at C3-C4 and C4-C5 appears to be degenerative in nature with associated facet arthropathy. Cervicothoracic junction alignment is within normal limits. Bilateral posterior element alignment is within normal limits. Skull base and vertebrae: Visualized skull base is intact. No atlanto-occipital dissociation. No acute osseous abnormality identified. Mild spina bifida occulta at C6 (normal variant). Soft tissues and spinal canal: No prevertebral fluid or swelling. No visible canal hematoma. Negative noncontrast neck soft tissues, partially retropharyngeal carotids. Disc levels: Widespread bilateral cervical facet arthropathy.  Comparatively mild chronic disc and endplate degeneration. No significant spinal stenosis suspected. Upper chest: Visible upper thoracic levels appear intact. There is confluent new lung opacity in the left apex on series 4, image 72 when compared to the 2019 chest CT. This does resemble atelectasis. Negative visible superior mediastinum. IMPRESSION: 1. No acute traumatic injury identified in the cervical spine. 2. New nonspecific left apical lung opacity (series 4, image 72) when compared to a 2019 Chest CT. Query cough. Consider a repeat Chest CT (noncontrast may suffice) in 2-3 months to re-evaluate. Electronically Signed   By: Genevie Ann M.D.   On: 04/19/2019 21:48   DG CHEST PORT 1 VIEW  Result Date: 04/17/2019 CLINICAL DATA:  Shortness of breath. EXAM: PORTABLE CHEST 1 VIEW COMPARISON:  04/06/2019. FINDINGS: Mediastinum and hilar structures are stable. Cardiomegaly. Pulmonary vascular congestion. Prominent left base atelectasis and consolidation. This could be from bronchial obstruction as the left mainstem bronchus appears to be truncated. Prominent left pleural effusion. No pneumothorax. No acute bony abnormality. Surgical clips upper abdomen. IMPRESSION: 1. Left mainstem bronchus truncation  with prominent left base atelectasis and consolidation. Moderate left pleural effusion. 2.  Cardiomegaly with pulmonary venous congestion. Electronically Signed   By: Marcello Moores  Register   On: 04/17/2019 08:38   DG Chest Portable 1 View  Result Date: 03/31/2019 CLINICAL DATA:  Shortness of breath, left ankle fracture EXAM: PORTABLE CHEST 1 VIEW COMPARISON:  10/28/2018 FINDINGS: Single frontal view of the chest demonstrates persistent enlargement of the cardiac silhouette. There is chronic central vascular congestion, with chronic left basilar consolidation unchanged. Small bilateral pleural effusions versus pleural thickening, left greater than right, again noted. No pneumothorax. No acute bony abnormality. IMPRESSION:  1. Chronic vascular congestion. 2. Chronic left basilar consolidation compatible with atelectasis or scarring. 3. Stable small bilateral pleural effusions versus pleural thickening. Electronically Signed   By: Randa Ngo M.D.   On: 03/28/2019 23:12   ECHOCARDIOGRAM COMPLETE  Result Date: 04/17/2019    ECHOCARDIOGRAM REPORT   Patient Name:   LUCI BELLUCCI Date of Exam: 04/17/2019 Medical Rec #:  [id number]       Height:       61.0 in Accession #:    [id number]      Weight:       221.8 lb Date of Birth:  1933/03/09       BSA:          1.974 m Patient Age:    27 years        BP:           133/66 mmHg Patient Gender: F               HR:           114 bpm. Exam Location:  [facility] Procedure: 2D Echo Indications:    CHF-Acute Diastolic 326.71 / I45.80  History:        Patient has prior history of Echocardiogram examinations, most                 recent 03/08/2017. CHF, COPD, Arrythmias:Atrial Fibrillation,                 Signs/Symptoms:Chest Pain; Risk Factors:Diabetes. Pulmonary                 hypertension , Chronic respiratory failure.  Sonographer:    Leavy Cella RDCS (AE) Referring Phys: [phone] [place] IMPRESSIONS  1. Left ventricular ejection fraction, by estimation, is 55 to 60%. The left ventricle has normal function. The left ventricle has no regional wall motion abnormalities. Left ventricular diastolic parameters are indeterminate.  2. There is flattening of the ventricular septum in systole consistent with RV pressure overload. . Right ventricular systolic function is mildly reduced. The right ventricular size is moderately enlarged. There is mildly elevated pulmonary artery systolic pressure.  3. Left atrial size was mild to moderately dilated.  4. Right atrial size was mildly dilated.  5. The mitral valve is normal in structure. No evidence of mitral valve regurgitation. No evidence of mitral stenosis.  6. The aortic valve is tricuspid. Aortic valve regurgitation is not visualized. No aortic  stenosis is present.  7. Indeterminant PASP, inadequate TR jet. FINDINGS  Left Ventricle: Left ventricular ejection fraction, by estimation, is 55 to 60%. The left ventricle has normal function. The left ventricle has no regional wall motion abnormalities. The left ventricular internal cavity size was normal in size. There is  no left ventricular hypertrophy. Left ventricular diastolic parameters are indeterminate. Right Ventricle: There is flattening of the ventricular septum in  systole consistent with RV pressure overload. The right ventricular size is moderately enlarged. Right vetricular wall thickness was not assessed. Right ventricular systolic function is mildly reduced. There is mildly elevated pulmonary artery systolic pressure. The tricuspid regurgitant velocity is 2.35 m/s, and with an assumed right atrial pressure of 10 mmHg, the estimated right ventricular systolic pressure is 42.8 mmHg. Left Atrium: Left atrial size was mild to moderately dilated. Right Atrium: Right atrial size was mildly dilated. Pericardium: There is no evidence of pericardial effusion. Mitral Valve: The mitral valve is normal in structure. No evidence of mitral valve regurgitation. No evidence of mitral valve stenosis. Tricuspid Valve: The tricuspid valve is not well visualized. Tricuspid valve regurgitation is trivial. No evidence of tricuspid stenosis. Aortic Valve: The aortic valve is tricuspid. Aortic valve regurgitation is not visualized. No aortic stenosis is present. Aortic valve mean gradient measures 4.7 mmHg. Aortic valve peak gradient measures 10.5 mmHg. Aortic valve area, by VTI measures 2.37  cm. Pulmonic Valve: The pulmonic valve was not well visualized. Pulmonic valve regurgitation is not visualized. No evidence of pulmonic stenosis. Aorta: The aortic root is normal in size and structure. Pulmonary Artery: Indeterminant PASP, inadequate TR jet. Venous: The inferior vena cava was not well visualized. IAS/Shunts: The  interatrial septum was not well visualized.  LEFT VENTRICLE PLAX 2D LVIDd:         4.78 cm  Diastology LVIDs:         3.63 cm  LV e' lateral:   10.00 cm/s LV PW:         0.94 cm  LV E/e' lateral: 8.3 LV IVS:        0.75 cm  LV e' medial:    5.66 cm/s LVOT diam:     1.90 cm  LV E/e' medial:  14.6 LV SV:         62 LV SV Index:   32 LVOT Area:     2.84 cm  RIGHT VENTRICLE RV S prime:     10.40 cm/s TAPSE (M-mode): 1.3 cm LEFT ATRIUM             Index LA diam:        3.70 cm 1.87 cm/m LA Vol (A2C):   80.9 ml 40.99 ml/m LA Vol (A4C):   56.4 ml 28.58 ml/m LA Biplane Vol: 67.9 ml 34.40 ml/m  AORTIC VALVE AV Area (Vmax):    1.67 cm AV Area (Vmean):   1.79 cm AV Area (VTI):     2.37 cm AV Vmax:           161.68 cm/s AV Vmean:          99.684 cm/s AV VTI:            0.263 m AV Peak Grad:      10.5 mmHg AV Mean Grad:      4.7 mmHg LVOT Vmax:         94.98 cm/s LVOT Vmean:        63.023 cm/s LVOT VTI:          0.220 m LVOT/AV VTI ratio: 0.84  AORTA Ao Root diam: 2.50 cm MITRAL VALVE               TRICUSPID VALVE MV Area (PHT): 5.02 cm    TR Peak grad:   22.1 mmHg MV Decel Time: 151 msec    TR Vmax:        235.00 cm/s MV E velocity: 82.70 cm/s MV A  velocity: 81.00 cm/s  SHUNTS MV E/A ratio:  1.02        Systemic VTI:  0.22 m                            Systemic Diam: 1.90 cm Carlyle Dolly MD Electronically signed by Carlyle Dolly MD Signature Date/Time: 04/17/2019/11:23:36 AM    Final    [place] EKG SITE RITE  Result Date: 04/17/2019 If Site Rite image not attached, placement could not be confirmed due to current cardiac rhythm.   Orson Eva, DO  [facility]  If 7PM-7AM, please contact night-coverage [email or web address] Password [email or web address] 04/20/2019, 10:19 AM   LOS: 4 days

## 2019-04-21 LAB — BASIC METABOLIC PANEL
Anion gap: 12 (ref 5–15)
BUN: 60 mg/dL — ABNORMAL HIGH (ref 8–23)
CO2: 35 mmol/L — ABNORMAL HIGH (ref 22–32)
Calcium: 9.7 mg/dL (ref 8.9–10.3)
Chloride: 109 mmol/L (ref 98–111)
Creatinine, Ser: 1.32 mg/dL — ABNORMAL HIGH (ref 0.44–1.00)
GFR calc Af Amer: 43 mL/min — ABNORMAL LOW (ref >60)
GFR calc non Af Amer: 37 mL/min — ABNORMAL LOW (ref >60)
Glucose, Bld: 174 mg/dL — ABNORMAL HIGH (ref 70–99)
Potassium: 4.6 mmol/L (ref 3.5–5.1)
Sodium: 156 mmol/L — ABNORMAL HIGH (ref 135–145)

## 2019-04-21 LAB — CBC
HCT: 33.8 % — ABNORMAL LOW (ref 36.0–46.0)
Hemoglobin: 8.9 g/dL — ABNORMAL LOW (ref 12.0–15.0)
MCH: 24.6 pg — ABNORMAL LOW (ref 26.0–34.0)
MCHC: 26.3 g/dL — ABNORMAL LOW (ref 30.0–36.0)
MCV: 93.4 fL (ref 80.0–100.0)
Platelets: 248 10*3/uL (ref 150–400)
RBC: 3.62 MIL/uL — ABNORMAL LOW (ref 3.87–5.11)
RDW: 23 % — ABNORMAL HIGH (ref 11.5–15.5)
WBC: 13.3 10*3/uL — ABNORMAL HIGH (ref 4.0–10.5)
nRBC: 3.8 % — ABNORMAL HIGH (ref 0.0–0.2)

## 2019-04-21 LAB — CULTURE, BLOOD (ROUTINE X 2): Culture: NO GROWTH

## 2019-04-21 LAB — MAGNESIUM: Magnesium: 2.6 mg/dL — ABNORMAL HIGH (ref 1.7–2.4)

## 2019-04-21 LAB — GLUCOSE, CAPILLARY: Glucose-Capillary: 162 mg/dL — ABNORMAL HIGH (ref 70–99)

## 2019-04-21 MED ORDER — MORPHINE SULFATE (PF) 2 MG/ML IV SOLN
INTRAVENOUS | Status: AC
  Filled 2019-04-21: qty 1

## 2019-04-21 MED ORDER — LORAZEPAM 2 MG/ML IJ SOLN
INTRAMUSCULAR | Status: AC
  Filled 2019-04-21: qty 1

## 2019-04-21 MED ORDER — MORPHINE SULFATE (PF) 2 MG/ML IV SOLN
2.0000 mg | INTRAVENOUS | Status: DC | PRN
  Administered 2019-04-21: 2 mg via INTRAVENOUS

## 2019-04-21 MED ORDER — LORAZEPAM 2 MG/ML IJ SOLN
1.0000 mg | INTRAMUSCULAR | Status: DC | PRN
  Administered 2019-04-21: 2 mg via INTRAVENOUS

## 2019-04-21 MED ORDER — MORPHINE SULFATE (CONCENTRATE) 10 MG/0.5ML PO SOLN
5.0000 mg | ORAL | Status: DC | PRN
  Administered 2019-04-21: 5 mg via SUBLINGUAL
  Filled 2019-04-21: qty 0.5

## 2019-04-22 ENCOUNTER — Telehealth: Payer: Self-pay | Admitting: Critical Care Medicine

## 2019-04-22 NOTE — Telephone Encounter (Signed)
Please ask Tammy and Jackson Latino- they would know better than I do. That is very gracious of him to consider other patients. Please express my condolences. I will try to ask a pharmacist at [facility] to see if they know anything about ways to bring unused and unopened prescriptions back into the pharmacy to be donated or used.  Julian Hy, DO  5:26 PM [facility]

## 2019-04-22 NOTE — Telephone Encounter (Signed)
Pt is deceased.

## 2019-04-22 NOTE — Telephone Encounter (Signed)
Dr. Carlis Abbott, please advise on this for pt's husband if you have any answers for him in regards to what he might be able to do with pt's prescriptions.  Pt passed away 05/06/2022 at [facility].

## 2019-04-23 NOTE — Progress Notes (Signed)
Pt family in agreement that they didn't want patient suffering anymore, so they requested the oxygen be taken off. Also requested something to help with labored breathing. Medication given and about 5 minutes later oxygen was removed. Pt granddaughter who was at bedside came out to nursing staff to let us know that she felt she was gone. Time of death 69, verified by Lillie Fragmin, RN and Sherry Ruffing, RN. MD made aware.

## 2019-04-23 NOTE — Progress Notes (Signed)
Palliative:  I was called back to bedside by husband as he believes she is suffering and wants her comfortable and he is ready to d/c oxygen and allow her a peaceful death. He worries that we are prolonging her suffering but is also concerned about her feeling scared. Chaplain Mardene Celeste is also present during conversation. We provided presence and reassurance to husband that this is a very appropriate decision. He voices concern that when she dies he will be ready to go himself. He also expresses regret that he allowed her to fall which we reassured him that this is not his fault but her journey. Encouraged him to lean on his faith and his family that God will continue to care for her and will continue to care for him as well.   I also spoke with granddaughter regarding concerns for Bob's mental and emotional health after losing the love of his life. I encouraged surrounding him by people that love and uplift him. Encouraged family to be present with him and to reach out to his pastor and church for counseling and support. Also encouraged bereavement counseling with hospice and reaching out to any of his trusted providers to let them know.   Worked with RN to provide IV morphine and ativan and to remove oxygen. She appears comfortable and likely to pass in next minutes to hours. I do not expect her to survive the evening.   I spent an additional 25 min with family (total 50 min today).   Vinie Sill, NP Palliative Medicine Team Pager [phone] (Please see [email or web address] for schedule) Team Phone [phone]    Greater than 50%  of this time was spent counseling and coordinating care related to the above assessment and plan

## 2019-04-23 NOTE — Telephone Encounter (Signed)
Called and spoke with pt's husband Herbie Baltimore stating that he has about 3 unopened boxes of dulera inhaler that he was going to bring by the office to give [place] and so we can see if we can give them to a pt in need. I told pt to ask for me when he brings the inhalers by and I will come out and get them from him.  Once I have the inhalers, I will check with Jules/Tammy or pharmacy team to see if any of them have recommendations to what we can do with the inhalers especially if they are unopened. Maybe we can hold them and give them to a pt that is in need of it.

## 2019-04-23 NOTE — Progress Notes (Signed)
Nutrition Brief Note  Nutrition department drawn to patient due to NPO diet order x 5 days.   Chart reviewed. Pt now transitioning to comfort care, family hopeful of transferring patient to hospice facility today.  No nutrition interventions warranted at this time. If nutrition issues arise, please consult RD.   Lajuan Lines, RD, LDN Clinical Nutrition After Hours/Weekend Pager # in [employer]

## 2019-04-23 NOTE — Death Summary Note (Signed)
DEATH SUMMARY   Patient Details  Name: Becky Newton MRN: [id number] DOB: 07-22-1933  Admission/Discharge Information   Admit Date:  2019/04/29  Date of Death: Date of Death: 05-May-2019  Time of Death: Time of Death: Mar 16, 1456  Length of Stay: 5  Referring Physician: [name], NP   Reason(s) for Hospitalization  Ankle fracture; respiratory failure  Diagnoses  Preliminary cause of death: acute on chronic respiratory failure Secondary Diagnoses (including complications and co-morbidities):  Acute on chronic respiratory failure with hypoxia and hypercarbia -multifactorial including COPD, fluid overload, OSA/OHS, pulmonary HTN, diaphragmatic paralysis and hypoventilation from opioids -Normally maintained on 6 L nasal cannula at home -Remainson NRB -poor tolerance to humidified HFNC -pulmonary consult appreciated -continuexopenex/atrovent -continue pulmicort  Aspiration pneumonitis -continue zosyn  Bimalleolar fracture left ankle -I discussed case with orthopedics, Dr. Lawson Radar made him aware of the patient's instability for transfer down to [facility] -After discussion with Dr. Johnston Ebbs tokeep patient atAPHwith splint, nonweightbearing   COPDexacerbation -xopenex/atrovent -continuepulmicort -continue IV solumedrol  Paroxysmal atrial fibrillationwith RVR -spontaneously converted to sinus -continue diltiazem drip>>d/c -restartapixaban-->ultimately discontinued after family meeting with palliative medicine -continueIV lopressor  Acute onChronic diastolic CHF/Cor Pulmonale -daily weights -04/18/19 Echo--55-60%, no WMA, flatten ventricular septum c/w RV overload -received lasix IV x 2 -labs suggest intravascular depletion, but still on hypervolemic site from third spacing  Acute on chronic renal failure--CKD 3a -baseline creatinine 0.8-1.1 -serum creatinine peak 1.48  Hyperkalemia -Kayexalate-bicarb-D50/IV insulin-calcium gluconatewere  given -improved  Acute metabolic encephalopathy -Secondary to opioids as well as infectious process and hypercarbia -UA neg for pyuria -d/c morhpine -increase IV haldol--pt having more agitation/confusion rather than pain -ativan prn agitation -add zyprexa at bedtime  Diabetes mellitus type 2 -NovoLog sliding scale -Hemoglobin A1c--6.3  Iron deficiency anemia -04/01/2019 iron saturation 5%, ferritin 14 -nulecit 250 mg x 1  Morbid Obesity -BMI 40.43 -lifestyle modification  OSA/OHS -noncompliant with CPAPat home  Goals of Care -Advance care planning, including the explanation and discussion of advance directives was carried out with the patient and family. Code status including explanations of "Full Code" and "DNR" and alternatives were discussed in detail. Discussion of end-of-life issues including but not limited palliative care, hospice care and the concept of hospice, other end-of-life care options, power of attorney for health care decisions, living wills, and physician orders for life-sustaining treatment were also discussed with the patient and family. Total face to face time 16 minutes. -DNR  -plan transition to full comfort  Brief Hospital Course (including significant findings, care, treatment, and services provided and events leading to death)   86 year old female with a history of COPD, chronic respiratory failure on 6 L, paroxysmal atrial fibrillation, diastolic CHF, OSA, diabetes mellitus type 2 presenting with a mechanical fall and left ankle fracture. The patient is unable to provide history secondary to her encephalopathy. All the history is obtained from review of the medical record. According to the patient's spouse, the patient went to urgent care in the early morning of April 29, 2019 for right hip/leg pain. The patient was prescribed oxycodone and Robaxin. He subsequently drove her home and the patient took her opioids. Around 2 PM, the patient woke up  confused and somewhat lethargic. Her spouse waited for period of time until the patient was more alert. He helped her transfer to a walker, but she sustained a mechanical fall during the transfer resulting in left leg pain. She cannot get up. EMS was activated. In the emergency department, the patient was afebrile hemodynamically stable initially on 6  L. She was confused. X-ray showed a bimalleolar fracture of her left ankle. Orthopedics, Dr. Doran Durand was consulted. He recommended transfer to [facility] as well as reduction and splint placement. Patient became increasingly hypoxic and was placed on BiPAP initially. She was given morphine in preparation for her reduction and splint placement. Subsequently, the patient became somnolent. Repeat ABG showed 7.035/>120/227 on 100%. Chest x-ray showed small bilateral effusions with chronic left basilar consolidation. Because of the patient's respiratory failure and requirement for BiPAP, she was not stable for transfer to [facility]. I contacted orthopedics, Dr. Santiago Glad again. He stated that the patient can be maintained at [facility] now and remain in her splint with nonweightbearing. She can follow up with him in the office in 1 to 2 weeks after discharge. Since admission, the patient became more alert, but remained confused and intermittently agitated. Attempts made to wean off BiPAP, but patient desaturates quickly. Pulmonary medicine was consulted to assist.BiPAP was ultimately discontinued altogether per family request, and patient was maintained on humidified HFNC/NRB.  The patient maintained stable oxygen saturations on NRB on 10L.  She was started on zosyn for aspiration pneumonitis.  She continued to have intermittent episodes of agitation.  Palliative medicine was consulted.  After family meetings, the spouse and son decided to transition the patient's focus of care on full comfort. Palliative medicine was consulted and had  family meetings confirming family's desire for comfort care.  They assisted with comfort measures and symptom management.  Ultimately, the patient expired on 05-11-2019    Pertinent Labs and Studies  Significant Diagnostic Studies DG Ankle 2 Views Left  Result Date: 04/10/2019 CLINICAL DATA:  Bimalleolar ankle fracture, reduction EXAM: LEFT ANKLE - 2 VIEW COMPARISON:  04/03/2019 FINDINGS: Frontal and lateral views of the left ankle demonstrate reduction of the bimalleolar ankle fracture seen previously, with near anatomic alignment of the medial and lateral malleoli. The talus is now aligned anatomic Lea with the tibial plafond. Diffuse soft tissue edema. IMPRESSION: 1. Near anatomic alignment of the bimalleolar fracture seen previously. 2. Anatomic alignment of the tibiotalar joint. Electronically Signed   By: Randa Ngo M.D.   On: 04/08/2019 23:11   DG Ankle Complete Left  Result Date: 04/20/2019 CLINICAL DATA:  [place], pain and bruising, visible deformity EXAM: LEFT ANKLE COMPLETE - 3+ VIEW COMPARISON:  None. FINDINGS: Frontal and cross-table lateral views of the left ankle are obtained. Mildly comminuted oblique fracture of the distal fibular diaphysis is noted. There is a comminuted transverse fracture of the medial malleolus. There is lateral displacement of the medial and lateral malleolar fracture fragments, with severe lateral subluxation of the talus relative to the tibial plafond. There is diffuse soft tissue edema. IMPRESSION: 1. Bimalleolar fracture with severe lateral subluxation of the talus. Electronically Signed   By: Randa Ngo M.D.   On: 04/02/2019 18:54   CT Head Wo Contrast  Result Date: 04/06/2019 CLINICAL DATA:  86 year old female status post syncope and fall. EXAM: CT HEAD WITHOUT CONTRAST TECHNIQUE: Contiguous axial images were obtained from the base of the skull through the vertex without intravenous contrast. COMPARISON:  None. FINDINGS: Brain: Cerebral volume is within  normal limits for age. No midline shift, ventriculomegaly, mass effect, evidence of mass lesion, intracranial hemorrhage or evidence of cortically based acute infarction. Possible small focus of subependymal gray matter heterotopia at the left lateral ventricle on series 2, image 15. Elsewhere gray-white matter differentiation appears normal for age. Vascular: Calcified atherosclerosis at the skull base. No  suspicious intracranial vascular hyperdensity. Skull: Negative, no skull fracture identified. Sinuses/Orbits: Bilateral mastoid effusions and middle ear opacification, appears to be inflammatory. Negative visible nasopharynx. Visible paranasal sinuses are clear. Other: No acute orbit or scalp soft tissue injury identified. IMPRESSION: 1. No acute intracranial abnormality or acute traumatic injury identified. 2. Bilateral middle ear and mastoid opacification appears to be inflammatory. Consider otitis media or cholesteatoma. ENT follow-up may be valuable. Electronically Signed   By: Genevie Ann M.D.   On: 04/12/2019 21:42   CT CHEST WO CONTRAST  Result Date: 04/18/2019 CLINICAL DATA:  Evaluate for pneumonia. EXAM: CT CHEST WITHOUT CONTRAST TECHNIQUE: Multidetector CT imaging of the chest was performed following the standard protocol without IV contrast. COMPARISON:  Chest radiograph 04/17/2019; chest CT 08/08/2017 FINDINGS: Cardiovascular: Heart is enlarged. Thoracic aortic calcifications. Left upper extremity PICC line tip terminates in the central left brachiocephalic vein. Mediastinum/Nodes: No enlarged axillary, mediastinal or hilar lymphadenopathy. Postsurgical changes involving the distal esophagus/proximal stomach. Lungs/Pleura: Narrowing of the left mainstem bronchus (image 56; series 4). There is soft tissue density at the level of the left mainstem bronchus narrowing (image 57; series 2). Patchy ground-glass and consolidative opacities demonstrated within the right middle and right lower lobes.  Persistent probable area of rounded atelectasis within the left lower lobe (image 88; series 4). Bandlike consolidation within the medial left upper lobe (image 41; series 4). Patchy opacities within the left lower lobe. No large pleural effusion or pneumothorax. Findings most compatible with prior left hemidiaphragm repair. Upper Abdomen: No acute process. Musculoskeletal: Thoracic spine degenerative changes. IMPRESSION: 1. Narrowing of the left mainstem bronchus with possible soft tissue at the level of narrowing. While findings may be secondary to mucous plugging, intraluminal lesion is not excluded. Consider short-term follow-up chest CT if bronchoscopy is not performed. 2. Patchy ground-glass and consolidative opacities within the right middle and right lower lobes which may represent atelectasis or infection. 3. Bandlike consolidation within the medial left upper lobe which may represent atelectasis. Recommend attention on follow-up. 4. Left upper extremity PICC line tip terminates in the central left brachiocephalic vein. 5. Aortic Atherosclerosis (ICD10-I70.0). Electronically Signed   By: Lovey Newcomer M.D.   On: 04/18/2019 13:31   CT Cervical Spine Wo Contrast  Result Date: 04/03/2019 CLINICAL DATA:  86 year old female status post syncope and fall. EXAM: CT CERVICAL SPINE WITHOUT CONTRAST TECHNIQUE: Multidetector CT imaging of the cervical spine was performed without intravenous contrast. Multiplanar CT image reconstructions were also generated. COMPARISON:  Head CT today reported separately. Chest CT 08/08/2017. FINDINGS: Alignment: Mild reversal of cervical lordosis. Subtle anterolisthesis at C3-C4 and C4-C5 appears to be degenerative in nature with associated facet arthropathy. Cervicothoracic junction alignment is within normal limits. Bilateral posterior element alignment is within normal limits. Skull base and vertebrae: Visualized skull base is intact. No atlanto-occipital dissociation. No acute  osseous abnormality identified. Mild spina bifida occulta at C6 (normal variant). Soft tissues and spinal canal: No prevertebral fluid or swelling. No visible canal hematoma. Negative noncontrast neck soft tissues, partially retropharyngeal carotids. Disc levels: Widespread bilateral cervical facet arthropathy. Comparatively mild chronic disc and endplate degeneration. No significant spinal stenosis suspected. Upper chest: Visible upper thoracic levels appear intact. There is confluent new lung opacity in the left apex on series 4, image 72 when compared to the 2019 chest CT. This does resemble atelectasis. Negative visible superior mediastinum. IMPRESSION: 1. No acute traumatic injury identified in the cervical spine. 2. New nonspecific left apical lung opacity (series 4, image 72)  when compared to a 2019 Chest CT. Query cough. Consider a repeat Chest CT (noncontrast may suffice) in 2-3 months to re-evaluate. Electronically Signed   By: Genevie Ann M.D.   On: 04/07/2019 21:48   DG CHEST PORT 1 VIEW  Result Date: 04/17/2019 CLINICAL DATA:  Shortness of breath. EXAM: PORTABLE CHEST 1 VIEW COMPARISON:  04/12/2019. FINDINGS: Mediastinum and hilar structures are stable. Cardiomegaly. Pulmonary vascular congestion. Prominent left base atelectasis and consolidation. This could be from bronchial obstruction as the left mainstem bronchus appears to be truncated. Prominent left pleural effusion. No pneumothorax. No acute bony abnormality. Surgical clips upper abdomen. IMPRESSION: 1. Left mainstem bronchus truncation with prominent left base atelectasis and consolidation. Moderate left pleural effusion. 2.  Cardiomegaly with pulmonary venous congestion. Electronically Signed   By: Marcello Moores  Register   On: 04/17/2019 08:38   DG Chest Portable 1 View  Result Date: 04/16/2019 CLINICAL DATA:  Shortness of breath, left ankle fracture EXAM: PORTABLE CHEST 1 VIEW COMPARISON:  10/28/2018 FINDINGS: Single frontal view of the chest  demonstrates persistent enlargement of the cardiac silhouette. There is chronic central vascular congestion, with chronic left basilar consolidation unchanged. Small bilateral pleural effusions versus pleural thickening, left greater than right, again noted. No pneumothorax. No acute bony abnormality. IMPRESSION: 1. Chronic vascular congestion. 2. Chronic left basilar consolidation compatible with atelectasis or scarring. 3. Stable small bilateral pleural effusions versus pleural thickening. Electronically Signed   By: Randa Ngo M.D.   On: 04/18/2019 23:12   ECHOCARDIOGRAM COMPLETE  Result Date: 04/17/2019    ECHOCARDIOGRAM REPORT   Patient Name:   SUKAINA TOOTHAKER Date of Exam: 04/17/2019 Medical Rec #:  [id number]       Height:       61.0 in Accession #:    [id number]      Weight:       221.8 lb Date of Birth:  [birth year]       BSA:          1.974 m Patient Age:    56 years        BP:           133/66 mmHg Patient Gender: F               HR:           114 bpm. Exam Location:  [facility] Procedure: 2D Echo Indications:    CHF-Acute Diastolic 048.88 / B16.94  History:        Patient has prior history of Echocardiogram examinations, most                 recent 03/08/2017. CHF, COPD, Arrythmias:Atrial Fibrillation,                 Signs/Symptoms:Chest Pain; Risk Factors:Diabetes. Pulmonary                 hypertension , Chronic respiratory failure.  Sonographer:    Leavy Cella RDCS (AE) Referring Phys: [phone] Quenten Nawaz IMPRESSIONS  1. Left ventricular ejection fraction, by estimation, is 55 to 60%. The left ventricle has normal function. The left ventricle has no regional wall motion abnormalities. Left ventricular diastolic parameters are indeterminate.  2. There is flattening of the ventricular septum in systole consistent with RV pressure overload. . Right ventricular systolic function is mildly reduced. The right ventricular size is moderately enlarged. There is mildly elevated pulmonary artery systolic  pressure.  3. Left atrial size was mild to moderately dilated.  4.  Right atrial size was mildly dilated.  5. The mitral valve is normal in structure. No evidence of mitral valve regurgitation. No evidence of mitral stenosis.  6. The aortic valve is tricuspid. Aortic valve regurgitation is not visualized. No aortic stenosis is present.  7. Indeterminant PASP, inadequate TR jet. FINDINGS  Left Ventricle: Left ventricular ejection fraction, by estimation, is 55 to 60%. The left ventricle has normal function. The left ventricle has no regional wall motion abnormalities. The left ventricular internal cavity size was normal in size. There is  no left ventricular hypertrophy. Left ventricular diastolic parameters are indeterminate. Right Ventricle: There is flattening of the ventricular septum in systole consistent with RV pressure overload. The right ventricular size is moderately enlarged. Right vetricular wall thickness was not assessed. Right ventricular systolic function is mildly reduced. There is mildly elevated pulmonary artery systolic pressure. The tricuspid regurgitant velocity is 2.35 m/s, and with an assumed right atrial pressure of 10 mmHg, the estimated right ventricular systolic pressure is 27.5 mmHg. Left Atrium: Left atrial size was mild to moderately dilated. Right Atrium: Right atrial size was mildly dilated. Pericardium: There is no evidence of pericardial effusion. Mitral Valve: The mitral valve is normal in structure. No evidence of mitral valve regurgitation. No evidence of mitral valve stenosis. Tricuspid Valve: The tricuspid valve is not well visualized. Tricuspid valve regurgitation is trivial. No evidence of tricuspid stenosis. Aortic Valve: The aortic valve is tricuspid. Aortic valve regurgitation is not visualized. No aortic stenosis is present. Aortic valve mean gradient measures 4.7 mmHg. Aortic valve peak gradient measures 10.5 mmHg. Aortic valve area, by VTI measures 2.37  cm. Pulmonic  Valve: The pulmonic valve was not well visualized. Pulmonic valve regurgitation is not visualized. No evidence of pulmonic stenosis. Aorta: The aortic root is normal in size and structure. Pulmonary Artery: Indeterminant PASP, inadequate TR jet. Venous: The inferior vena cava was not well visualized. IAS/Shunts: The interatrial septum was not well visualized.  LEFT VENTRICLE PLAX 2D LVIDd:         4.78 cm  Diastology LVIDs:         3.63 cm  LV e' lateral:   10.00 cm/s LV PW:         0.94 cm  LV E/e' lateral: 8.3 LV IVS:        0.75 cm  LV e' medial:    5.66 cm/s LVOT diam:     1.90 cm  LV E/e' medial:  14.6 LV SV:         62 LV SV Index:   32 LVOT Area:     2.84 cm  RIGHT VENTRICLE RV S prime:     10.40 cm/s TAPSE (M-mode): 1.3 cm LEFT ATRIUM             Index LA diam:        3.70 cm 1.87 cm/m LA Vol (A2C):   80.9 ml 40.99 ml/m LA Vol (A4C):   56.4 ml 28.58 ml/m LA Biplane Vol: 67.9 ml 34.40 ml/m  AORTIC VALVE AV Area (Vmax):    1.67 cm AV Area (Vmean):   1.79 cm AV Area (VTI):     2.37 cm AV Vmax:           161.68 cm/s AV Vmean:          99.684 cm/s AV VTI:            0.263 m AV Peak Grad:      10.5 mmHg AV Mean Grad:  4.7 mmHg LVOT Vmax:         94.98 cm/s LVOT Vmean:        63.023 cm/s LVOT VTI:          0.220 m LVOT/AV VTI ratio: 0.84  AORTA Ao Root diam: 2.50 cm MITRAL VALVE               TRICUSPID VALVE MV Area (PHT): 5.02 cm    TR Peak grad:   22.1 mmHg MV Decel Time: 151 msec    TR Vmax:        235.00 cm/s MV E velocity: 82.70 cm/s MV A velocity: 81.00 cm/s  SHUNTS MV E/A ratio:  1.02        Systemic VTI:  0.22 m                            Systemic Diam: 1.90 cm Carlyle Dolly MD Electronically signed by Carlyle Dolly MD Signature Date/Time: 04/17/2019/11:23:36 AM    Final    [place] EKG SITE RITE  Result Date: 04/17/2019 If Site Rite image not attached, placement could not be confirmed due to current cardiac rhythm.   Microbiology Recent Results (from the past 240 hour(s))  SARS  CORONAVIRUS 2 (Ettel Albergo 6-24 HRS) Nasopharyngeal Nasopharyngeal Swab     Status: None   Collection Time: 04/14/2019  7:54 PM   Specimen: Nasopharyngeal Swab  Result Value Ref Range Status   SARS Coronavirus 2 NEGATIVE NEGATIVE Final    Comment: (NOTE) SARS-CoV-2 target nucleic acids are NOT DETECTED. The SARS-CoV-2 RNA is generally detectable in upper and lower respiratory specimens during the acute phase of infection. Negative results do not preclude SARS-CoV-2 infection, do not rule out co-infections with other pathogens, and should not be used as the sole basis for treatment or other patient management decisions. Negative results must be combined with clinical observations, patient history, and epidemiological information. The expected result is Negative. Fact Sheet for Patients: SugarRoll.be Fact Sheet for Healthcare Providers: https://www.woods-mathews.com/ This test is not yet approved or cleared by the [place] FDA and  has been authorized for detection and/or diagnosis of SARS-CoV-2 by FDA under an Emergency Use Authorization (EUA). This EUA will remain  in effect (meaning this test can be used) for the duration of the COVID-19 declaration under Section 56 4(b)(1) of the Act, 21 U.S.C. section 360bbb-3(b)(1), unless the authorization is terminated or revoked sooner. Performed at [facility], [place] [address] [place], [place] [address]   Respiratory Panel by RT PCR (Flu A&B, Covid) - Nasopharyngeal Swab     Status: None   Collection Time: 04/08/2019 10:36 PM   Specimen: Nasopharyngeal Swab  Result Value Ref Range Status   SARS Coronavirus 2 by RT PCR NEGATIVE NEGATIVE Final    Comment: (NOTE) SARS-CoV-2 target nucleic acids are NOT DETECTED. The SARS-CoV-2 RNA is generally detectable in upper respiratoy specimens during the acute phase of infection. The lowest concentration of SARS-CoV-2 viral copies this assay can detect is 131  copies/mL. A negative result does not preclude SARS-Cov-2 infection and should not be used as the sole basis for treatment or other patient management decisions. A negative result may occur with  improper specimen collection/handling, submission of specimen other than nasopharyngeal swab, presence of viral mutation(s) within the areas targeted by this assay, and inadequate number of viral copies (<131 copies/mL). A negative result must be combined with clinical observations, patient history, and epidemiological information. The expected  result is Negative. Fact Sheet for Patients:  PinkCheek.be Fact Sheet for Healthcare Providers:  GravelBags.it This test is not yet ap proved or cleared by the [place] FDA and  has been authorized for detection and/or diagnosis of SARS-CoV-2 by FDA under an Emergency Use Authorization (EUA). This EUA will remain  in effect (meaning this test can be used) for the duration of the COVID-19 declaration under Section 564(b)(1) of the Act, 21 U.S.C. section 360bbb-3(b)(1), unless the authorization is terminated or revoked sooner.    Influenza A by PCR NEGATIVE NEGATIVE Final   Influenza B by PCR NEGATIVE NEGATIVE Final    Comment: (NOTE) The Xpert Xpress SARS-CoV-2/FLU/RSV assay is intended as an aid in  the diagnosis of influenza from Nasopharyngeal swab specimens and  should not be used as a sole basis for treatment. Nasal washings and  aspirates are unacceptable for Xpert Xpress SARS-CoV-2/FLU/RSV  testing. Fact Sheet for Patients: PinkCheek.be Fact Sheet for Healthcare Providers: GravelBags.it This test is not yet approved or cleared by the [place] FDA and  has been authorized for detection and/or diagnosis of SARS-CoV-2 by  FDA under an Emergency Use Authorization (EUA). This EUA will remain  in effect (meaning this test can  be used) for the duration of the  Covid-19 declaration under Section 564(b)(1) of the Act, 21  U.S.C. section 360bbb-3(b)(1), unless the authorization is  terminated or revoked. Performed at [facility], [address], [place], [place] [address]   Culture, blood (Routine X 2) w Reflex to ID Panel     Status: None   Collection Time: 04/16/19  9:49 AM   Specimen: BLOOD LEFT HAND  Result Value Ref Range Status   Specimen Description BLOOD LEFT HAND  Final   Special Requests   Final    BOTTLES DRAWN AEROBIC AND ANAEROBIC Blood Culture results may not be optimal due to an inadequate volume of blood received in culture bottles   Culture   Final    NO GROWTH 5 DAYS Performed at [facility], [address], [place], [place] [address]    Report Status 05-02-2019 FINAL  Final  MRSA PCR Screening     Status: None   Collection Time: 04/16/19 10:34 AM   Specimen: Nasopharyngeal  Result Value Ref Range Status   MRSA by PCR NEGATIVE NEGATIVE Final    Comment:        The GeneXpert MRSA Assay (FDA approved for NASAL specimens only), is one component of a comprehensive MRSA colonization surveillance program. It is not intended to diagnose MRSA infection nor to guide or monitor treatment for MRSA infections. Performed at [facility], [address] [place], [place] [address]   Culture, Urine     Status: None   Collection Time: 04/16/19 12:24 PM   Specimen: Urine, Random  Result Value Ref Range Status   Specimen Description   Final    URINE, RANDOM Performed at [facility], [address], [place], [place] [address]    Special Requests   Final    NONE Performed at [facility], [address], [place], [place] [address]    Culture   Final    NO GROWTH Performed at [facility], [place] [address] [place], [place] [address]    Report Status 04/17/2019 FINAL  Final    Lab Basic Metabolic Panel: Recent Labs  Lab 04/17/19 0434 04/18/19 0545 04/19/19 0452 04/20/19 0440  05/02/2019 0413  NA 146* 148* 148* 153* 156*  K 4.9 4.4  4.0 4.4 4.6  CL 101 101 101 105 109  CO2 33* 35* 33* 36* 35*  GLUCOSE 166* 187* 166* 207* 174*  BUN 40* 52* 42* 49* 60*  CREATININE 1.40* 1.31* 1.02* 1.23* 1.32*  CALCIUM 9.3 9.3 9.3 9.6 9.7  MG 1.9  --  2.2  --  2.6*   Liver Function Tests: Recent Labs  Lab 04/12/2019 1853 04/17/19 0434 04/18/19 0545 04/19/19 0452 04/20/19 0440  AST 12* _0 ALT _1 ALKPHOS 91 92 78 74 73  BILITOT 0.4 0.8 0.8 0.9 1.0  PROT 6.5 6.1* 5.8* 6.0* 6.2*  ALBUMIN 3.2* 3.0* 3.0* 3.0* 3.1*   No results for input(s): LIPASE, AMYLASE in the last 168 hours. No results for input(s): AMMONIA in the last 168 hours. CBC: Recent Labs  Lab 04/03/2019 1853 04/16/19 0313 04/17/19 0712 04/18/19 0545 04/19/19 0452 04/20/19 0440 30-Apr-2019 0413  WBC 13.6*   < > 14.9* 13.4* 11.6* 12.8* 13.3*  NEUTROABS 11.3*  --  12.6*  --  9.7* 11.0*  --   HGB 9.6*   < > 8.2* 7.4* 7.9* 8.4* 8.9*  HCT 36.3   < > 29.2* 25.7* 28.0* 31.2* 33.8*  MCV 91.7   < > 87.4 85.4 87.8 91.0 93.4  PLT 279   < > 248 240 214 256 248   < > = values in this interval not displayed.   Cardiac Enzymes: No results for input(s): CKTOTAL, CKMB, CKMBINDEX, TROPONINI in the last 168 hours. Sepsis Labs: Recent Labs  Lab 04/16/19 0751 04/17/19 0434 04/17/19 0712 04/17/19 0712 04/18/19 0545 04/19/19 0452 04/20/19 0440 April 30, 2019 0413  PROCALCITON <0.10  --  <0.10  --   --  <0.10  --   --   WBC  --    < > 14.9*   < > 13.4* 11.6* 12.8* 13.3*   < > = values in this interval not displayed.    Procedures/Operations  PICC   Jessa Stinson Apr 30, 2019, 6:56 PM

## 2019-04-23 NOTE — Progress Notes (Signed)
Palliative:  HPI: 86 y.o. female  with past medical history of COPD on 6L oxygen at home, atrial fibrillation, diastolic CHF, OSA, diabetes admitted on 04/16/2019 with fall and left ankle fracture after recent oxycodone and Robaxin for right hip/leg pain. Recommendation was for transfer to [facility] for reduction and splint placement but she became hypoxic and required BiPAP in ED with hypoventilation, COPD, fluid overload, and possible pneumonia. Transfer to [facility] was cancelled. Tenuous course with recurrent hypoxia, confusion, agitation. BiPAP has been discontinued at family request. Family considering hospice care.    I met today at Ms. Royse bedside with her husband. He is happy that she is more comfortable and resting today. I told him that I feel better as she was so anxious and her breathing was too labored when I visited yesterday so this is much better.    He tells me that he went by hospice facility and was pleased with the rooms and spoke with the staff. They are interested in getting her transitioned to hospice facility today if possible. He wants her comfortable and knows that he would not be able to care for her like this at home. Other family have been able to visit with her as well. We discussed that hospice usually focuses on comfort medications only and that these can be given just a few drops or solution under the tongue or in cheek. Hospice is very good at ensuring comfort. He understands that she cannot take her regular medications and we discussed that they are of limited benefit to her at this stage and that the medications for comfort will help her much more.   All questions/concerns addressed. Emotional support provided. Discussed with RN and CSW.   Exam: Resting comfortably on NRB. No distress. HR regular rate. Breathing regular, unlabored, no apnea noted. Abd soft.   Plan: - Transition to hospice facility for comfort care.  - Recommend ongoing spiritual care support.    25 min  Vinie Sill, NP Palliative Medicine Team Pager [phone] (Please see [email or web address] for schedule) Team Phone [phone]    Greater than 50%  of this time was spent counseling and coordinating care related to the above assessment and plan

## 2019-04-23 NOTE — Progress Notes (Signed)
Pt has been resting peacefully all morning with spouse at bedside.Spouse did not want staff to reposition patient as she was resting well. Pt transitioned to mostly comfort care orders via Palliative care team. Vitals screen changed to comfort mode, patient spouse did not wish to see oxygen levels or focus too much on numbers. Vitals can still be seen at nursing station. Will continue to monitor and reassess patient's comfort as needed.

## 2019-04-23 NOTE — Progress Notes (Signed)
Full release by [facility] per Lesia Sago at [facility].

## 2019-04-23 NOTE — Progress Notes (Signed)
Pt has not had any output in foley tonight. Bed pad was wet so it was changed. I bladder scanned pt and got 9m. Balloon inflated and repositioned. Gave pt 12.5 mg of fentanyl X2 threw out the night. This seems to have really helped with pain and agitation as evidenced by BP, HR, and work of breathing.

## 2019-04-23 DEATH — deceased

## 2019-05-13 ENCOUNTER — Telehealth: Payer: Self-pay | Admitting: Critical Care Medicine

## 2019-05-13 ENCOUNTER — Encounter: Payer: Self-pay | Admitting: Critical Care Medicine

## 2019-05-13 NOTE — Telephone Encounter (Signed)
I called Hassan Rowan at [employer] and advised her that pt was deceased. She had some questions about when the patient was prescribed Proventil and if she had an adverse reactions prior to her death. Nothing further is needed.

## 2019-05-20 NOTE — Telephone Encounter (Signed)
Spoke to [place] and informed her that the pt is not testing.  She is deceased.  Nothing further needed.

## 2019-05-20 NOTE — Telephone Encounter (Signed)
[facility] supply called to find out the  number of glucose test per day?   Phone: [phone]   I was not sure if I was allowed to inform them that the pt is deceased so sending note back instead.

## 2019-06-23 ENCOUNTER — Other Ambulatory Visit: Payer: Self-pay | Admitting: Critical Care Medicine

## 2019-07-02 ENCOUNTER — Ambulatory Visit: Payer: Medicare HMO | Admitting: Adult Health

## 2019-11-16 IMAGING — CR DG CHEST 2V
3 series · 3 of 3 positions shown · non-contrast
Comparison: Chest x-ray of 03/08/2017

CLINICAL DATA: Cough for several days, shallow breathing

EXAM:
CHEST - 2 VIEW

[w chest pa]
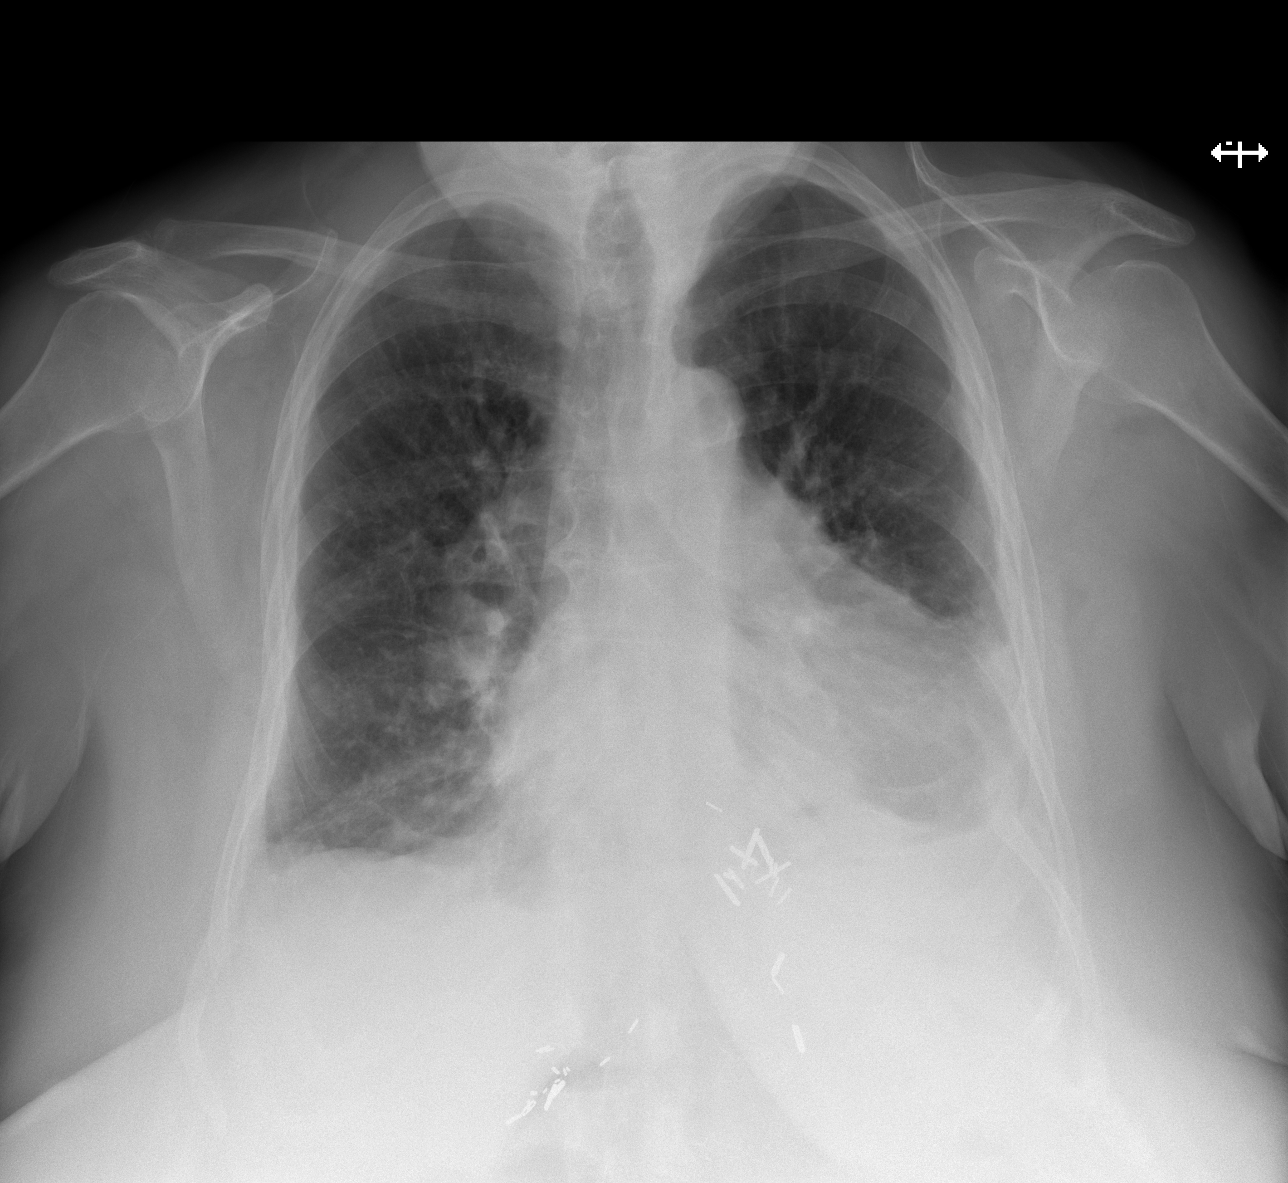

[w chest lat]
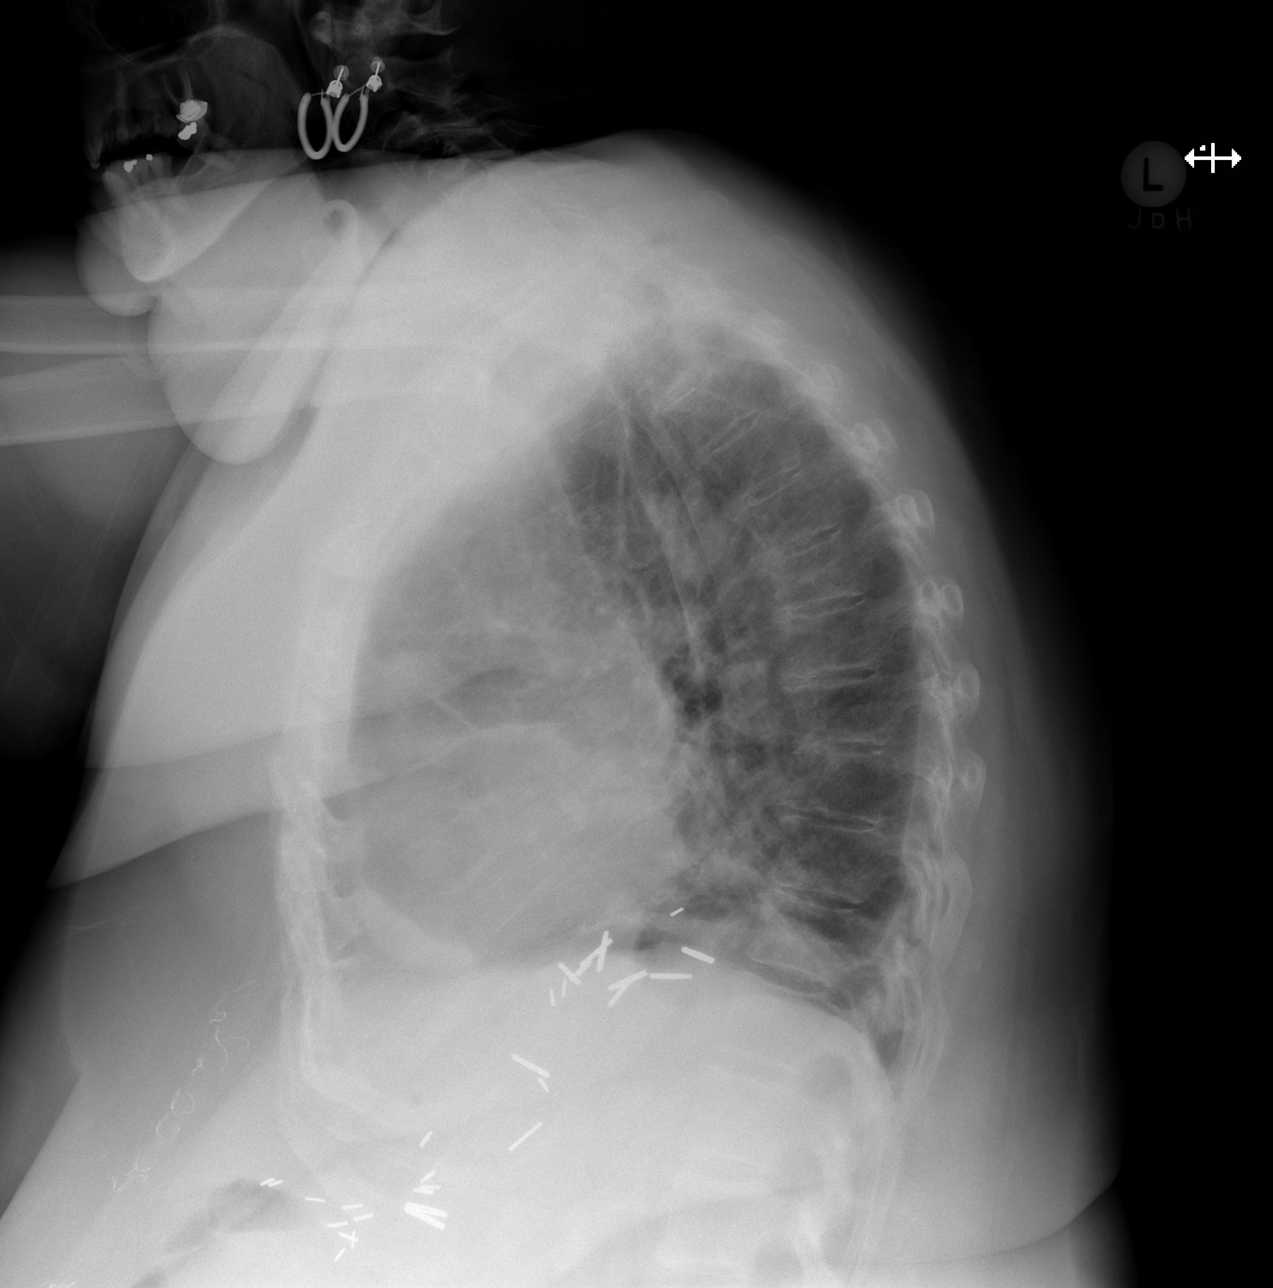

[w chest ap 4-7yrs (14-20cm)]
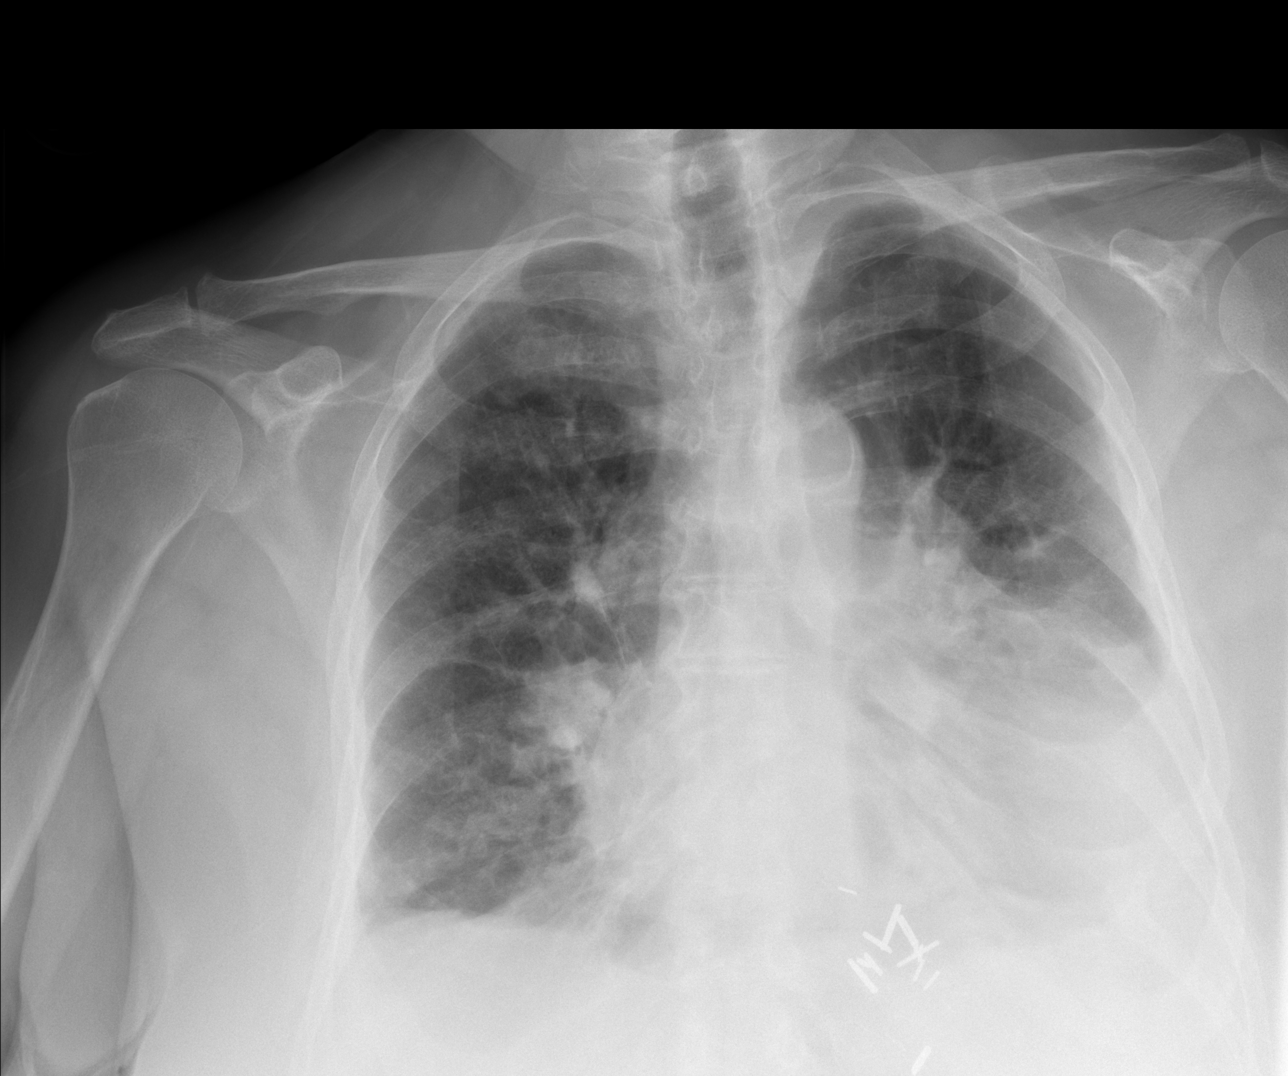

[3 of 3 positions shown; findings below may reference images not displayed]

FINDINGS: The predominance of opacities at the left lung base particularly
medially appear chronic in nature and consistent with scarring.
Blunting of the left costophrenic angle also appears stable
consistent with pleural thickening. No definite new opacity is seen.
The right lung is clear. Cardiomegaly is stable. No acute bony
abnormality is seen.
IMPRESSION: Stable chronic changes particularly at the left lung base. No
definite active process.
